# Patient Record
Sex: Male | Born: 1985
Health system: Southern US, Community
[De-identification: ages and names within clinical notes are randomized; demographics above are authoritative.]

## PROBLEM LIST (undated history)

## (undated) ENCOUNTER — Emergency Department (HOSPITAL_BASED_OUTPATIENT_CLINIC_OR_DEPARTMENT_OTHER): Admission: EM | Discharge status: Absent without leave | Payer: Self-pay | Source: Home / Self Care

## (undated) DIAGNOSIS — R079 Chest pain, unspecified: Secondary | ICD-10-CM

## (undated) DIAGNOSIS — R0602 Shortness of breath: Secondary | ICD-10-CM

## (undated) DIAGNOSIS — E119 Type 2 diabetes mellitus without complications: Secondary | ICD-10-CM

## (undated) DIAGNOSIS — F419 Anxiety disorder, unspecified: Secondary | ICD-10-CM

## (undated) DIAGNOSIS — G4700 Insomnia, unspecified: Secondary | ICD-10-CM

## (undated) DIAGNOSIS — E7800 Pure hypercholesterolemia, unspecified: Secondary | ICD-10-CM

## (undated) HISTORY — DX: Insomnia, unspecified: G47.00

## (undated) HISTORY — DX: Shortness of breath: R06.02

## (undated) HISTORY — PX: NO PAST SURGERIES: SHX2092

## (undated) HISTORY — DX: Chest pain, unspecified: R07.9

---

## 2012-11-10 ENCOUNTER — Encounter (HOSPITAL_BASED_OUTPATIENT_CLINIC_OR_DEPARTMENT_OTHER): Payer: Self-pay | Admitting: Family Medicine

## 2012-11-10 ENCOUNTER — Emergency Department (HOSPITAL_BASED_OUTPATIENT_CLINIC_OR_DEPARTMENT_OTHER)
Admission: EM | Admit: 2012-11-10 | Discharge: 2012-11-10 | Disposition: A | Discharge status: Home / Self Care | Payer: BC Managed Care – PPO | Attending: Emergency Medicine | Admitting: Emergency Medicine

## 2012-11-10 DIAGNOSIS — R0789 Other chest pain: Secondary | ICD-10-CM | POA: Insufficient documentation

## 2012-11-10 DIAGNOSIS — F17200 Nicotine dependence, unspecified, uncomplicated: Secondary | ICD-10-CM | POA: Insufficient documentation

## 2012-11-10 DIAGNOSIS — F419 Anxiety disorder, unspecified: Secondary | ICD-10-CM

## 2012-11-10 DIAGNOSIS — F411 Generalized anxiety disorder: Secondary | ICD-10-CM | POA: Insufficient documentation

## 2012-11-10 DIAGNOSIS — Z8639 Personal history of other endocrine, nutritional and metabolic disease: Secondary | ICD-10-CM | POA: Insufficient documentation

## 2012-11-10 DIAGNOSIS — Z862 Personal history of diseases of the blood and blood-forming organs and certain disorders involving the immune mechanism: Secondary | ICD-10-CM | POA: Insufficient documentation

## 2012-11-10 HISTORY — DX: Pure hypercholesterolemia, unspecified: E78.00

## 2012-11-10 HISTORY — DX: Anxiety disorder, unspecified: F41.9

## 2012-11-10 MED ORDER — LORAZEPAM 1 MG PO TABS: 1.0000 mg | ORAL_TABLET | Freq: Three times a day (TID) | ORAL | Status: DC | PRN

## 2012-11-10 MED ORDER — LORAZEPAM 1 MG PO TABS
1.0000 mg | ORAL_TABLET | Freq: Once | ORAL | Status: AC
Start: 2012-11-10 — End: 2012-11-10
  Administered 2012-11-10: 1 mg via ORAL
  Filled 2012-11-10: qty 1

## 2012-11-10 NOTE — ED Notes (Signed)
Pt sts "I felt like I was having a panic attack at work" then c/o chest tightness and shortness of breath. Pt sts he is out of anxiety med. Pt talking in complete sentences, nad noted.

## 2012-11-10 NOTE — ED Provider Notes (Signed)
History    CSN: [id number] Arrival date & time 11/10/12  1144  First MD Initiated Contact with Patient 11/10/12 1216     Chief Complaint  Patient presents with  . Shortness of Breath   (Consider location/radiation/quality/duration/timing/severity/associated sxs/prior Treatment) HPI Comments: Patient is a 27 year old male with history of high cholesterol and anxiety who presents today with one month of intermittent shortness of breath and chest tightness. He cannot identify any triggers for his shortness of breath and chest tightness. When it comes it usually lasts for approximately 30 minutes and resolve spontaneously. Day he was at work and his boss made him sit down. This improved his symptoms. He still complains of the sensation of chest heaviness. He has tried a course of antidepressants for 4 months and states he was feeling better, but never picked up his new prescription. No recent surgeries, long trips, history of cancer, he is not on any hormonal supplements, no prior history of DVT or PE.  The history is provided by the patient. No language interpreter was used.   Past Medical History  Diagnosis Date  . High cholesterol   . Anxiety    History reviewed. No pertinent past surgical history. No family history on file. History  Substance Use Topics  . Smoking status: Current Some Day Smoker  . Smokeless tobacco: Not on file  . Alcohol Use: Yes    Review of Systems  Constitutional: Negative for fever, chills and diaphoresis.  Respiratory: Positive for chest tightness and shortness of breath.   Cardiovascular: Negative for chest pain and leg swelling.  Gastrointestinal: Negative for nausea, vomiting and abdominal pain.  Psychiatric/Behavioral: The patient is nervous/anxious.   All other systems reviewed and are negative.    Allergies  Review of patient's allergies indicates no known allergies.  Home Medications   Current Outpatient Rx  Name  Route  Sig  Dispense   Refill  . ALPRAZOLAM PO   Oral   Take by mouth.          BP 138/82  Pulse 68  Temp(Src) 98.1 F (36.7 C) (Oral)  Resp 18  Ht 6' (1.829 m)  Wt 250 lb (113.399 kg)  BMI 33.9 kg/m2  SpO2 100% Physical Exam  Nursing note and vitals reviewed. Constitutional: He is oriented to person, place, and time. He appears well-developed and well-nourished. No distress.  HENT:  Head: Normocephalic and atraumatic.  Right Ear: External ear normal.  Left Ear: External ear normal.  Nose: Nose normal.  Eyes: Conjunctivae are normal.  Neck: Normal range of motion. No tracheal deviation present.  Cardiovascular: Normal rate, regular rhythm and normal heart sounds.   Pulmonary/Chest: Effort normal and breath sounds normal. No stridor.  Abdominal: Soft. He exhibits no distension. There is no tenderness.  Musculoskeletal: Normal range of motion.  Neurological: He is alert and oriented to person, place, and time.  Skin: Skin is warm and dry. He is not diaphoretic.  Psychiatric: He has a normal mood and affect. His behavior is normal.    ED Course  Procedures (including critical care time) Labs Reviewed - No data to display No results found.  Symptoms have improved, but not resolved after ativan.   1. Anxiety     MDM  Patient presents to the emergency department complaining of symptoms consistent with anxiety.  Patient has a history of same with similar episodes.  The patient is resting comfortably, in no apparent distress and symptoms have improved.  Labs, ECG and vital signs reviewed.  Stress reducing mechanisms discussed including caffeine intake.  Patient has been referred to psychiatric services for follow-up.  Discharged with a 3 day prescription for Ativan 1mg .  PERC negative. Discussed case with Dr. Bernette Mayers who agrees with plan. Patient / Family / Caregiver informed of clinical course, understand medical decision-making process, and agree with plan.      Mora Bellman,  PA-C 11/10/12 1330

## 2012-11-12 NOTE — ED Provider Notes (Signed)
Medical screening examination/treatment/procedure(s) were performed by non-physician practitioner and as supervising physician I was immediately available for consultation/collaboration.   Perry Ivanov B. Bernette Mayers, MD 11/12/12 1945

## 2012-11-16 ENCOUNTER — Encounter (HOSPITAL_BASED_OUTPATIENT_CLINIC_OR_DEPARTMENT_OTHER): Payer: Self-pay | Admitting: *Deleted

## 2012-11-16 ENCOUNTER — Emergency Department (HOSPITAL_BASED_OUTPATIENT_CLINIC_OR_DEPARTMENT_OTHER)
Admission: EM | Admit: 2012-11-16 | Discharge: 2012-11-16 | Disposition: A | Discharge status: Home / Self Care | Payer: BC Managed Care – PPO | Attending: Emergency Medicine | Admitting: Emergency Medicine

## 2012-11-16 DIAGNOSIS — R0602 Shortness of breath: Secondary | ICD-10-CM | POA: Insufficient documentation

## 2012-11-16 DIAGNOSIS — Z862 Personal history of diseases of the blood and blood-forming organs and certain disorders involving the immune mechanism: Secondary | ICD-10-CM | POA: Insufficient documentation

## 2012-11-16 DIAGNOSIS — F17200 Nicotine dependence, unspecified, uncomplicated: Secondary | ICD-10-CM | POA: Insufficient documentation

## 2012-11-16 DIAGNOSIS — Z8639 Personal history of other endocrine, nutritional and metabolic disease: Secondary | ICD-10-CM | POA: Insufficient documentation

## 2012-11-16 DIAGNOSIS — Z79899 Other long term (current) drug therapy: Secondary | ICD-10-CM | POA: Insufficient documentation

## 2012-11-16 DIAGNOSIS — R079 Chest pain, unspecified: Secondary | ICD-10-CM | POA: Insufficient documentation

## 2012-11-16 DIAGNOSIS — F419 Anxiety disorder, unspecified: Secondary | ICD-10-CM

## 2012-11-16 DIAGNOSIS — F411 Generalized anxiety disorder: Secondary | ICD-10-CM | POA: Insufficient documentation

## 2012-11-16 LAB — CBC WITH DIFFERENTIAL/PLATELET
Basophils Absolute: 0 10*3/uL (ref 0.0–0.1)
Basophils Relative: 0 % (ref 0–1)
Eosinophils Absolute: 0.5 10*3/uL (ref 0.0–0.7)
MCH: 30.1 pg (ref 26.0–34.0)
MCHC: 34.7 g/dL (ref 30.0–36.0)
Neutro Abs: 4.6 10*3/uL (ref 1.7–7.7)
Neutrophils Relative %: 52 % (ref 43–77)
Platelets: 253 10*3/uL (ref 150–400)
RDW: 11.6 % (ref 11.5–15.5)

## 2012-11-16 LAB — COMPREHENSIVE METABOLIC PANEL
AST: 20 U/L (ref 0–37)
Albumin: 4.2 g/dL (ref 3.5–5.2)
Alkaline Phosphatase: 84 U/L (ref 39–117)
BUN: 17 mg/dL (ref 6–23)
Chloride: 103 mEq/L (ref 96–112)
Creatinine, Ser: 1 mg/dL (ref 0.50–1.35)
Potassium: 4.2 mEq/L (ref 3.5–5.1)
Total Bilirubin: 0.4 mg/dL (ref 0.3–1.2)
Total Protein: 7.3 g/dL (ref 6.0–8.3)

## 2012-11-16 LAB — TROPONIN I: Troponin I: <0.3 ng/mL (ref <0.30)

## 2012-11-16 LAB — CK: Total CK: 211 U/L (ref 7–232)

## 2012-11-16 MED ORDER — ALPRAZOLAM 1 MG PO TABS: 1.0000 mg | ORAL_TABLET | Freq: Three times a day (TID) | ORAL | Status: DC | PRN

## 2012-11-16 NOTE — ED Provider Notes (Signed)
History    CSN: [id number] Arrival date & time 11/16/12  0002  First MD Initiated Contact with Patient 11/16/12 0124     Chief Complaint  Patient presents with  . Anxiety   (Consider location/radiation/quality/duration/timing/severity/associated sxs/prior Treatment) HPI This is a 27 year old male with a history of anxiety. He had previously been on an antidepressant for several months but has not taken this since March. He was seen here 6 days ago with complaints of anxiety, insomnia and intermittent chest pain and shortness of breath. He was diagnosed with anxiety and was discharged on Ativan. He states the Ativan has not improved his symptoms.   He is here now because he states that whenever he lies down to sleep he feels like his entire body is crumpling up. He states the symptoms are moderate to severe. This crumpling sensation also occurs when he yawns primarily located in the chest which she states feels like it is collapsing. He continues to have intermittent chest pain and shortness of breath. He states he is having some left-sided chest pain at this time, worse with palpation. He denies feeling depressed. He was previously on a cholesterol medicine but stopped taking it. He started taking it again 2 days ago.  He denies paresthesias, nausea, vomiting or diarrhea.  Past Medical History  Diagnosis Date  . High cholesterol   . Anxiety    History reviewed. No pertinent past surgical history. History reviewed. No pertinent family history. History  Substance Use Topics  . Smoking status: Current Some Day Smoker  . Smokeless tobacco: Not on file  . Alcohol Use: Yes    Review of Systems  All other systems reviewed and are negative.    Allergies  Review of patient's allergies indicates no known allergies.  Home Medications   Current Outpatient Rx  Name  Route  Sig  Dispense  Refill  . ALPRAZOLAM PO   Oral   Take by mouth.         Marland Kitchen LORazepam (ATIVAN) 1 MG tablet    Oral   Take 1 tablet (1 mg total) by mouth 3 (three) times daily as needed for anxiety.   15 tablet   0    BP 136/82  Pulse 75  Temp(Src) 98.1 F (36.7 C)  Resp 16  Ht 6' (1.829 m)  Wt 250 lb (113.399 kg)  BMI 33.9 kg/m2  SpO2 99%  Physical Exam General: Well-developed, well-nourished male in no acute distress; appearance consistent with age of record HENT: normocephalic, atraumatic Eyes: pupils equal round and reactive to light; extraocular muscles intact Neck: supple Heart: regular rate and rhythm Lungs: clear to auscultation bilaterally Chest: Left upper chest wall tenderness Abdomen: soft; nondistended; nontender Extremities: No deformity; full range of motion; pulses normal Neurologic: Awake, alert and oriented; motor function intact in all extremities and symmetric; no facial droop Skin: Warm and dry Psychiatric: Flat affect    ED Course  Procedures (including critical care time)   MDM   Nursing notes and vitals signs, including pulse oximetry, reviewed.  Summary of this visit's results, reviewed by myself:  Labs:  Results for orders placed during the hospital encounter of 11/16/12 (from the past 24 hour(s))  CBC WITH DIFFERENTIAL     Status: Abnormal   Collection Time    11/16/12  1:51 AM      Result Value Range   WBC 8.8  4.0 - 10.5 K/uL   RBC 5.15  4.22 - 5.81 MIL/uL   Hemoglobin 15.5  13.0 - 17.0 g/dL   HCT 16.1  09.6 - 04.5 %   MCV 86.8  78.0 - 100.0 fL   MCH 30.1  26.0 - 34.0 pg   MCHC 34.7  30.0 - 36.0 g/dL   RDW 40.9  81.1 - 91.4 %   Platelets 253  150 - 400 K/uL   Neutrophils Relative % 52  43 - 77 %   Neutro Abs 4.6  1.7 - 7.7 K/uL   Lymphocytes Relative 34  12 - 46 %   Lymphs Abs 3.0  0.7 - 4.0 K/uL   Monocytes Relative 8  3 - 12 %   Monocytes Absolute 0.7  0.1 - 1.0 K/uL   Eosinophils Relative 6 (*) 0 - 5 %   Eosinophils Absolute 0.5  0.0 - 0.7 K/uL   Basophils Relative 0  0 - 1 %   Basophils Absolute 0.0  0.0 - 0.1 K/uL   COMPREHENSIVE METABOLIC PANEL     Status: None   Collection Time    11/16/12  1:51 AM      Result Value Range   Sodium 139  135 - 145 mEq/L   Potassium 4.2  3.5 - 5.1 mEq/L   Chloride 103  96 - 112 mEq/L   CO2 26  19 - 32 mEq/L   Glucose, Bld 95  70 - 99 mg/dL   BUN 17  6 - 23 mg/dL   Creatinine, Ser 7.82  0.50 - 1.35 mg/dL   Calcium 9.9  8.4 - 95.6 mg/dL   Total Protein 7.3  6.0 - 8.3 g/dL   Albumin 4.2  3.5 - 5.2 g/dL   AST 20  0 - 37 U/L   ALT 30  0 - 53 U/L   Alkaline Phosphatase 84  39 - 117 U/L   Total Bilirubin 0.4  0.3 - 1.2 mg/dL   GFR calc non Af Amer >90  >90 mL/min   GFR calc Af Amer >90  >90 mL/min  CK     Status: None   Collection Time    11/16/12  1:51 AM      Result Value Range   Total CK 211  7 - 232 U/L  TROPONIN I     Status: None   Collection Time    11/16/12  1:51 AM      Result Value Range   Troponin I <0.30  <0.30 ng/mL    Date: 11/16/2012 2:00 AM  Rate: 61  Rhythm: normal sinus rhythm  QRS Axis: normal  Intervals: normal  ST/T Wave abnormalities: normal  Conduction Disutrbances: none  Narrative Interpretation: unremarkable  Comparison with previous EKG: rate is faster      There is no evidence of rhabdomyolysis or hepatic insult due to his anticholesterol medication. He was seen by his primary care physician the day after he was seen here and started on Zoloft. He was advised that Zoloft, like other antidepressants, can take several weeks to start working. He was advised that he needs to follow up with his primary care physician as the ED has only limited ability to treat anxiety.   Hanley Seamen, MD 11/16/12 [phone]

## 2012-11-16 NOTE — ED Notes (Signed)
MD at bedside. 

## 2012-11-16 NOTE — ED Notes (Signed)
Pt seen here last week for same DX anxiety , return today for same, nervous , insomnia and chest tightness with SOB

## 2012-11-16 NOTE — ED Notes (Signed)
Upon entering room to DC pt. Pt was sleeping soundly but awakened to voice.

## 2012-12-14 ENCOUNTER — Encounter (HOSPITAL_BASED_OUTPATIENT_CLINIC_OR_DEPARTMENT_OTHER): Payer: Self-pay | Admitting: Student

## 2012-12-14 ENCOUNTER — Emergency Department (HOSPITAL_BASED_OUTPATIENT_CLINIC_OR_DEPARTMENT_OTHER)
Admission: EM | Admit: 2012-12-14 | Discharge: 2012-12-14 | Disposition: A | Discharge status: Home / Self Care | Payer: BC Managed Care – PPO | Attending: Emergency Medicine | Admitting: Emergency Medicine

## 2012-12-14 DIAGNOSIS — R0602 Shortness of breath: Secondary | ICD-10-CM | POA: Insufficient documentation

## 2012-12-14 DIAGNOSIS — R110 Nausea: Secondary | ICD-10-CM | POA: Insufficient documentation

## 2012-12-14 DIAGNOSIS — Z79899 Other long term (current) drug therapy: Secondary | ICD-10-CM | POA: Insufficient documentation

## 2012-12-14 DIAGNOSIS — Z862 Personal history of diseases of the blood and blood-forming organs and certain disorders involving the immune mechanism: Secondary | ICD-10-CM | POA: Insufficient documentation

## 2012-12-14 DIAGNOSIS — R079 Chest pain, unspecified: Secondary | ICD-10-CM | POA: Insufficient documentation

## 2012-12-14 DIAGNOSIS — F411 Generalized anxiety disorder: Secondary | ICD-10-CM | POA: Insufficient documentation

## 2012-12-14 DIAGNOSIS — F17200 Nicotine dependence, unspecified, uncomplicated: Secondary | ICD-10-CM | POA: Insufficient documentation

## 2012-12-14 DIAGNOSIS — R42 Dizziness and giddiness: Secondary | ICD-10-CM | POA: Insufficient documentation

## 2012-12-14 DIAGNOSIS — R109 Unspecified abdominal pain: Secondary | ICD-10-CM | POA: Insufficient documentation

## 2012-12-14 DIAGNOSIS — Z8639 Personal history of other endocrine, nutritional and metabolic disease: Secondary | ICD-10-CM | POA: Insufficient documentation

## 2012-12-14 MED ORDER — GI COCKTAIL ~~LOC~~
30.0000 mL | Freq: Once | ORAL | Status: AC
  Administered 2012-12-14: 30 mL via ORAL
  Filled 2012-12-14: qty 30

## 2012-12-14 MED ORDER — OMEPRAZOLE 20 MG PO CPDR: 20.0000 mg | DELAYED_RELEASE_CAPSULE | Freq: Every day | ORAL | Status: DC

## 2012-12-14 NOTE — ED Notes (Signed)
MD at bedside. 

## 2012-12-14 NOTE — ED Provider Notes (Signed)
CSN: [id number]     Arrival date & time 12/14/12  2031 History     First MD Initiated Contact with Patient 12/14/12 2101     Chief Complaint  Patient presents with  . Chest Pain   (Consider location/radiation/quality/duration/timing/severity/associated sxs/prior Treatment) Patient is a 27 y.o. male presenting with chest pain.  Chest Pain Pain location:  L chest Pain quality: burning and pressure   Pain radiates to:  Does not radiate Pain severity:  Moderate Onset quality:  Sudden Duration:  1 hour Timing:  Constant Progression:  Unchanged Chronicity:  Recurrent Context: eating   Relieved by:  Nothing Worsened by:  Nothing tried Associated symptoms: abdominal pain, anxiety, dizziness, nausea and shortness of breath     Past Medical History  Diagnosis Date  . High cholesterol   . Anxiety    History reviewed. No pertinent past surgical history. History reviewed. No pertinent family history. History  Substance Use Topics  . Smoking status: Current Some Day Smoker  . Smokeless tobacco: Not on file  . Alcohol Use: Yes    Review of Systems  Respiratory: Positive for shortness of breath.   Cardiovascular: Positive for chest pain.  Gastrointestinal: Positive for nausea and abdominal pain.  Neurological: Positive for dizziness.  All other systems reviewed and are negative.    Allergies  Review of patient's allergies indicates no known allergies.  Home Medications   Current Outpatient Rx  Name  Route  Sig  Dispense  Refill  . busPIRone (BUSPAR) 10 MG tablet   Oral   Take 10 mg by mouth 2 (two) times daily.         Marland Kitchen ALPRAZolam (XANAX) 1 MG tablet   Oral   Take 1 tablet (1 mg total) by mouth 3 (three) times daily as needed for sleep or anxiety.   30 tablet   0   . ALPRAZOLAM PO   Oral   Take by mouth.         Marland Kitchen LORazepam (ATIVAN) 1 MG tablet   Oral   Take 1 tablet (1 mg total) by mouth 3 (three) times daily as needed for anxiety.   15 tablet   0    . sertraline (ZOLOFT) 50 MG tablet   Oral   Take 50 mg by mouth daily.          There were no vitals taken for this visit. Physical Exam  Nursing note and vitals reviewed. Constitutional: He is oriented to person, place, and time. He appears well-developed and well-nourished. No distress.  HENT:  Head: Normocephalic and atraumatic.  Mouth/Throat: Oropharynx is clear and moist.  Eyes: Conjunctivae are normal. Pupils are equal, round, and reactive to light. No scleral icterus.  Neck: Neck supple.  Cardiovascular: Normal rate, regular rhythm, normal heart sounds and intact distal pulses.   No murmur heard. Pulmonary/Chest: Effort normal and breath sounds normal. No stridor. No respiratory distress. He has no wheezes. He has no rales.  Abdominal: Soft. He exhibits no distension. There is no tenderness.  Musculoskeletal: Normal range of motion. He exhibits no edema.  Neurological: He is alert and oriented to person, place, and time.  Skin: Skin is warm and dry. No rash noted.  Psychiatric: His behavior is normal. His mood appears anxious.    ED Course   Procedures (including critical care time)  Labs Reviewed - No data to display No results found.  EKG - NSR, rate 63, normal axis, normal intervals, no ST/T changes, similar to prior.  1. Chest pain     MDM  27 yo male with hx of anxiety presenting with chest pain which started a few minutes after eating pizza.  Same thing has happened for past two days and each time after a meal.  He appeared anxious.  He has been seen a few times recently for anxiety, has been prescribed xanax from the ED and his PCP.  He thinks they help some, but he doesn't take them all the time.  Well appearing, no distress, but anxious.  Lungs clear.  His chest pain is consistent with GI source.  PERC negative.  EKG normal.  GI cocktail helped some.  DC'd with prescription for omeprazole and PCP follow up.    Candyce Churn, MD 12/15/12 [phone]

## 2012-12-14 NOTE — ED Notes (Signed)
Teaching done with Pt. On working out and taking meds he has been given.  Pt. Also educated on following up with his PMD

## 2012-12-14 NOTE — ED Notes (Signed)
Pt. Able to speak clear full sentences with no distress noted.  Pt. Seems anxious and will con't. To repeat about the Chest pain after eating.  Pt. In no resp. Distress.

## 2012-12-14 NOTE — ED Notes (Signed)
Pt in with c/o central chest pain after eating

## 2012-12-14 NOTE — ED Notes (Signed)
Pt. Reports to RN he is still having "explosive pain in my chest"

## 2012-12-14 NOTE — ED Notes (Signed)
Pt. Lying on stretcher watching TV and still reports he has discomfort.  Pt. In no resp. Distress and able to move the L arm and shoulder WNL.

## 2013-01-16 ENCOUNTER — Encounter: Payer: Self-pay | Admitting: Cardiology

## 2013-01-16 ENCOUNTER — Encounter: Payer: Self-pay | Admitting: *Deleted

## 2013-01-16 DIAGNOSIS — R0602 Shortness of breath: Secondary | ICD-10-CM | POA: Insufficient documentation

## 2013-01-16 DIAGNOSIS — G4700 Insomnia, unspecified: Secondary | ICD-10-CM | POA: Insufficient documentation

## 2013-01-16 DIAGNOSIS — R079 Chest pain, unspecified: Secondary | ICD-10-CM | POA: Insufficient documentation

## 2013-01-16 DIAGNOSIS — E7800 Pure hypercholesterolemia, unspecified: Secondary | ICD-10-CM | POA: Insufficient documentation

## 2013-01-16 DIAGNOSIS — F419 Anxiety disorder, unspecified: Secondary | ICD-10-CM | POA: Insufficient documentation

## 2013-01-17 ENCOUNTER — Ambulatory Visit: Payer: BC Managed Care – PPO | Admitting: Cardiology

## 2014-07-10 ENCOUNTER — Ambulatory Visit (INDEPENDENT_AMBULATORY_CARE_PROVIDER_SITE_OTHER): Payer: BLUE CROSS/BLUE SHIELD

## 2014-07-10 ENCOUNTER — Ambulatory Visit (INDEPENDENT_AMBULATORY_CARE_PROVIDER_SITE_OTHER): Payer: BLUE CROSS/BLUE SHIELD | Admitting: Emergency Medicine

## 2014-07-10 VITALS — BP 130/80 | HR 82 | Temp 98.0°F | Resp 18 | Ht 71.5 in | Wt 252.4 lb

## 2014-07-10 DIAGNOSIS — M79671 Pain in right foot: Secondary | ICD-10-CM

## 2014-07-10 MED ORDER — CLONAZEPAM 1 MG PO TABS: 1.0000 mg | ORAL_TABLET | Freq: Two times a day (BID) | ORAL | Status: DC

## 2014-07-10 MED ORDER — TRAZODONE HCL 150 MG PO TABS: 150.0000 mg | ORAL_TABLET | Freq: Every day | ORAL | Status: DC

## 2014-07-10 NOTE — Patient Instructions (Addendum)
Please call Dr. Bicknell SinkBraden at [phone] for an appointment.  [facility] Policy for Prescribing Controlled Substances (Revised 03/2012) 1. Prescriptions for controlled substances will be filled by ONE provider at [facility] with whom you have established and developed a plan for your care, including follow-up. 2. You are encouraged to schedule an appointment with your prescriber at our appointment center for follow-up visits whenever possible. If you request a prescription for the controlled substance while at [facility] for an acute problem (with someone other than your regular prescriber), you MAY be given a ONE-TIME prescription for a 30-day supply of the controlled substance, to allow time for you to return to see your regular prescriber for additional prescriptions.Achilles Tendinitis Achilles tendinitis is inflammation of the tough, cord-like band that attaches the lower muscles of your leg to your heel (Achilles tendon). It is usually caused by overusing the tendon and joint involved.  CAUSES Achilles tendinitis can happen because of:  A sudden increase in exercise or activity (such as running).  Doing the same exercises or activities (such as jumping) over and over.  Not warming up calf muscles before exercising.  Exercising in shoes that are worn out or not made for exercise.  Having arthritis or a bone growth on the back of the heel bone. This can rub against the tendon and hurt the tendon. SIGNS AND SYMPTOMS The most common symptoms are:  Pain in the back of the leg, just above the heel. The pain usually gets worse with exercise and better with rest.  Stiffness or soreness in the back of the leg, especially in the morning.  Swelling of the skin over the Achilles tendon.  Trouble standing on tiptoe. Sometimes, an Achilles tendon tears (ruptures). Symptoms of an Achilles tendon rupture can include:  Sudden, severe pain in the back of the leg.  Trouble putting weight on the foot or walking  normally. DIAGNOSIS Achilles tendinitis will be diagnosed based on symptoms and a physical examination. An X-ray may be done to check if another condition is causing your symptoms. An MRI may be ordered if your health care provider suspects you may have completely torn your tendon, which is called an Achilles tendon rupture.  TREATMENT  Achilles tendinitis usually gets better over time. It can take weeks to months to heal completely. Treatment focuses on treating the symptoms and helping the injury heal. HOME CARE INSTRUCTIONS   Rest your Achilles tendon and avoid activities that cause pain.  Apply ice to the injured area:  Put ice in a plastic bag.  Place a towel between your skin and the bag.  Leave the ice on for 20 minutes, 2-3 times a day  Try to avoid using the tendon (other than gentle range of motion) while the tendon is painful. Do not resume use until instructed by your health care provider. Then begin use gradually. Do not increase use to the point of pain. If pain does develop, decrease use and continue the above measures. Gradually increase activities that do not cause discomfort until you achieve normal use.  Do exercises to make your calf muscles stronger and more flexible. Your health care provider or physical therapist can recommend exercises for you to do.  Wrap your ankle with an elastic bandage or other wrap. This can help keep your tendon from moving too much. Your health care provider will show you how to wrap your ankle correctly.  Only take over-the-counter or prescription medicines for pain, discomfort, or fever as directed by your health care  provider. SEEK MEDICAL CARE IF:   Your pain and swelling increase or pain is uncontrolled with medicines.  You develop new, unexplained symptoms or your symptoms get worse.  You are unable to move your toes or foot.  You develop warmth and swelling in your foot.  You have an unexplained temperature. MAKE SURE YOU:    Understand these instructions.  Will watch your condition.  Will get help right away if you are not doing well or get worse. Document Released: 01/27/2005 Document Revised: 02/07/2013 Document Reviewed: 11/29/2012 [facility] Patient Information 2015 [place], [place]. This information is not intended to replace advice given to you by your health care provider. Make sure you discuss any questions you have with your health care provider.

## 2014-07-10 NOTE — Progress Notes (Signed)
   This chart was scribed for Collene GobbleSteven A Daub, MD by Tonye RoyaltyJoshua Chen, ED Scribe. This patient was seen in room 14 and the patient's care was started at 12:33 PM.   Subjective:    Patient ID: Perry Li, male    DOB: 03/30/1986, 29 y.o.   MRN: [id number]  HPI  HPI Comments: Perry Li is a 29 y.o. male who presents to the [facility] complaining of ongoing anxiety that began 2 years ago. He states he has been going to General Medicine clinic near here and has also been [place]. He currently only uses Trazodone 150mg  for sleeping, Clonopin 1mg  BID, and prescription medication for nasal congestion. He states he did use Zoloft for a few months but felt like it was not helping. He was also previously on Buspar, Ativan, and Baclofen for headache. He states he had MRI for headache without significant findings. He states he has never seen a therapist of psychiatrist. He states anxiety is triggered at times by certain situations. He states he went to ED twice this year for chest pressure with negative workup and diagnosis of anxiety. He states he has panic attacks. He denies any major event triggering the beginning of his anxiety. He states he often worries about little things. 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 He lives at home; he denies any problems with that but states he would like to move out. He lives with 2 sisters, a nephew, and his parents at home. He states he goes to the gym 4-5 times a week, which improves his anxiety. [place].   He also complains of pain to right heel with onset 1.5 months. He states he has been evaluated for it but did not have x-ray.   Review of Systems  Musculoskeletal:       Right heel pain  Psychiatric/Behavioral: The patient is nervous/anxious.        Objective:   Physical Exam   CONSTITUTIONAL: Well developed/well nourished HEAD: Normocephalic/atraumatic EYES:  EOMI/PERRL ENMT: Mucous membranes moist NECK: supple no meningeal signs, thyroid gland felt normal SPINE/BACK:entire spine nontender CV: S1/S2 noted, no murmurs/rubs/gallops noted LUNGS: Lungs are clear to auscultation bilaterally, no apparent distress ABDOMEN: soft, nontender, no rebound or guarding, bowel sounds noted throughout abdomen GU:no cva tenderness NEURO: Pt is awake/alert/appropriate, moves all extremitiesx4.  No facial droop.   EXTREMITIES: pulses normal/equal, full ROM, right heel tenderness over the achilles attachment, no calf tenderness, no defect in the muscle SKIN: warm, color normal PSYCH:  alert and oriented to situation, somewhat depressed but alert and cooperative Zung anxiety index was 55 which is minimal to moderate anxiety. Beck depression was an 8 [facility] reading (PRIMARY) by  Dr.Daub heel films are normal.     Assessment & Plan:   Patient advised to make an appointment to see Dr. [place] SinkBraden psychiatrist for evaluation. I refilled his trazodone to take 150 at night and he will be on Klonopin 1 mg twice a day. I will see the patient back in one month if he has not had his appointment scheduled already.I personally performed the services described in this documentation, which was scribed in my presence. The recorded information has been reviewed and is accurate.

## 2014-08-09 ENCOUNTER — Ambulatory Visit (INDEPENDENT_AMBULATORY_CARE_PROVIDER_SITE_OTHER): Payer: BLUE CROSS/BLUE SHIELD | Admitting: Family Medicine

## 2014-08-09 VITALS — BP 120/80 | HR 59 | Temp 97.6°F | Resp 16 | Ht 71.5 in | Wt 246.0 lb

## 2014-08-09 DIAGNOSIS — F411 Generalized anxiety disorder: Secondary | ICD-10-CM

## 2014-08-09 DIAGNOSIS — M7661 Achilles tendinitis, right leg: Secondary | ICD-10-CM | POA: Diagnosis not present

## 2014-08-09 MED ORDER — CLONAZEPAM 1 MG PO TABS
1.0000 mg | ORAL_TABLET | Freq: Two times a day (BID) | ORAL | Status: DC
Start: 2014-08-09 — End: 2014-10-07

## 2014-08-09 NOTE — Progress Notes (Signed)
[facility] [address], [place] [address] [phone]  Date:  08/09/2014   Name:  Perry Li   DOB:  08-30-1985   MRN:  [id number]  PCP:  Quentin Mulling, MD    Chief Complaint: Medication Refill and Strained Achilles Tendon   History of Present Illness:  Perry Li is a 29 y.o. very pleasant male patient who presents with the following:  He needs to get a refill of his medication -klonopin that he takes BID for anxiety He has noted trouble with his right achilles for a couple of months. Insidious onset- he was doing some calf raises with weights and thinks he hurt his tendon. He had a negative x-ray last month.  The heel is not getting worse but also does not seem to be getting better He would like to lift weights more aggressively but is afraid he will hurt the tendon He has a long history of anxiety and is taking zoloft, buspar and klonopin, as well as trazodone for sleep.  He feels that he is doing pretty well with this regimen but would like to follow through with seeing a therpist Patient Active Problem List   Diagnosis Date Noted  . High cholesterol   . Anxiety   . Chest pain   . SOB (shortness of breath)   . Insomnia     Past Medical History  Diagnosis Date  . High cholesterol   . Anxiety   . Chest pain   . SOB (shortness of breath)   . Insomnia     No past surgical history on file.  History  Substance Use Topics  . Smoking status: Never Smoker   . Smokeless tobacco: Not on file  . Alcohol Use: 0.0 oz/week    0 Standard drinks or equivalent per week    No family history on file.  No Known Allergies  Medication list has been reviewed and updated.  Current Outpatient Prescriptions on File Prior to Visit  Medication Sig Dispense Refill  . clonazePAM (KLONOPIN) 1 MG tablet Take 1 tablet (1 mg total) by mouth 2 (two) times daily. 30 tablet 1  . traZODone (DESYREL) 150 MG tablet Take 1 tablet (150 mg total) by mouth  at bedtime. 15 tablet 1  . ALPRAZolam (XANAX) 1 MG tablet Take 1 tablet (1 mg total) by mouth 3 (three) times daily as needed for sleep or anxiety. (Patient not taking: Reported on 07/10/2014) 30 tablet 0  . ALPRAZOLAM PO Take by mouth.    . busPIRone (BUSPAR) 10 MG tablet Take 10 mg by mouth 2 (two) times daily.    Marland Kitchen LORazepam (ATIVAN) 1 MG tablet Take 1 tablet (1 mg total) by mouth 3 (three) times daily as needed for anxiety. (Patient not taking: Reported on 07/10/2014) 15 tablet 0  . sertraline (ZOLOFT) 50 MG tablet Take 50 mg by mouth daily.     No current facility-administered medications on file prior to visit.    Review of Systems:  As per HPI- otherwise negative.   Physical Examination: Filed Vitals:   08/09/14 1344  BP: 120/80  Pulse: 59  Temp: 97.6 F (36.4 C)  Resp: 16   Filed Vitals:   08/09/14 1344  Height: 5' 11.5" (1.816 m)  Weight: 246 lb (111.585 kg)   Body mass index is 33.84 kg/(m^2). Ideal Body Weight: Weight in (lb) to have BMI = 25: 181.4  GEN: WDWN, NAD, Non-toxic, A & O x 3, looks well,  Overweight/  muscular build HEENT: Atraumatic, Normocephalic. Neck supple. No masses, No LAD. Ears and Nose: No external deformity. CV: RRR, No M/G/R. No JVD. No thrill. No extra heart sounds. PULM: CTA B, no wheezes, crackles, rhonchi. No retractions. No resp. distress. No accessory muscle use. EXTR: No c/c/e NEURO Normal gait.  PSYCH: Normally interactive. Conversant. Not depressed or anxious appearing.  Calm demeanor.  Right heel: achilles is normal to exam. No hypertrophy.  He notes mild tenderness with stretch of achilles.  No evidence of rupture.  Right achilles is tighter compared with left.  Foot and ankle otherwise normal    Assessment and Plan: Achilles tendinitis of right lower extremity - Plan: Ambulatory referral to Sports Medicine  Anxiety state - Plan: clonazePAM (KLONOPIN) 1 MG tablet  Refilled his klonopin. He plans to see me in 2 months for  follow-up. Reminded of controlled substance policy Will refer to sports med for his tendon  Signed Abbe AmsterdamJessica Mickenzie Stolar, MD

## 2014-08-09 NOTE — Patient Instructions (Signed)
We will refer you to sports medicine to look at your achilles tendon.   Continue to use the klonopin as needed but remember this can be habit forming so use it only when needed.    Please call Dr. Aiea SinkBraden at [phone] for an appointment  Remember that we do ask you to stick with one provider for your controlled substances- this can be the provider of your choice.

## 2014-08-15 ENCOUNTER — Ambulatory Visit (INDEPENDENT_AMBULATORY_CARE_PROVIDER_SITE_OTHER): Payer: BLUE CROSS/BLUE SHIELD | Admitting: Family Medicine

## 2014-08-15 ENCOUNTER — Encounter: Payer: Self-pay | Admitting: Family Medicine

## 2014-08-15 VITALS — BP 138/82 | HR 60 | Ht 72.0 in | Wt 246.0 lb

## 2014-08-15 DIAGNOSIS — M7661 Achilles tendinitis, right leg: Secondary | ICD-10-CM

## 2014-08-15 MED ORDER — NITROGLYCERIN 0.2 MG/HR TD PT24: MEDICATED_PATCH | TRANSDERMAL | Status: DC

## 2014-08-15 NOTE — Patient Instructions (Signed)
You have strained your achilles. Tylenol or aleve as needed for pain Start with theraband strengthening exercises 3 sets of 10 once a day. When your pain is 1-2 out of 10 you can start doing 2 legged calf raises and hopefully eventually single leg calf raises. Ice bucket 10-15 minutes at end of day - can ice 3-4 times a day. Avoid uneven ground, hills as much as possible. Heel lifts in shoes for next 6 weeks. Orthotics (like dr. Jari Sportsmanscholls active series) may be helpful. Physical therapy is an option but people typically do well with the home exercises. Nitro patches 1/4 of 0.2mg /hr patch and change daily Follow up with me in 6 weeks for reevaluation.

## 2014-08-15 NOTE — Progress Notes (Signed)
PCP: HOOPER,JEFFREY C, MD  Subjective:   HPI: Patient is a 29 y.o. male here for right achilles pain.  Patient reports about 2 months ago he recalls doing calf push-downs of about 240 pounds - did a couple sets and developed pain posterior heel into calf the next day. No acute injury. Some initial swelling. No bruising. No prior issues. Tried wrapping only.  Past Medical History  Diagnosis Date  . High cholesterol   . Anxiety   . Chest pain   . SOB (shortness of breath)   . Insomnia     Current Outpatient Prescriptions on File Prior to Visit  Medication Sig Dispense Refill  . clonazePAM (KLONOPIN) 1 MG tablet Take 1 tablet (1 mg total) by mouth 2 (two) times daily. 60 tablet 1  . traZODone (DESYREL) 150 MG tablet Take 1 tablet (150 mg total) by mouth at bedtime. 15 tablet 1  . busPIRone (BUSPAR) 10 MG tablet Take 10 mg by mouth 2 (two) times daily.    . sertraline (ZOLOFT) 50 MG tablet Take 50 mg by mouth daily.     No current facility-administered medications on file prior to visit.    No past surgical history on file.  No Known Allergies  History   Social History  . Marital Status: Single    Spouse Name: N/A  . Number of Children: N/A  . Years of Education: N/A   Occupational History  . Not on file.   Social History Main Topics  . Smoking status: Never Smoker   . Smokeless tobacco: Not on file  . Alcohol Use: 0.0 oz/week    0 Standard drinks or equivalent per week  . Drug Use: No  . Sexual Activity: Not on file   Other Topics Concern  . Not on file   Social History Narrative    No family history on file.  BP 138/82 mmHg  Pulse 60  Ht 6' (1.829 m)  Wt 246 lb (111.585 kg)  BMI 33.36 kg/m2  Review of Systems: See HPI above.    Objective:  Physical Exam:  Gen: NAD  Right foot/ankle: No gross deformity, swelling, ecchymoses FROM with pain on calf raise, plantar flexion. TTP achilles about 2 cm proximal to insertion on calcaneus. Negative  ant drawer and talar tilt.   Negative syndesmotic compression. Thompsons test negative. NV intact distally.    Assessment & Plan:  1. Right achilles strain, tendinopathy - bedside ultrasound without evidence of tear.  Heel lifts, shown home exercise program and how to progress this.  Icing, avoid uneven surfaces, hills.  Consider physical therapy, orthotics.  He would like to try nitro patches - discussed risks of headaches, skin irritation.  F/u in 6 weeks.

## 2014-08-15 NOTE — Assessment & Plan Note (Signed)
And strain - bedside ultrasound without evidence of tear.  Heel lifts, shown home exercise program and how to progress this.  Icing, avoid uneven surfaces, hills.  Consider physical therapy, orthotics.  He would like to try nitro patches - discussed risks of headaches, skin irritation.  F/u in 6 weeks.

## 2014-09-26 ENCOUNTER — Ambulatory Visit: Payer: BLUE CROSS/BLUE SHIELD | Admitting: Family Medicine

## 2014-10-03 ENCOUNTER — Ambulatory Visit (INDEPENDENT_AMBULATORY_CARE_PROVIDER_SITE_OTHER): Payer: BLUE CROSS/BLUE SHIELD | Admitting: Family Medicine

## 2014-10-03 ENCOUNTER — Encounter: Payer: Self-pay | Admitting: Family Medicine

## 2014-10-03 VITALS — BP 116/69 | HR 66 | Ht 72.0 in | Wt 245.0 lb

## 2014-10-03 DIAGNOSIS — M7661 Achilles tendinitis, right leg: Secondary | ICD-10-CM | POA: Diagnosis not present

## 2014-10-07 ENCOUNTER — Ambulatory Visit: Payer: BLUE CROSS/BLUE SHIELD | Admitting: Family Medicine

## 2014-10-07 ENCOUNTER — Ambulatory Visit (INDEPENDENT_AMBULATORY_CARE_PROVIDER_SITE_OTHER): Payer: BLUE CROSS/BLUE SHIELD | Admitting: Family Medicine

## 2014-10-07 VITALS — BP 112/70 | HR 86 | Temp 97.5°F | Resp 17 | Ht 71.5 in | Wt 264.0 lb

## 2014-10-07 DIAGNOSIS — G4700 Insomnia, unspecified: Secondary | ICD-10-CM

## 2014-10-07 DIAGNOSIS — R0981 Nasal congestion: Secondary | ICD-10-CM

## 2014-10-07 DIAGNOSIS — F411 Generalized anxiety disorder: Secondary | ICD-10-CM

## 2014-10-07 MED ORDER — TRAZODONE HCL 150 MG PO TABS: 150.0000 mg | ORAL_TABLET | Freq: Every day | ORAL | Status: DC

## 2014-10-07 MED ORDER — CLONAZEPAM 1 MG PO TABS
1.0000 mg | ORAL_TABLET | Freq: Two times a day (BID) | ORAL | Status: DC
Start: 2014-10-07 — End: 2015-01-15

## 2014-10-07 NOTE — Progress Notes (Signed)
[facility] [address] [place] [address] [phone]  Date:  10/07/2014   Name:  Perry Li   DOB:  08/18/1985   MRN:  [id number]  PCP:  Quentin Mulling, MD    Chief Complaint: Medication Refill   History of Present Illness:  Perry Li is a 29 y.o. very pleasant male patient who presents with the following:  Here today to discuss his medications.  He has been a pt of Dr. Verl Dicker Part of my HPI from our last visit in April:  He has a long history of anxiety and is taking zoloft, buspar and klonopin, as well as trazodone for sleep. He feels that he is doing pretty well with this regimen but would like to follow through with seeing a therapist  His achilles is much better with the nitroglycerin patches. He also reports that he is not taking buspar and has not taken it in "a while."  He is not taking zoloft any longer.   Right now he is only taking klonopin and trazadone. He has been off buspar and zoloft for about 4 months. He has not noted much of a difference since he stopped these medications.   He will notice that on certain days he feels heaviness in his chest, has a hard time breathing.  When he has his sx taking the klonpin will help. He notes that sometimes his anxiety seems to heighten, although he does not really have panic attacks.   He has not noted any situations that make him worse, except for being in a crowd of people will make him feel nervous.    He does sleep well with trazodone.  He does not feel at all that he is depressed.  He tried prozac but it did not seem to help.  "I'm pretty much happy with my life."  He does feel like he has some anxiety most days, but he tries to distract himself with activities.  He has not yet seen a therapist.    He has also used some sort of mediation for persistent nasal congestion in the past- nasal sprays did not seem to help but some sort of daily tablet did.  Was not singulari  Patient  Active Problem List   Diagnosis Date Noted  . Right Achilles tendinitis 08/15/2014  . High cholesterol   . Anxiety   . Chest pain   . SOB (shortness of breath)   . Insomnia     Past Medical History  Diagnosis Date  . High cholesterol   . Anxiety   . Chest pain   . SOB (shortness of breath)   . Insomnia     No past surgical history on file.  History  Substance Use Topics  . Smoking status: Never Smoker   . Smokeless tobacco: Not on file  . Alcohol Use: 0.0 oz/week    0 Standard drinks or equivalent per week    No family history on file.  No Known Allergies  Medication list has been reviewed and updated.  Current Outpatient Prescriptions on File Prior to Visit  Medication Sig Dispense Refill  . clonazePAM (KLONOPIN) 1 MG tablet Take 1 tablet (1 mg total) by mouth 2 (two) times daily. 60 tablet 1  . traZODone (DESYREL) 150 MG tablet Take 1 tablet (150 mg total) by mouth at bedtime. 15 tablet 1  . busPIRone (BUSPAR) 10 MG tablet Take 10 mg by mouth 2 (two) times daily.    Marland Kitchen  nitroGLYCERIN (NITRO-DUR) 0.2 mg/hr patch Apply 1/4th patch to affected tendon, change daily (Patient not taking: Reported on 10/07/2014) 30 patch 1  . sertraline (ZOLOFT) 50 MG tablet Take 50 mg by mouth daily.     No current facility-administered medications on file prior to visit.    Review of Systems:  As per HPI- otherwise negative.   Physical Examination: Filed Vitals:   10/07/14 1501  BP: 112/70  Pulse: 86  Temp: 97.5 F (36.4 C)  Resp: 17   Filed Vitals:   10/07/14 1501  Height: 5' 11.5" (1.816 m)  Weight: 264 lb (119.75 kg)   Body mass index is 36.31 kg/(m^2). Ideal Body Weight: Weight in (lb) to have BMI = 25: 181.4  GEN: WDWN, NAD, Non-toxic, A & O x 3, nasal congestion HEENT: Atraumatic, Normocephalic. Neck supple. No masses, No LAD. Ears and Nose: No external deformity. CV: RRR, No M/G/R. No JVD. No thrill. No extra heart sounds. PULM: CTA B, no wheezes, crackles,  rhonchi. No retractions. No resp. distress. No accessory muscle use. ABD: S, NT, ND, +BS. No rebound. No HSM. EXTR: No c/c/e NEURO Normal gait.  PSYCH: Normally interactive. Conversant. Not depressed or anxious appearing.  Calm demeanor.    Assessment and Plan: Anxiety state - Plan: clonazePAM (KLONOPIN) 1 MG tablet  Insomnia - Plan: traZODone (DESYREL) 150 MG tablet  Nasal congestion  Discussed his anxiety in detail.  Suggested that we start paxil or another SSRI. However he feels that he has used a lot of these in the past and they have not helped.  Discussed my concern that he may develop tolerance over time to his benzos and may require higher doses/ be unable to stop.  He will reconsider seeing a therapist, and will try and use his klonopin as little as possible  He will see me in 6 months, sooner if needed He has used what sounds like OTC antihistamine in the past with success- suggested that he try this for his nasal congestion  Signed Abbe AmsterdamJessica Copland, MD

## 2014-10-07 NOTE — Progress Notes (Signed)
PCP: HOOPER,JEFFREY C, MD  Subjective:   HPI: Patient is a 29 y.o. male here for right achilles pain.  4/14: Patient reports about 2 months ago he recalls doing calf push-downs of about 240 pounds - did a couple sets and developed pain posterior heel into calf the next day. No acute injury. Some initial swelling. No bruising. No prior issues. Tried wrapping only.  6/2: Patient reports he's about 80% improved. Has been doing home exercises, using heel lifts, icing. Tolerating nitro patches. Able to do squats without a problem. No other complaints.  Past Medical History  Diagnosis Date  . High cholesterol   . Anxiety   . Chest pain   . SOB (shortness of breath)   . Insomnia     Current Outpatient Prescriptions on File Prior to Visit  Medication Sig Dispense Refill  . busPIRone (BUSPAR) 10 MG tablet Take 10 mg by mouth 2 (two) times daily.    . clonazePAM (KLONOPIN) 1 MG tablet Take 1 tablet (1 mg total) by mouth 2 (two) times daily. 60 tablet 1  . nitroGLYCERIN (NITRO-DUR) 0.2 mg/hr patch Apply 1/4th patch to affected tendon, change daily 30 patch 1  . sertraline (ZOLOFT) 50 MG tablet Take 50 mg by mouth daily.    . traZODone (DESYREL) 150 MG tablet Take 1 tablet (150 mg total) by mouth at bedtime. 15 tablet 1   No current facility-administered medications on file prior to visit.    No past surgical history on file.  No Known Allergies  History   Social History  . Marital Status: Single    Spouse Name: N/A  . Number of Children: N/A  . Years of Education: N/A   Occupational History  . Not on file.   Social History Main Topics  . Smoking status: Never Smoker   . Smokeless tobacco: Not on file  . Alcohol Use: 0.0 oz/week    0 Standard drinks or equivalent per week  . Drug Use: No  . Sexual Activity: Not on file   Other Topics Concern  . Not on file   Social History Narrative    No family history on file.  BP 116/69 mmHg  Pulse 66  Ht 6' (1.829 m)   Wt 245 lb (111.131 kg)  BMI 33.22 kg/m2  Review of Systems: See HPI above.    Objective:  Physical Exam:  Gen: NAD  Right foot/ankle: No gross deformity, swelling, ecchymoses FROM without pain on calf raise, plantar flexion. No longer with TTP achilles about 2 cm proximal to insertion on calcaneus. Negative ant drawer and talar tilt.   Negative syndesmotic compression. Thompsons test negative. NV intact distally.    Assessment & Plan:  1. Right achilles strain, tendinopathy - clinically improved and no tenderness now.  Continue home exercises, nitro patches, heel lifts for 6 more weeks.  F/u then or prn.

## 2014-10-07 NOTE — Assessment & Plan Note (Signed)
Right achilles strain, tendinopathy - clinically improved and no tenderness now.  Continue home exercises, nitro patches, heel lifts for 6 more weeks.  F/u then or prn.

## 2014-10-07 NOTE — Patient Instructions (Addendum)
Continue your trazodone at night for sleep Please think about seeing a therapist to work on other techniques to manage anxiety Continue to use the klonopin as needed, but remember it is habit forming so use as little as you can.  If you would like to set up your mychart account you can send me a message when you need more medication I do need to see you every 6 months or so to prescribe your controlled substance (klonpin)   Try an OTC antihistamine such as generic claritin, zyrtec or allegra for your nose.

## 2015-01-14 ENCOUNTER — Telehealth: Payer: Self-pay

## 2015-01-14 DIAGNOSIS — F411 Generalized anxiety disorder: Secondary | ICD-10-CM

## 2015-01-14 NOTE — Telephone Encounter (Signed)
Pt is needing a refill on his klonopin   Best number [phone]

## 2015-01-15 MED ORDER — CLONAZEPAM 1 MG PO TABS: 1.0000 mg | ORAL_TABLET | Freq: Two times a day (BID) | ORAL | Status: DC

## 2015-01-17 ENCOUNTER — Telehealth: Payer: Self-pay

## 2015-01-17 NOTE — Telephone Encounter (Signed)
Rx called to [facility].

## 2015-01-17 NOTE — Telephone Encounter (Signed)
Pt called and was informed Rx Refill has been approved.  [phone]

## 2015-04-11 ENCOUNTER — Ambulatory Visit (INDEPENDENT_AMBULATORY_CARE_PROVIDER_SITE_OTHER): Payer: BLUE CROSS/BLUE SHIELD | Admitting: Family Medicine

## 2015-04-11 VITALS — BP 126/72 | HR 88 | Temp 98.6°F | Resp 14 | Ht 71.5 in | Wt 267.0 lb

## 2015-04-11 DIAGNOSIS — F411 Generalized anxiety disorder: Secondary | ICD-10-CM | POA: Diagnosis not present

## 2015-04-11 DIAGNOSIS — F419 Anxiety disorder, unspecified: Secondary | ICD-10-CM | POA: Diagnosis not present

## 2015-04-11 DIAGNOSIS — K4090 Unilateral inguinal hernia, without obstruction or gangrene, not specified as recurrent: Secondary | ICD-10-CM | POA: Diagnosis not present

## 2015-04-11 DIAGNOSIS — N50819 Testicular pain, unspecified: Secondary | ICD-10-CM

## 2015-04-11 MED ORDER — CLONAZEPAM 1 MG PO TABS: 1.0000 mg | ORAL_TABLET | Freq: Two times a day (BID) | ORAL | Status: DC

## 2015-04-11 NOTE — Progress Notes (Signed)
Subjective:  This chart was scribed for Norberto Sorenson, MD by Stann Ore, Medical Scribe. This patient was seen in Room 3 and the patient's care was started 12:22 PM.   Patient ID: Perry Li, male    DOB: Dec 19, 1985, 29 y.o.   MRN: [id number] Chief Complaint  Patient presents with  . Groin Pain    Was lifting at the gym about 4 months ago and felt a pain in his groin. States he still has pain and pressure in the groin area  . Medication Refill    Klonopin    HPI Perry Li is a 29 y.o. male who presents to [facility] complaining of gradual onset groin pain.  Groin Pain Pt states that he was consistently going to the gym doing squats 4 months ago. He then took a break to go on vacation with his family. After he returned from the vacation, he resumed going to the gym and immediately did squats of 335 lbs, he felt pressure and pain in the center of his groin around the scrotum. He tried to lift again 1.5 week later, but the moment he applied pressure, the pain in the area returned. He mentions that going to the gym is his anxiety relief. He tried to come in earlier, but with work, he didn't have time.   He also notes when he tries to hold in his urine, he noticed pain around the same area. When he applies pressure with bowel movements, this also causes pain in the same area. He denies urinary symptoms. He denies feeling any hernia bulges.   Medications He's on klonopin for anxiety. He's been trying to cut back and down on the klonopin.  He takes trazodone for insomnia. He denies any complications with his medications.  He's never been to counseling for anxiety.   Past Medical History  Diagnosis Date  . High cholesterol   . Anxiety   . Chest pain   . SOB (shortness of breath)   . Insomnia    Prior to Admission medications   Medication Sig Start Date End Date Taking? Authorizing Provider  clonazePAM (KLONOPIN) 1 MG tablet Take 1 tablet (1 mg total) by mouth 2 (two) times daily.  01/15/15  Yes Gwenlyn Found Copland, MD  traZODone (DESYREL) 150 MG tablet Take 1 tablet (150 mg total) by mouth at bedtime. 10/07/14  Yes Pearline Cables, MD   Allergies  Allergen Reactions  . Eggs Or Egg-Derived Products Swelling    Lip swelling   Depression screen [facility] 2/9 04/11/2015 10/07/2014  Decreased Interest 0 0  Down, Depressed, Hopeless 0 0  PHQ - 2 Score 0 0      Review of Systems  Constitutional: Negative for fever, chills, activity change, appetite change and fatigue.  Gastrointestinal: Positive for rectal pain. Negative for nausea, vomiting, abdominal pain, diarrhea, constipation, blood in stool and anal bleeding.  Genitourinary: Negative for dysuria, urgency, frequency, hematuria, decreased urine volume, discharge, penile swelling, scrotal swelling, enuresis, difficulty urinating, penile pain and testicular pain.       Groin pain  Psychiatric/Behavioral: Positive for sleep disturbance. Negative for dysphoric mood. The patient is nervous/anxious.       Objective:   Physical Exam  Constitutional: He is oriented to person, place, and time. He appears well-developed and well-nourished. No distress.  HENT:  Head: Normocephalic and atraumatic.  Eyes: EOM are normal. Pupils are equal, round, and reactive to light.  Neck: Neck supple. No thyromegaly present.  Cardiovascular: Normal rate, regular rhythm,  S1 normal, S2 normal and normal heart sounds.   No murmur heard. Pulmonary/Chest: Effort normal and breath sounds normal. No respiratory distress. He has no wheezes.  Abdominal: Soft. Bowel sounds are normal. He exhibits no distension. There is no tenderness.  Musculoskeletal: Normal range of motion.  Neurological: He is alert and oriented to person, place, and time.  Skin: Skin is warm and dry.  Psychiatric: He has a normal mood and affect. His behavior is normal.  Nursing note and vitals reviewed.   BP 126/72 mmHg  Pulse 88  Temp(Src) 98.6 F (37 C) (Oral)  Resp 14  Ht  5' 11.5" (1.816 m)  Wt 267 lb (121.11 kg)  BMI 36.72 kg/m2  SpO2 98%    Assessment & Plan:   1. Testicular discomfort   2. Inguinal hernia without obstruction or gangrene, recurrence not specified, unspecified laterality   3. Anxiety   4. Anxiety state - refilled klonopin, reminded pt of [facility] controlled sub policy - needs to f/u w/ PCP for refills.  Pt trying to wean back on klonopin which I encouraged, rec therapy - contact info given.    Orders Placed This Encounter  Procedures  . CT Pelvis Wo Contrast    267LBS/NO NEEDS/INS/BCBS/HB/PT W/EPIC     Standing Status: Future     Number of Occurrences:      Standing Expiration Date: 07/09/2016    Scheduling Instructions:     Likely schedule on/after 05/04/2015    Order Specific Question:  Reason for Exam (SYMPTOM  OR DIAGNOSIS REQUIRED)    Answer:  suspect inguinal hernia    Order Specific Question:  Preferred imaging location?    Answer:  [id number]  . Ambulatory referral to General Surgery    Referral Priority:  Routine    Referral Type:  Surgical    Referral Reason:  Specialty Services Required    Requested Specialty:  General Surgery    Number of Visits Requested:  1    Meds ordered this encounter  Medications  . clonazePAM (KLONOPIN) 1 MG tablet    Sig: Take 1 tablet (1 mg total) by mouth 2 (two) times daily.    Dispense:  60 tablet    Refill:  3    I personally performed the services described in this documentation, which was scribed in my presence. The recorded information has been reviewed and considered, and addended by me as needed.  Norberto SorensonEva Shaw, MD MPH    By signing my name below, I, Stann Oresung-Kai Tsai, attest that this documentation has been prepared under the direction and in the presence of Norberto SorensonEva Shaw, MD. Electronically Signed: Stann Oresung-Kai Tsai, Scribe. 04/11/2015 , 12:32 PM .

## 2015-04-11 NOTE — Patient Instructions (Signed)
[facility] Policy for Prescribing Controlled Substances (Revised 03/2012) 1. Prescriptions for controlled substances will be filled by ONE provider at [facility] with whom you have established and developed a plan for your care, including follow-up. 2. You are encouraged to schedule an appointment with your prescriber at our appointment center for follow-up visits whenever possible. 3. If you request a prescription for the controlled substance while at [facility] for an acute problem (with someone other than your regular prescriber), you MAY be given a ONE-TIME prescription for a 30-day supply of the controlled substance, to allow time for you to return to see your regular prescriber for additional prescriptions.  Highly recommend checking out some of the below placed in the new year for therapy regarding anxiety and panic attacks - lots of behavioral techniques can help significantly decrease frequency and severity of symptoms without side effects.  [facility]  Address: [address], [place], [place] [address]  [phone]  [facility] Address: [address], [place], [place] [address]  [phone]  [facility] [address] [place], [place] Phone: [phone]   [facility] [address]  [place], [place] [address] Phone: [phone]  [facility]  [address], [place], [place] [address]  [phone]  [facility] [address], [place], [place] [address]  Phone: [phone]   Charlies SilversParish A. McKinney, MD, PA [address], [address] [place], [place] [address] Phone: [phone]

## 2015-05-07 ENCOUNTER — Other Ambulatory Visit: Payer: BLUE CROSS/BLUE SHIELD

## 2015-06-25 ENCOUNTER — Ambulatory Visit: Payer: Self-pay | Admitting: General Surgery

## 2015-06-26 ENCOUNTER — Telehealth: Payer: Self-pay

## 2015-09-09 ENCOUNTER — Other Ambulatory Visit: Payer: Self-pay | Admitting: Family Medicine

## 2015-09-13 NOTE — Telephone Encounter (Signed)
Ok to refill for 2 months (60 tabs with 1 refill) but no further refills w/o OV.  Tried to call it into his pharmacy but the automated system wouldn't let me leave a message when pharmacy was closed so please try again. Thanks.

## 2015-09-16 ENCOUNTER — Telehealth: Payer: Self-pay | Admitting: Emergency Medicine

## 2015-09-16 NOTE — Telephone Encounter (Signed)
Pt instructed medication Clonazepam refilled for #60, 1 refill  Informed he will need OV before next refill Medication [facility] per order

## 2015-09-21 ENCOUNTER — Other Ambulatory Visit: Payer: Self-pay | Admitting: Family Medicine

## 2015-09-23 NOTE — Telephone Encounter (Signed)
Called in RFs w/ need for f/up as part of sig. Also asked pharm to let pt know that he will need OV before these two RFs run out.

## 2015-09-24 NOTE — Telephone Encounter (Signed)
Received refill request for Trazodone 150 mg Take 1 Tablet by mouth once daily at bedtime.  Last ov: 10/07/14 Last filled: 10/07/14  Is it ok to refill? Please advise.

## 2015-09-25 ENCOUNTER — Telehealth: Payer: Self-pay | Admitting: Emergency Medicine

## 2015-10-01 NOTE — Telephone Encounter (Signed)
Pt due for ov. Tried to contact pt to schedule ov but no answer. Left message for pt to call back to schedule appt.

## 2015-10-09 ENCOUNTER — Ambulatory Visit (INDEPENDENT_AMBULATORY_CARE_PROVIDER_SITE_OTHER): Payer: BLUE CROSS/BLUE SHIELD | Admitting: Family Medicine

## 2015-10-09 ENCOUNTER — Encounter: Payer: Self-pay | Admitting: Emergency Medicine

## 2015-10-09 VITALS — BP 127/73 | HR 87 | Temp 97.9°F | Ht 72.0 in | Wt 272.6 lb

## 2015-10-09 DIAGNOSIS — R7401 Elevation of levels of liver transaminase levels: Secondary | ICD-10-CM

## 2015-10-09 DIAGNOSIS — Z13220 Encounter for screening for lipoid disorders: Secondary | ICD-10-CM | POA: Diagnosis not present

## 2015-10-09 DIAGNOSIS — F411 Generalized anxiety disorder: Secondary | ICD-10-CM

## 2015-10-09 DIAGNOSIS — Z131 Encounter for screening for diabetes mellitus: Secondary | ICD-10-CM

## 2015-10-09 DIAGNOSIS — R740 Nonspecific elevation of levels of transaminase and lactic acid dehydrogenase [LDH]: Secondary | ICD-10-CM

## 2015-10-09 DIAGNOSIS — R7989 Other specified abnormal findings of blood chemistry: Secondary | ICD-10-CM

## 2015-10-09 DIAGNOSIS — Z130 Encounter for screening for diseases of the blood and blood-forming organs and certain disorders involving the immune mechanism: Secondary | ICD-10-CM

## 2015-10-09 DIAGNOSIS — Z23 Encounter for immunization: Secondary | ICD-10-CM | POA: Diagnosis not present

## 2015-10-09 LAB — COMPREHENSIVE METABOLIC PANEL
ALK PHOS: 57 U/L (ref 39–117)
ALT: 174 U/L — AB (ref 0–53)
AST: 85 U/L — AB (ref 0–37)
Albumin: 4.7 g/dL (ref 3.5–5.2)
BILIRUBIN TOTAL: 0.6 mg/dL (ref 0.2–1.2)
BUN: 11 mg/dL (ref 6–23)
CALCIUM: 9.4 mg/dL (ref 8.4–10.5)
CO2: 26 meq/L (ref 19–32)
CREATININE: 1.06 mg/dL (ref 0.40–1.50)
Chloride: 103 mEq/L (ref 96–112)
GFR: 88.42 mL/min (ref >60.00)
Glucose, Bld: 104 mg/dL — ABNORMAL HIGH (ref 70–99)
Potassium: 3.6 mEq/L (ref 3.5–5.1)
Sodium: 139 mEq/L (ref 135–145)
TOTAL PROTEIN: 7.2 g/dL (ref 6.0–8.3)

## 2015-10-09 LAB — CBC
HCT: 45.3 % (ref 39.0–52.0)
HEMOGLOBIN: 15.3 g/dL (ref 13.0–17.0)
MCHC: 33.8 g/dL (ref 30.0–36.0)
MCV: 88.6 fl (ref 78.0–100.0)
PLATELETS: 262 10*3/uL (ref 150.0–400.0)
RBC: 5.11 Mil/uL (ref 4.22–5.81)
RDW: 12.6 % (ref 11.5–15.5)
WBC: 8.1 10*3/uL (ref 4.0–10.5)

## 2015-10-09 LAB — LIPID PANEL
CHOLESTEROL: 165 mg/dL (ref 0–200)
HDL: 38.3 mg/dL — AB (ref >39.00)
NONHDL: 126.84
TRIGLYCERIDES: 201 mg/dL — AB (ref 0.0–149.0)
Total CHOL/HDL Ratio: 4
VLDL: 40.2 mg/dL — ABNORMAL HIGH (ref 0.0–40.0)

## 2015-10-09 LAB — HEMOGLOBIN A1C: Hgb A1c MFr Bld: 5.7 % (ref 4.6–6.5)

## 2015-10-09 LAB — LDL CHOLESTEROL, DIRECT: LDL DIRECT: 110 mg/dL

## 2015-10-09 MED ORDER — CLONAZEPAM 1 MG PO TABS: 1.0000 mg | ORAL_TABLET | Freq: Two times a day (BID) | ORAL | Status: DC

## 2015-10-09 NOTE — Progress Notes (Signed)
Pre visit review using our clinic review tool, if applicable. No additional management support is needed unless otherwise documented below in the visit note. 

## 2015-10-09 NOTE — Patient Instructions (Signed)
It was good to see you today!  You got a tetanus booster today which is good for 10 years I refilled your klonopin medication I will be in touch with your labs asap  Please get in touch with me with an update in 4-6 months

## 2015-10-09 NOTE — Progress Notes (Addendum)
[facility] [place] [address], [address] [place], [place] [address] [phone]  Date:  10/09/2015   Name:  Perry Li   DOB:  04/12/1986   MRN:  [id number]  PCP:  Abbe AmsterdamOPLAND,Dhalia Zingaro, MD    Chief Complaint: Follow-up   History of Present Illness:  Perry Li is a 30 y.o. very pleasant male patient who presents with the following:  Last seen by myself about a year ago to discuss his anxiety- he continues to deny depression, he just has some anxiety. He is sleeping pretty well- the trazodone does help him. If he does not take it he does not sleep as well.   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  He is considering going back to school this fall and would like to get an associates degree in business. His right achilles tendon is pretty much better- it does bother him a little when he is running  He did get a left inguinal hernia last summer; he has seen general surgery. He would like to get his fixed but time and finances are an issue.   He just started working out again a couple of months ago and finds that if he tries to lift very heavy his hernia hurts. He has stopped lifting such heavy weights  No recent lab work - he last ate around 5 hours ago.  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 Right Achilles tendinitis 08/15/2014  . High cholesterol   . Anxiety   . Chest pain   . SOB (shortness of breath)   . Insomnia     Past Medical History  Diagnosis Date  . High cholesterol   . Anxiety   . Chest pain   . SOB (shortness of breath)   . Insomnia     No past surgical  history on file.  Social History  Substance Use Topics  . Smoking status: Never Smoker   . Smokeless tobacco: Not on file  . Alcohol Use: 0.0 oz/week    0 Standard drinks or equivalent per week    No family history on file.  Allergies  Allergen Reactions  . Eggs Or Egg-Derived Products Swelling    Lip swelling    Medication list has been reviewed and updated.  Current Outpatient Prescriptions on File Prior to Visit  Medication Sig Dispense Refill  . clonazePAM (KLONOPIN) 1 MG tablet Take 1 tablet (1 mg total) by mouth 2 (two) times daily. NEEDS F/U OV BEFORE ANY ADDITIONAL REFILLS 60 tablet 1  . traZODone (DESYREL) 150 MG tablet TAKE 1 TABLET BY MOUTH ONCE DAILY AT BEDTIME 30 tablet 3   No current facility-administered medications on file prior to visit.    Review of Systems:  As per HPI- otherwise negative.   Physical Examination: Filed Vitals:   10/09/15 1356  BP: 127/73  Pulse: 87  Temp: 97.9 F (36.6 C)   Filed Vitals:   10/09/15 1356  Height: 6' (1.829 m)  Weight: 272 lb 9.6 oz (123.651 kg)   Body mass  index is 36.96 kg/(m^2). Ideal Body Weight: Weight in (lb) to have BMI = 25: 183.9  GEN: WDWN, NAD, Non-toxic, A & O x 3, obese, looks well HEENT: Atraumatic, Normocephalic. Neck supple. No masses, No LAD. Ears and Nose: No external deformity. CV: RRR, No M/G/R. No JVD. No thrill. No extra heart sounds. PULM: CTA B, no wheezes, crackles, rhonchi. No retractions. No resp. distress. No accessory muscle use. EXTR: No c/c/e NEURO Normal gait.  PSYCH: Normally interactive. Conversant. Not depressed or anxious appearing.  Calm demeanor.    Assessment and Plan: GAD (generalized anxiety disorder) - Plan: clonazePAM (KLONOPIN) 1 MG tablet  Screening for hyperlipidemia - Plan: Lipid panel  Screening for diabetes mellitus - Plan: Comprehensive metabolic panel, Hemoglobin A1c  Screening for deficiency anemia - Plan: CBC  Immunization due - Plan: Tdap vaccine  greater than or equal to 7yo IM  tdap and labs today as above He has been stable on his current dose of klonopin for some time, gave refills today Also refilled his trazodone for sleep last month He does not suffer from depression and still feels that his mood is overall very good Will plan further follow- up pending labs.  Signed Abbe Amsterdam, MD  Received his labs and gave him a call 6/9.  LMOM. LFTs are high.  Is he taking tylenol?  If so please stop, and cut down on alcohol if is he drinking. Will add on an acute hep panel and follow-up with him Results for orders placed or performed in visit on 10/09/15  CBC  Result Value Ref Range   WBC 8.1 4.0 - 10.5 K/uL   RBC 5.11 4.22 - 5.81 Mil/uL   Platelets 262.0 150.0 - 400.0 K/uL   Hemoglobin 15.3 13.0 - 17.0 g/dL   HCT 95.6 21.3 - 08.6 %   MCV 88.6 78.0 - 100.0 fl   MCHC 33.8 30.0 - 36.0 g/dL   RDW 57.8 46.9 - 62.9 %  Comprehensive metabolic panel  Result Value Ref Range   Sodium 139 135 - 145 mEq/L   Potassium 3.6 3.5 - 5.1 mEq/L   Chloride 103 96 - 112 mEq/L   CO2 26 19 - 32 mEq/L   Glucose, Bld 104 (H) 70 - 99 mg/dL   BUN 11 6 - 23 mg/dL   Creatinine, Ser 5.28 0.40 - 1.50 mg/dL   Total Bilirubin 0.6 0.2 - 1.2 mg/dL   Alkaline Phosphatase 57 39 - 117 U/L   AST 85 (H) 0 - 37 U/L   ALT 174 (H) 0 - 53 U/L   Total Protein 7.2 6.0 - 8.3 g/dL   Albumin 4.7 3.5 - 5.2 g/dL   Calcium 9.4 8.4 - 41.3 mg/dL   GFR 24.40 >10.27 mL/min  Lipid panel  Result Value Ref Range   Cholesterol 165 0 - 200 mg/dL   Triglycerides 253.6 (H) 0.0 - 149.0 mg/dL   HDL 64.40 (L) >34.74 mg/dL   VLDL 25.9 (H) 0.0 - 56.3 mg/dL   Total CHOL/HDL Ratio 4    NonHDL 126.84   Hemoglobin A1c  Result Value Ref Range   Hgb A1c MFr Bld 5.7 4.6 - 6.5 %  LDL cholesterol, direct  Result Value Ref Range   Direct LDL 110.0 mg/dL   Called him on 8/75 and was able to reach.  Discussed his LFTs- he had indeed been taking a lot of tylenol due to pain from the  hernia.  He will stop using this and will avoid alcohol Due  to an error the acute hep panel was not added; however he will come in this week for a repeat blood draw and we can check it then along with repeat LFTs

## 2015-10-10 ENCOUNTER — Telehealth: Payer: Self-pay | Admitting: Emergency Medicine

## 2015-10-10 ENCOUNTER — Other Ambulatory Visit: Payer: Self-pay | Admitting: Emergency Medicine

## 2015-10-10 ENCOUNTER — Other Ambulatory Visit (INDEPENDENT_AMBULATORY_CARE_PROVIDER_SITE_OTHER): Payer: BLUE CROSS/BLUE SHIELD

## 2015-10-10 DIAGNOSIS — R7989 Other specified abnormal findings of blood chemistry: Secondary | ICD-10-CM | POA: Diagnosis not present

## 2015-10-10 DIAGNOSIS — R790 Abnormal level of blood mineral: Secondary | ICD-10-CM

## 2015-10-10 LAB — HEPATIC FUNCTION PANEL
ALBUMIN: 4.7 g/dL (ref 3.5–5.2)
ALK PHOS: 56 U/L (ref 39–117)
ALT: 173 U/L — AB (ref 0–53)
AST: 90 U/L — AB (ref 0–37)
Bilirubin, Direct: 0.1 mg/dL (ref 0.0–0.3)
TOTAL PROTEIN: 7.4 g/dL (ref 6.0–8.3)
Total Bilirubin: 0.5 mg/dL (ref 0.2–1.2)

## 2015-10-10 NOTE — Progress Notes (Signed)
Lab add on request sent for acute hepatitis panel with dx of elevated LFTs.

## 2015-10-13 ENCOUNTER — Telehealth: Payer: Self-pay | Admitting: Family Medicine

## 2015-10-13 NOTE — Addendum Note (Signed)
Addended by: Abbe AmsterdamOPLAND, JESSICA C on: 10/13/2015 09:43 PM   Modules accepted: Orders

## 2015-10-13 NOTE — Telephone Encounter (Signed)
°  Relationship to patient: Self Can be reached: [phone]   Reason for call: Patient states he received a VM on Friday but accidentally erassed it. Plse call with information that was left on VM

## 2015-10-17 ENCOUNTER — Other Ambulatory Visit (INDEPENDENT_AMBULATORY_CARE_PROVIDER_SITE_OTHER): Payer: BLUE CROSS/BLUE SHIELD

## 2015-10-17 DIAGNOSIS — R740 Nonspecific elevation of levels of transaminase and lactic acid dehydrogenase [LDH]: Secondary | ICD-10-CM | POA: Diagnosis not present

## 2015-10-17 DIAGNOSIS — R7401 Elevation of levels of liver transaminase levels: Secondary | ICD-10-CM

## 2015-10-17 LAB — HEPATIC FUNCTION PANEL
ALBUMIN: 4.7 g/dL (ref 3.6–5.1)
ALK PHOS: 67 U/L (ref 40–115)
ALT: 206 U/L — ABNORMAL HIGH (ref 9–46)
AST: 97 U/L — ABNORMAL HIGH (ref 10–40)
Bilirubin, Direct: 0.1 mg/dL (ref <=0.2)
Indirect Bilirubin: 0.3 mg/dL (ref 0.2–1.2)
TOTAL PROTEIN: 7.3 g/dL (ref 6.1–8.1)
Total Bilirubin: 0.4 mg/dL (ref 0.2–1.2)

## 2015-10-18 LAB — HEPATITIS PANEL, ACUTE
HCV AB: NEGATIVE
HEP A IGM: NONREACTIVE
HEP B S AG: NEGATIVE
Hep B C IgM: NONREACTIVE

## 2015-10-24 ENCOUNTER — Other Ambulatory Visit: Payer: Self-pay | Admitting: Family Medicine

## 2015-10-24 DIAGNOSIS — R740 Nonspecific elevation of levels of transaminase and lactic acid dehydrogenase [LDH]: Principal | ICD-10-CM

## 2015-10-24 DIAGNOSIS — R7401 Elevation of levels of liver transaminase levels: Secondary | ICD-10-CM

## 2015-10-24 NOTE — Progress Notes (Signed)
Called and LMOM- [facility]  I am going to go ahead and order RUQ US for him

## 2015-11-07 NOTE — Telephone Encounter (Signed)
error 

## 2015-12-11 ENCOUNTER — Ambulatory Visit (HOSPITAL_BASED_OUTPATIENT_CLINIC_OR_DEPARTMENT_OTHER): Payer: BLUE CROSS/BLUE SHIELD

## 2016-02-05 ENCOUNTER — Ambulatory Visit: Payer: Self-pay | Admitting: General Surgery

## 2016-03-01 ENCOUNTER — Encounter: Payer: Self-pay | Admitting: Family Medicine

## 2016-03-01 ENCOUNTER — Ambulatory Visit (INDEPENDENT_AMBULATORY_CARE_PROVIDER_SITE_OTHER): Payer: BLUE CROSS/BLUE SHIELD | Admitting: Family Medicine

## 2016-03-01 VITALS — BP 122/80 | HR 80 | Temp 97.6°F | Wt 277.0 lb

## 2016-03-01 DIAGNOSIS — F411 Generalized anxiety disorder: Secondary | ICD-10-CM | POA: Diagnosis not present

## 2016-03-01 DIAGNOSIS — R945 Abnormal results of liver function studies: Principal | ICD-10-CM

## 2016-03-01 DIAGNOSIS — R7989 Other specified abnormal findings of blood chemistry: Secondary | ICD-10-CM | POA: Diagnosis not present

## 2016-03-01 LAB — HEPATIC FUNCTION PANEL
ALK PHOS: 76 U/L (ref 39–117)
ALT: 132 U/L — ABNORMAL HIGH (ref 0–53)
AST: 39 U/L — ABNORMAL HIGH (ref 0–37)
Albumin: 4.5 g/dL (ref 3.5–5.2)
BILIRUBIN DIRECT: 0.1 mg/dL (ref 0.0–0.3)
Total Bilirubin: 0.4 mg/dL (ref 0.2–1.2)
Total Protein: 7.4 g/dL (ref 6.0–8.3)

## 2016-03-01 MED ORDER — CLONAZEPAM 1 MG PO TABS: 1.0000 mg | ORAL_TABLET | Freq: Two times a day (BID) | ORAL | 5 refills | Status: DC

## 2016-03-01 NOTE — Progress Notes (Signed)
[facility] [place] [address] [place], [place] [address] [phone] [phone]  Date:  03/01/2016   Name:  Perry Li   DOB:  11/18/1985   MRN:  [id number]  PCP:  Abbe AmsterdamOPLAND,JESSICA, MD    Chief Complaint: Medication Refill (Klonopin refill.Pt states he is ok with refills on the Trazadone)   History of Present Illness:  Perry Li is a 30 y.o. very pleasant male patient who presents with the following:  Here today for a follow-up visit.  Last seen by myself in June.  He is on trazodone as needed for sleep, and has been on Klonopin BID for some time  BP Readings from Last 3 Encounters:  03/01/16 (!) 142/85  10/09/15 127/73  04/11/15 126/72   BP a little higher than normal today NCCSR- no unexpected entries, getting klonpin 1 mg every 30 days   Pt needs a contract and UDS  He is seeing general surgery for his hernia- he plans to have this repaired   He is doing fine with the klonpin.  He is stable on his current dose.   His anxiety has been "pretty good, it will go up and down. It just happens randomly."  He does better as long as he is busy.   Sleeping pretty well.  He can sometimes sleep ok without the trazodone. However if he cannot sleep the trazodone will help He really does not feel depressed.   No SI.   He states that he is "pretty happy with life,": his work and family life are going ok.   He is single, no kids.  He enjoys exercise, spending time with family  He was noted to have elevated liver function tests over the summer. He had a negative acute hep panel.  Did not have his liver US done yet due to financial concerns; we will check this for him again today He does not smoke or use any drugs  Patient Active Problem List   Diagnosis Date Noted  . Right Achilles tendinitis 08/15/2014  . High cholesterol   . Anxiety   . Chest pain   . SOB (shortness of breath)   . Insomnia     Past Medical History:   Diagnosis Date  . Anxiety   . Chest pain   . High cholesterol   . Insomnia   . SOB (shortness of breath)     No past surgical history on file.  Social History  Substance Use Topics  . Smoking status: Never Smoker  . Smokeless tobacco: Not on file  . Alcohol use 0.0 oz/week    No family history on file.  Allergies  Allergen Reactions  . Eggs Or Egg-Derived Products Swelling    Lip swelling    Medication list has been reviewed and updated.  Current Outpatient Prescriptions on File Prior to Visit  Medication Sig Dispense Refill  . clonazePAM (KLONOPIN) 1 MG tablet Take 1 tablet (1 mg total) by mouth 2 (two) times daily. 60 tablet 4  . traZODone (DESYREL) 150 MG tablet TAKE 1 TABLET BY MOUTH ONCE DAILY AT BEDTIME 30 tablet 3   No current facility-administered medications on file prior to visit.     Review of Systems: As per HPI- otherwise negative.   Physical Examination: Vitals:   03/01/16 1309  BP: (!) 142/85  Pulse: 80  Temp: 97.6 F (36.4 C)   Vitals:   03/01/16 1309  Weight: 277 lb (125.6 kg)  Body mass index is 37.57 kg/m. Ideal Body Weight:    GEN: WDWN, NAD, Non-toxic, A & O x 3, large build, looks well HEENT: Atraumatic, Normocephalic. Neck supple. No masses, No LAD. Ears and Nose: No external deformity. CV: RRR, No M/G/R. No JVD. No thrill. No extra heart sounds. PULM: CTA B, no wheezes, crackles, rhonchi. No retractions. No resp. distress. No accessory muscle use. EXTR: No c/c/e NEURO Normal gait.  PSYCH: Normally interactive. Conversant. Not depressed or anxious appearing.  Calm demeanor.    Assessment and Plan: Elevated liver function tests - Plan: Hepatic Function Panel  GAD (generalized anxiety disorder) - Plan: clonazePAM (KLONOPIN) 1 MG tablet  Refilled his klonopin, UDS today, controlled substance contract today Recheck liver function- will do US if needed when he is able Declines a flu shot today   Signed Abbe AmsterdamJessica Copland,  MD

## 2016-03-01 NOTE — Progress Notes (Signed)
Pre visit review using our clinic review tool, if applicable. No additional management support is needed unless otherwise documented below in the visit note. 

## 2016-03-01 NOTE — Patient Instructions (Addendum)
It was good to see you today- I am glad that you are doing well.  Best of luck with your hernia surgery Please see me in about 6 months for a recheck Please stop at the lab and do a urine screen today We will also recheck your liver function today

## 2016-03-10 ENCOUNTER — Other Ambulatory Visit: Payer: Self-pay | Admitting: Emergency Medicine

## 2016-03-10 ENCOUNTER — Other Ambulatory Visit: Payer: Self-pay | Admitting: Family Medicine

## 2016-03-10 MED ORDER — TRAZODONE HCL 150 MG PO TABS: 150.0000 mg | ORAL_TABLET | Freq: Every day | ORAL | 1 refills | Status: DC

## 2016-03-21 ENCOUNTER — Other Ambulatory Visit: Payer: Self-pay | Admitting: Family Medicine

## 2016-08-16 ENCOUNTER — Other Ambulatory Visit: Payer: Self-pay | Admitting: Family Medicine

## 2016-08-16 ENCOUNTER — Encounter: Payer: Self-pay | Admitting: Family Medicine

## 2016-08-16 DIAGNOSIS — F411 Generalized anxiety disorder: Secondary | ICD-10-CM

## 2016-08-16 NOTE — Telephone Encounter (Signed)
Pt requesting refill on Trazodone  and Clonazepam . Please advise.

## 2016-08-30 ENCOUNTER — Other Ambulatory Visit: Payer: Self-pay | Admitting: Family Medicine

## 2016-10-30 NOTE — Progress Notes (Signed)
[facility] [place] [address] [place], [place] [address] [phone] [phone]  Date:  11/01/2016   Name:  Perry Li   DOB:  03/20/1986   MRN:  [id number]  PCP:  Pearline Cablesopland, Aleksander Edmiston C, MD    Chief Complaint: Medication Refill (Pt here for med refill on ClonazePAM. )   History of Present Illness:  Perry ChadChristian Faires is a 31 y.o. very pleasant male patient who presents with the following:  History of anxiety Here today seeking medication refills and follow-up Last seen by myself in October:  He is doing fine with the klonpin.  He is stable on his current dose.   His anxiety has been "pretty good, it will go up and down. It just happens randomly."  He does better as long as he is busy.   Sleeping pretty well.  He can sometimes sleep ok without the trazodone. However if he cannot sleep the trazodone will help He really does not feel depressed.   No SI.   He states that he is "pretty happy with life,": his work and family life are going ok.   He is single, no kids.  He enjoys exercise, spending time with family  He was noted to have elevated liver function tests over the summer. He had a negative acute hep panel.  Did not have his liver US done yet due to financial concerns; we will check this for him again today He does not smoke or use any ilicit drugs   NCCSR: getting 60 klonpoin 1mg  every month on a regular basis.  No other entries or concerning findings Indication for chronic benzo: anxiety Medication and dose: klonopin 1mg  # pills per month: 60 Last UDS date: 10/17 Pain contract signed (Y/N): 10/17  Date narcotic database last reviewed (include red flags): 6/30  He is working outdoors right now and it is very hot outside.   He feels like his klonopin is working pretty well for him His anxiety will wax and wane.   His sleep is better- he is using trazodone as needed.  He does not take it every night at this time.   Sometimes he will find that he is "thinking too much" and this is when he needs to use the trazodone He has not noted any depression  He is working out when he can- however he does have a hernia which limits him some  Offered to check his liver function for him today- howeve he does not have insurance currently and is worried about cost    Patient Active Problem List   Diagnosis Date Noted  . Right Achilles tendinitis 08/15/2014  . High cholesterol   . Anxiety   . Chest pain   . SOB (shortness of breath)   . Insomnia     Past Medical History:  Diagnosis Date  . Anxiety   . Chest pain   . High cholesterol   . Insomnia   . SOB (shortness of breath)     No past surgical history on file.  Social History  Substance Use Topics  . Smoking status: Never Smoker  . Smokeless tobacco: Not on file  . Alcohol use 0.0 oz/week    No family history on file.  Allergies  Allergen Reactions  . Eggs Or Egg-Derived Products Swelling    Lip swelling    Medication list has been reviewed and updated.  Current Outpatient Prescriptions on File Prior to Visit  Medication Sig  Dispense Refill  . clonazePAM (KLONOPIN) 1 MG tablet TAKE 1 TABLET BY MOUTH TWICE DAILY 60 tablet 0  . traZODone (DESYREL) 150 MG tablet Take 1 tablet (150 mg total) by mouth at bedtime. 30 tablet 1  . traZODone (DESYREL) 150 MG tablet TAKE 1 TABLET BY MOUTH ONCE DAILY AT BEDTIME 30 tablet 2   No current facility-administered medications on file prior to visit.     Review of Systems:  As per HPI- otherwise negative.   Physical Examination: Vitals:   11/01/16 1244  BP: 132/82  Pulse: 89  Temp: 98.3 F (36.8 C)   Vitals:   11/01/16 1244  Weight: 279 lb 3.2 oz (126.6 kg)  Height: 6' (1.829 m)   Body mass index is 37.87 kg/m. Ideal Body Weight: Weight in (lb) to have BMI = 25: 183.9  GEN: WDWN, NAD, Non-toxic, A & O x 3, obese, otherwise looks well HEENT: Atraumatic, Normocephalic. Neck supple. No  masses, No LAD. Ears and Nose: No external deformity. CV: RRR, No M/G/R. No JVD. No thrill. No extra heart sounds. PULM: CTA B, no wheezes, crackles, rhonchi. No retractions. No resp. distress. No accessory muscle use. ABD: S, NT, ND, +BS. No rebound. No HSM. EXTR: No c/c/e NEURO Normal gait.  PSYCH: Normally interactive. Conversant. Not depressed or anxious appearing.  Calm demeanor.    Assessment and Plan: GAD (generalized anxiety disorder) - Plan: clonazePAM (KLONOPIN) 1 MG tablet  Refilled klonopin today Offered to follow-up on his liver function today- he declines due to cost concerns   Signed Abbe Amsterdam, MD

## 2016-11-01 ENCOUNTER — Ambulatory Visit (INDEPENDENT_AMBULATORY_CARE_PROVIDER_SITE_OTHER): Payer: Self-pay | Admitting: Family Medicine

## 2016-11-01 DIAGNOSIS — F411 Generalized anxiety disorder: Secondary | ICD-10-CM

## 2016-11-01 MED ORDER — CLONAZEPAM 1 MG PO TABS: 1.0000 mg | ORAL_TABLET | Freq: Two times a day (BID) | ORAL | 3 refills | Status: DC

## 2016-11-01 NOTE — Patient Instructions (Signed)
It was nice to see you again today!  Take care and please let me know when you need more medication

## 2016-11-13 IMAGING — CR DG OS CALCIS 2+V*R*
2 series · 2 of 2 positions shown · non-contrast
Comparison: None.

CLINICAL DATA: Hip pain. No history of injury. Initial evaluation.

EXAM:
RIGHT OS CALCIS - 2+ VIEW

[axial]
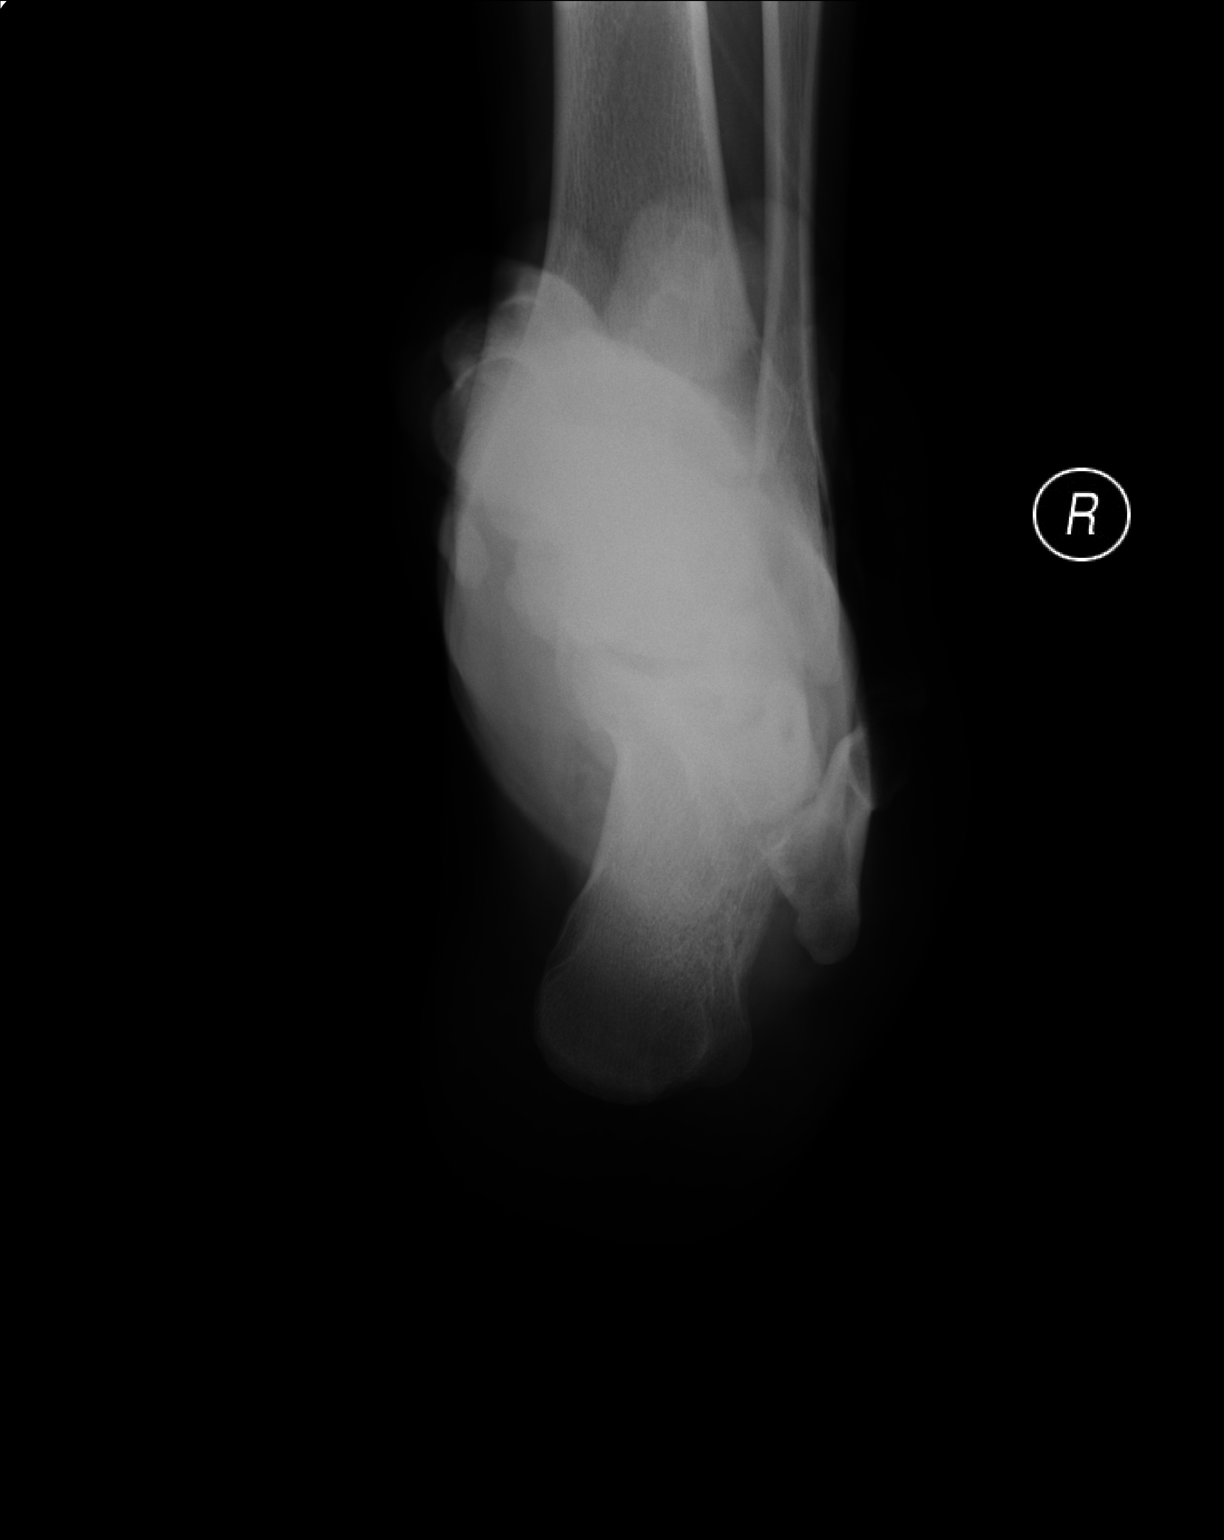

[lateral]
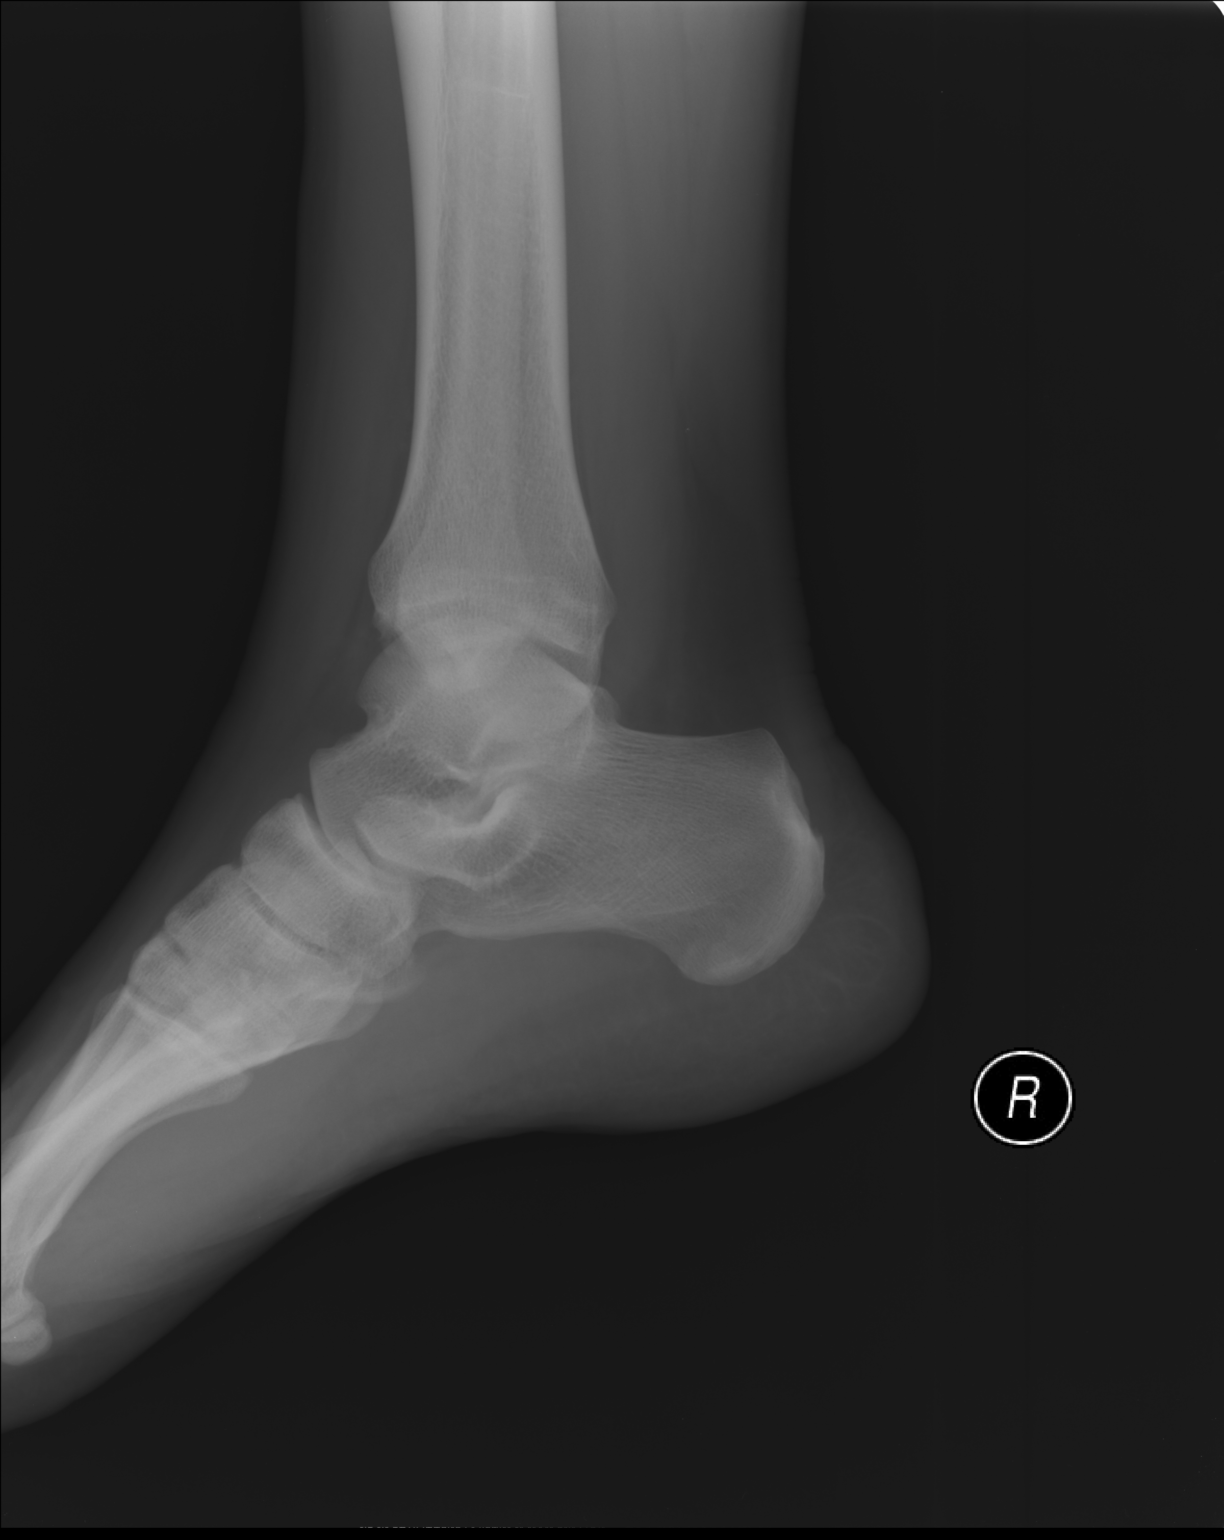

[2 of 2 positions shown; findings below may reference images not displayed]

FINDINGS: There is no evidence of fracture or other focal bone lesions. Soft
tissues are unremarkable.
IMPRESSION: Negative exam.

## 2017-02-16 ENCOUNTER — Other Ambulatory Visit: Payer: Self-pay | Admitting: Family Medicine

## 2017-02-16 DIAGNOSIS — F411 Generalized anxiety disorder: Secondary | ICD-10-CM

## 2017-02-17 ENCOUNTER — Other Ambulatory Visit: Payer: Self-pay | Admitting: Emergency Medicine

## 2017-02-17 DIAGNOSIS — F411 Generalized anxiety disorder: Secondary | ICD-10-CM

## 2017-02-17 MED ORDER — CLONAZEPAM 1 MG PO TABS: 1.0000 mg | ORAL_TABLET | Freq: Two times a day (BID) | ORAL | 3 refills | Status: DC

## 2017-02-17 NOTE — Telephone Encounter (Signed)
Requesting: clonazePAM (KLONOPIN) 1 MG tablet 03/01/16 controlled substance contract signed uds sample given, low risk next screen 08/30/16. Last OV: 11/01/16 Last Refill: 11/01/16  Please Advise

## 2017-03-18 NOTE — Telephone Encounter (Signed)
Done

## 2017-06-01 ENCOUNTER — Telehealth: Payer: Self-pay | Admitting: Family Medicine

## 2017-06-01 DIAGNOSIS — F411 Generalized anxiety disorder: Secondary | ICD-10-CM

## 2017-06-01 NOTE — Telephone Encounter (Signed)
Klonopin 1 mg  LOV: 11/01/16  Pharmacy: [facility] [address]

## 2017-06-01 NOTE — Telephone Encounter (Signed)
Copied from CRM [id number]. Topic: Quick Communication - Rx Refill/Question >> Jun 01, 2017  2:29 PM Jolayne Hainesaylor, Brittany L wrote: Medication: clonazePAM (KLONOPIN) 1 MG tablet   Has the patient contacted their pharmacy? Yes   (Agent: If no, request that the patient contact the pharmacy for the refill.)   Preferred Pharmacy (with phone number or street name): [phone] - [place], [place] - [address]   Agent: Please be advised that RX refills may take up to 3 business days. We ask that you follow-up with your pharmacy.   Patient wants to know if he can come for OV next refill bc he does not have insurance right now

## 2017-06-02 ENCOUNTER — Encounter: Payer: Self-pay | Admitting: Family Medicine

## 2017-06-02 MED ORDER — CLONAZEPAM 1 MG PO TABS: 1.0000 mg | ORAL_TABLET | Freq: Two times a day (BID) | ORAL | 3 refills | Status: DC

## 2017-06-02 NOTE — Telephone Encounter (Signed)
Pt is due for visit but does not have insurance right now per notes.  Will send him a mychart message to check in Refilled his klonopin Cannot get into NCCSR currently due to technical problems

## 2017-06-07 ENCOUNTER — Other Ambulatory Visit: Payer: Self-pay | Admitting: Family Medicine

## 2017-06-07 DIAGNOSIS — F411 Generalized anxiety disorder: Secondary | ICD-10-CM

## 2017-06-08 NOTE — Telephone Encounter (Deleted)
Received refill request on clonazePAM (KLONOPIN) 1 MG tablet. Last office visit 11/01/2016 and last refill

## 2017-06-20 ENCOUNTER — Other Ambulatory Visit: Payer: Self-pay | Admitting: Family Medicine

## 2017-06-22 NOTE — Telephone Encounter (Signed)
Refill request for traZODone (DESYREL) 150 MG tablet. Last office visit: 11/01/16 and Last refill 08/16/16.

## 2017-08-26 ENCOUNTER — Other Ambulatory Visit: Payer: Self-pay | Admitting: Family Medicine

## 2017-08-30 ENCOUNTER — Other Ambulatory Visit: Payer: Self-pay | Admitting: Family Medicine

## 2017-09-01 ENCOUNTER — Encounter: Payer: Self-pay | Admitting: Family Medicine

## 2017-09-01 NOTE — Telephone Encounter (Signed)
Received refill request for traZODone (DESYREL) 150 MG tablet . Last office visit 11/01/16 and last refill 09/01/17.

## 2017-09-01 NOTE — Telephone Encounter (Signed)
Received medication refill for traZODone (DESYREL) 150 MG tablet. Last office visit 11/01/16 and last refill 06/22/17.

## 2017-09-19 NOTE — Progress Notes (Signed)
[facility] [address] [place], [place] [address] [phone]  Date:  09/21/2017   Name:  Perry Li   DOB:  [birth year]   MRN:  [id number]  PCP:  Pearline Cables, MD    Chief Complaint: Anxiety (GAD follow up, medication refill) and Insomnia   History of Present Illness:  Perry Li is a 32 y.o. very pleasant male patient who presents with the following:  Following up today History of anxiety and insomnia I last saw him 10 months ago: He is doing fine with the klonpin. He is stable on his current dose.  His anxiety has been "pretty good, it will go up and down. It just happens randomly." He does better as long as he is busy.  Sleeping pretty well. He can sometimes sleep ok without the trazodone. However if he cannot sleep the trazodone will help He really does not feel depressed.  No SI.  He states that he is "pretty happy with life,": his work and family life are going ok.  He is single, no kids. He enjoys exercise, spending time with family  He was noted to have elevated liver function tests over the summer. He had a negative acute hep panel. Did not have his liver US done yet due to financial concerns; we will check this for him again today He does not smoke or use any ilicit drugs   NCCSR: getting 60 klonpoin  every month on a regular basis.  No other entries or concerning findings Indication for chronic benzo: anxiety Medication and dose: klonopin  # pills per month: 60 Last UDS date: 10/17 Pain contract signed (Y/N): 10/17       Date narcotic database last reviewed (include red flags): 6/30  He is working outdoors right now and it is very hot outside.   He feels like his klonopin is working pretty well for him His anxiety will wax and wane.   His sleep is better- he is using trazodone as needed.  He does not take it every night at this time.  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  He is on 150 mg He feels like his mood is overall good He does have some anxiety and does use his klonopin - generally uses BID   He is still self pay and does not want to do routine labs today- he understands that he is due for labs and to have his liver checked  NCCSR reviewed- all ok   Wt Readings from Last 3 Encounters:  09/21/17 283 lb (128.4 kg)  11/01/16 279 lb 3.2 oz (126.6 kg)  03/01/16 277 lb (125.6 kg)   2016- 252 lbs He is trying to exercise and would like to lose weight We discussed this together and went over some strategies   Patient Active Problem List   Diagnosis Date Noted  . Right Achilles tendinitis 08/15/2014  . High cholesterol   . Anxiety   . Chest pain   . SOB (shortness of breath)   . Insomnia     Past Medical History:  Diagnosis Date  . Anxiety   .  Chest pain   . High cholesterol   . Insomnia   . SOB (shortness of breath)     No past surgical history on file.  Social History   Tobacco Use  . Smoking status: Never Smoker  . Smokeless tobacco: Never Used  Substance Use Topics  . Alcohol use: Yes    Alcohol/week: 0.0 oz  . Drug use: No    No family history on file.  Allergies  Allergen Reactions  . Eggs Or Egg-Derived Products Swelling    Lip swelling    Medication list has been reviewed and updated.  No current outpatient medications on file prior to visit.   No current facility-administered medications on file prior to visit.     Review of Systems:  As per HPI- otherwise negative.   Physical Examination: Vitals:   09/21/17 1429  BP: (!) 150/88  Pulse: 79  Resp: 16  Temp: 98.2 F (36.8 C)  SpO2: 99%   Vitals:   09/21/17 1429   Weight: 283 lb (128.4 kg)  Height: 6' (1.829 m)   Body mass index is 38.38 kg/m. Ideal Body Weight: Weight in (lb) to have BMI = 25: 183.9  GEN: WDWN, NAD, Non-toxic, A & O x 3 HEENT: Atraumatic, Normocephalic. Neck supple. No masses, No LAD. Ears and Nose: No external deformity. CV: RRR, No M/G/R. No JVD. No thrill. No extra heart sounds. PULM: CTA B, no wheezes, crackles, rhonchi. No retractions. No resp. distress. No accessory muscle use. ABD: S, NT, ND, +BS. No rebound. No HSM. EXTR: No c/c/e NEURO Normal gait.  PSYCH: Normally interactive. Conversant. Not depressed or anxious appearing.  Calm demeanor.    Assessment and Plan: Primary insomnia - Plan: traZODone (DESYREL) 150 MG tablet  GAD (generalized anxiety disorder) - Plan: clonazePAM (KLONOPIN) 1 MG tablet  Refilled medications today Overweight/ borderline BP Encouraged him to work on diet and weight loss Asked him to see me as soon as he can for labs, etc  BP Readings from Last 3 Encounters:  09/21/17 (!) 150/88  11/01/16 132/82  03/01/16 122/80     Signed Abbe Amsterdam, MD

## 2017-09-21 ENCOUNTER — Ambulatory Visit (INDEPENDENT_AMBULATORY_CARE_PROVIDER_SITE_OTHER): Payer: Self-pay | Admitting: Family Medicine

## 2017-09-21 ENCOUNTER — Encounter: Payer: Self-pay | Admitting: Family Medicine

## 2017-09-21 VITALS — BP 142/80 | HR 79 | Temp 98.2°F | Resp 16 | Ht 72.0 in | Wt 283.0 lb

## 2017-09-21 DIAGNOSIS — F411 Generalized anxiety disorder: Secondary | ICD-10-CM

## 2017-09-21 DIAGNOSIS — F5101 Primary insomnia: Secondary | ICD-10-CM

## 2017-09-21 MED ORDER — TRAZODONE HCL 150 MG PO TABS: 150.0000 mg | ORAL_TABLET | Freq: Every day | ORAL | 9 refills | Status: DC

## 2017-09-21 MED ORDER — CLONAZEPAM 1 MG PO TABS: 1.0000 mg | ORAL_TABLET | Freq: Two times a day (BID) | ORAL | 3 refills | Status: DC

## 2017-09-21 NOTE — Patient Instructions (Signed)
Great to see you today!  Have a wonderful summer and I hope that you [place] it [place]  Come and see me when you can to recheck your labs  You might try something like the free "myfitnesspal" app on your phone for weight loss

## 2017-09-26 ENCOUNTER — Encounter: Payer: Self-pay | Admitting: Family Medicine

## 2017-09-27 ENCOUNTER — Telehealth: Payer: Self-pay | Admitting: Family Medicine

## 2017-09-27 ENCOUNTER — Encounter: Payer: Self-pay | Admitting: Family Medicine

## 2017-09-27 NOTE — Telephone Encounter (Signed)
Copied from CRM [id number]. Topic: Quick Communication - See Telephone Encounter >> Sep 27, 2017 11:35 AM Arlyss Gandy, NT wrote: CRM for notification. See Telephone encounter for: 09/27/17. Pt states that the pharmacy will not allow him to pick up his clonazePAM (KLONOPIN) 1 MG tablet until Thursday but that he ran out last night and wants to see if the dr can approve him to pick the medication up today? Please advice.

## 2018-01-05 ENCOUNTER — Telehealth: Payer: Self-pay | Admitting: Family Medicine

## 2018-01-05 DIAGNOSIS — F411 Generalized anxiety disorder: Secondary | ICD-10-CM

## 2018-01-05 MED ORDER — CLONAZEPAM 1 MG PO TABS: 1.0000 mg | ORAL_TABLET | Freq: Two times a day (BID) | ORAL | 3 refills | Status: DC

## 2018-01-05 NOTE — Telephone Encounter (Signed)
Copied from CRM [id number]. Topic: Quick Communication - Rx Refill/Question >> Jan 05, 2018  3:17 PM Mickel Baas B, [place] wrote: **Patient going out of town for a family emergency on Saturday 01/07/18 and will not return until a week or so later.**  Medication: clonazePAM (KLONOPIN) 1 MG tablet   Has the patient contacted their pharmacy? Yes.   (Agent: If no, request that the patient contact the pharmacy for the refill.) (Agent: If yes, when and what did the pharmacy advise?)  Preferred Pharmacy (with phone number or street name): [id number] - [place], [place] - [address]  Agent: Please be advised that RX refills may take up to 3 business days. We ask that you follow-up with your pharmacy.

## 2018-01-05 NOTE — Telephone Encounter (Signed)
Clonazepam refill Last Refill:09/21/17 # 60 Last OV: 09/21/17 PCP: Dr Shanda Bumps Copland Pharmacy: [facility] [address] [place], [place]

## 2018-01-07 ENCOUNTER — Encounter: Payer: Self-pay | Admitting: Family Medicine

## 2018-01-07 DIAGNOSIS — F411 Generalized anxiety disorder: Secondary | ICD-10-CM

## 2018-01-09 NOTE — Telephone Encounter (Signed)
Requesting:Klonopin Contract:03/01/2016 needs updated csc UDS:03/01/16 Last Visit:09/21/17 Next Visit:none Last Refill:01/05/18  Please Advise

## 2018-02-19 ENCOUNTER — Other Ambulatory Visit: Payer: Self-pay | Admitting: Family Medicine

## 2018-04-06 ENCOUNTER — Other Ambulatory Visit: Payer: Self-pay | Admitting: Family Medicine

## 2018-04-06 DIAGNOSIS — F411 Generalized anxiety disorder: Secondary | ICD-10-CM

## 2018-04-07 ENCOUNTER — Encounter: Payer: Self-pay | Admitting: Family Medicine

## 2018-04-07 ENCOUNTER — Telehealth: Payer: Self-pay

## 2018-04-07 NOTE — Telephone Encounter (Signed)
Copied from CRM [id number]. Topic: General - Inquiry >> Apr 07, 2018  2:18 PM Windy KalataMichael, Taylor L, NT wrote: Reason for CRM: patient is calling and is wanting Dr. Patsy Lageropland to give him a call in regards to which insurance plans Dr. Patsy Lageropland will be under this year with each insurance. I informed the patient he would need to contact the insurance companies that he is interested in and they would inform him of which plan Dr. Patsy Lageropland falls under. Patient states he wants to just discuss this with Dr. Patsy Lageropland. Please contact.

## 2018-04-07 NOTE — Telephone Encounter (Signed)
Requesting:klonopin Contract:no UDS:no Last OV:09/21/17 Next OV:not scheduled  Last Refill:01/05/18 #60-3rf Database:    Please advise

## 2018-04-07 NOTE — Telephone Encounter (Signed)
NCCSR:  02/10/2018  1   01/05/2018  Clonazepam 1 Mg Tablet  60.00 30 Je Cop  [id number]  [facility] ([id number])  1/3 4.00 LME Comm Ins  [place]  01/13/2018  1   01/05/2018  Clonazepam 1 Mg Tablet  60.00 30 Je Cop  [id number]  [facility] ([id number])  0/3 4.00 LME Comm Ins  [place]  12/19/2017  1   09/21/2017  Clonazepam 1 Mg Tablet  60.00 30 Je Cop  [id number]  [facility] ([id number])  3/3 4.00 LME Comm Ins  [place]  11/18/2017  1   09/21/2017  Clonazepam 1 Mg Tablet  60.00 30 Je Cop  [id number]  [facility] ([id number])  2/3 4.00 LME Comm Ins  [place]  10/24/2017  1   09/21/2017  Clonazepam 1 Mg Tablet  60.00 30 Je Cop  [id number]  [facility] ([id number])  1/3 4.00 LME Comm Ins  [place]  09/27/2017  1   09/21/2017  Clonazepam 1 Mg Tablet  60.00 30 Je Cop  [id number]  [facility] ([id number])  0/3 4.00 LME Comm Ins  [place]  08/30/2017  1   06/02/2017  Clonazepam 1 Mg Tablet  60.00 30 Je Cop  [id number]  [facility] ([id number])  3/3 4.00 LME Comm Ins  [place]  08/04/2017  1   06/02/2017  Clonazepam 1 Mg Tablet  60.00 30 Je Cop  [id number]  [facility] ([id number])  2/3 4.00 LME Comm Ins  [place]  07/04/2017  1   06/02/2017  Clonazepam 1 Mg Tablet  60.00 30 Je Cop  [id number]  [facility] ([id number])  1/3 4.00 LME

## 2018-04-27 ENCOUNTER — Other Ambulatory Visit: Payer: Self-pay | Admitting: Family Medicine

## 2018-04-27 DIAGNOSIS — F411 Generalized anxiety disorder: Secondary | ICD-10-CM

## 2018-04-27 NOTE — Telephone Encounter (Signed)
Copied from CRM [id number]. Topic: Quick Communication - Rx Refill/Question >> Apr 27, 2018  4:11 PM [name], Perry E wrote: Medication: clonazePAM (KLONOPIN) 1 MG tablet - Patient has enough of this medication to last him until 01.04.2020.  He wanted to request enough to get him to his scheduled 6 month follow up appt with Dr. Patsy Lageropland that is on 01.13.2020  Has the patient contacted their pharmacy? No.  Preferred Pharmacy (with phone number or street name): [id number] - [place], [place] - [address] [phone] (Phone) [phone] (Fax)    Agent: Please be advised that RX refills may take up to 3 business days. We ask that you follow-up with your pharmacy.

## 2018-04-27 NOTE — Telephone Encounter (Signed)
Requested medication (s) are due for refill today: yes  Requested medication (s) are on the active medication list: yes    Last refill: 04/07/18  #60 0 refills  Future visit scheduled yes  05/15/2018  Dr. Patsy Lageropland  Notes to clinic: not delegated       Patient has enough of this medication to last him until 01.04.2020.  Requesting enough to get him to his scheduled  follow up appt with Dr. Patsy Lageropland that is on 01.13.2020  Requested Prescriptions  Pending Prescriptions Disp Refills   clonazePAM (KLONOPIN) 1 MG tablet 60 tablet 0     Not Delegated - Psychiatry:  Anxiolytics/Hypnotics Failed - 04/27/2018  4:26 PM      Failed - This refill cannot be delegated      Failed - Urine Drug Screen completed in last 360 days.      Failed - Valid encounter within last 6 months    Recent Outpatient Visits          7 months ago Primary insomnia   [facility] [place] [facility], Gwenlyn FoundJessica C, MD   1 year ago GAD (generalized anxiety disorder)   [facility] [place] [facility], Gwenlyn FoundJessica C, MD   2 years ago Elevated liver function tests   [facility] [place] Copland, Gwenlyn FoundJessica C, MD   2 years ago GAD (generalized anxiety disorder)   [facility] [place] Copland, Gwenlyn FoundJessica C, MD   3 years ago Testicular discomfort   [facility] Levell JulyEva N, MD      Future Appointments            In 2 weeks Copland, Gwenlyn FoundJessica C, MD [facility] [place] [place]

## 2018-04-28 NOTE — Telephone Encounter (Signed)
Pt is due for follow up please call and schedule pt's appointment.

## 2018-05-01 NOTE — Telephone Encounter (Signed)
Pt already schedule with Dr Patsy Lageropland on 05-15-2018. Done.

## 2018-05-11 ENCOUNTER — Encounter: Payer: Self-pay | Admitting: Family Medicine

## 2018-05-11 ENCOUNTER — Inpatient Hospital Stay (HOSPITAL_BASED_OUTPATIENT_CLINIC_OR_DEPARTMENT_OTHER)
Admission: EM | Admit: 2018-05-11 | Discharge: 2018-05-14 | DRG: 639 | Disposition: A | Discharge status: Home / Self Care | Payer: BLUE CROSS/BLUE SHIELD | Attending: Internal Medicine | Admitting: Internal Medicine

## 2018-05-11 ENCOUNTER — Other Ambulatory Visit: Payer: Self-pay

## 2018-05-11 ENCOUNTER — Ambulatory Visit (INDEPENDENT_AMBULATORY_CARE_PROVIDER_SITE_OTHER): Payer: BLUE CROSS/BLUE SHIELD | Admitting: Family Medicine

## 2018-05-11 ENCOUNTER — Encounter (HOSPITAL_BASED_OUTPATIENT_CLINIC_OR_DEPARTMENT_OTHER): Payer: Self-pay

## 2018-05-11 ENCOUNTER — Encounter (HOSPITAL_COMMUNITY): Payer: Self-pay | Admitting: *Deleted

## 2018-05-11 VITALS — BP 140/98 | HR 120 | Temp 98.3°F | Resp 16 | Ht 72.0 in | Wt 254.0 lb

## 2018-05-11 DIAGNOSIS — R079 Chest pain, unspecified: Secondary | ICD-10-CM | POA: Diagnosis not present

## 2018-05-11 DIAGNOSIS — E7800 Pure hypercholesterolemia, unspecified: Secondary | ICD-10-CM | POA: Diagnosis not present

## 2018-05-11 DIAGNOSIS — Z6834 Body mass index (BMI) 34.0-34.9, adult: Secondary | ICD-10-CM

## 2018-05-11 DIAGNOSIS — E1310 Other specified diabetes mellitus with ketoacidosis without coma: Secondary | ICD-10-CM | POA: Diagnosis not present

## 2018-05-11 DIAGNOSIS — R631 Polydipsia: Secondary | ICD-10-CM

## 2018-05-11 DIAGNOSIS — E876 Hypokalemia: Secondary | ICD-10-CM | POA: Diagnosis not present

## 2018-05-11 DIAGNOSIS — E669 Obesity, unspecified: Secondary | ICD-10-CM | POA: Diagnosis present

## 2018-05-11 DIAGNOSIS — E1165 Type 2 diabetes mellitus with hyperglycemia: Secondary | ICD-10-CM | POA: Diagnosis not present

## 2018-05-11 DIAGNOSIS — Z79899 Other long term (current) drug therapy: Secondary | ICD-10-CM | POA: Diagnosis not present

## 2018-05-11 DIAGNOSIS — F419 Anxiety disorder, unspecified: Secondary | ICD-10-CM | POA: Diagnosis present

## 2018-05-11 DIAGNOSIS — H1089 Other conjunctivitis: Secondary | ICD-10-CM | POA: Diagnosis not present

## 2018-05-11 DIAGNOSIS — IMO0001 Reserved for inherently not codable concepts without codable children: Secondary | ICD-10-CM

## 2018-05-11 DIAGNOSIS — H109 Unspecified conjunctivitis: Secondary | ICD-10-CM

## 2018-05-11 DIAGNOSIS — H1031 Unspecified acute conjunctivitis, right eye: Secondary | ICD-10-CM

## 2018-05-11 DIAGNOSIS — E860 Dehydration: Secondary | ICD-10-CM | POA: Diagnosis not present

## 2018-05-11 DIAGNOSIS — R739 Hyperglycemia, unspecified: Secondary | ICD-10-CM | POA: Diagnosis present

## 2018-05-11 DIAGNOSIS — F411 Generalized anxiety disorder: Secondary | ICD-10-CM | POA: Diagnosis present

## 2018-05-11 DIAGNOSIS — E1110 Type 2 diabetes mellitus with ketoacidosis without coma: Principal | ICD-10-CM

## 2018-05-11 DIAGNOSIS — Z7984 Long term (current) use of oral hypoglycemic drugs: Secondary | ICD-10-CM

## 2018-05-11 LAB — CBC WITH DIFFERENTIAL/PLATELET
Abs Immature Granulocytes: 0.04 10*3/uL (ref 0.00–0.07)
Basophils Absolute: 0 10*3/uL (ref 0.0–0.1)
Basophils Relative: 0 %
Eosinophils Absolute: 0.1 10*3/uL (ref 0.0–0.5)
Eosinophils Relative: 1 %
HCT: 51.3 % (ref 39.0–52.0)
Hemoglobin: 17.3 g/dL — ABNORMAL HIGH (ref 13.0–17.0)
Immature Granulocytes: 0 %
LYMPHS ABS: 1.5 10*3/uL (ref 0.7–4.0)
Lymphocytes Relative: 14 %
MCH: 30.1 pg (ref 26.0–34.0)
MCHC: 33.7 g/dL (ref 30.0–36.0)
MCV: 89.4 fL (ref 80.0–100.0)
Monocytes Absolute: 0.5 10*3/uL (ref 0.1–1.0)
Monocytes Relative: 5 %
NRBC: 0 % (ref 0.0–0.2)
Neutro Abs: 8.1 10*3/uL — ABNORMAL HIGH (ref 1.7–7.7)
Neutrophils Relative %: 80 %
Platelets: 293 10*3/uL (ref 150–400)
RBC: 5.74 MIL/uL (ref 4.22–5.81)
RDW: 12.3 % (ref 11.5–15.5)
WBC: 10.2 10*3/uL (ref 4.0–10.5)

## 2018-05-11 LAB — BASIC METABOLIC PANEL
Anion gap: 16 — ABNORMAL HIGH (ref 5–15)
BUN: 10 mg/dL (ref 6–20)
CO2: 9 mmol/L — ABNORMAL LOW (ref 22–32)
Calcium: 7.9 mg/dL — ABNORMAL LOW (ref 8.9–10.3)
Chloride: 108 mmol/L (ref 98–111)
Creatinine, Ser: 0.87 mg/dL (ref 0.61–1.24)
GFR calc Af Amer: >60 mL/min (ref >60)
GFR calc non Af Amer: >60 mL/min (ref >60)
Glucose, Bld: 244 mg/dL — ABNORMAL HIGH (ref 70–99)
Potassium: 3.5 mmol/L (ref 3.5–5.1)
Sodium: 133 mmol/L — ABNORMAL LOW (ref 135–145)

## 2018-05-11 LAB — COMPREHENSIVE METABOLIC PANEL
ALT: 129 U/L — ABNORMAL HIGH (ref 0–44)
AST: 39 U/L (ref 15–41)
Albumin: 4.7 g/dL (ref 3.5–5.0)
Alkaline Phosphatase: 118 U/L (ref 38–126)
Anion gap: 21 — ABNORMAL HIGH (ref 5–15)
BUN: 14 mg/dL (ref 6–20)
CHLORIDE: 97 mmol/L — AB (ref 98–111)
CO2: 10 mmol/L — AB (ref 22–32)
Calcium: 9 mg/dL (ref 8.9–10.3)
Creatinine, Ser: 1.08 mg/dL (ref 0.61–1.24)
GFR calc Af Amer: >60 mL/min (ref >60)
GFR calc non Af Amer: >60 mL/min (ref >60)
Glucose, Bld: 422 mg/dL — ABNORMAL HIGH (ref 70–99)
Potassium: 4.3 mmol/L (ref 3.5–5.1)
SODIUM: 128 mmol/L — AB (ref 135–145)
Total Bilirubin: 1.4 mg/dL — ABNORMAL HIGH (ref 0.3–1.2)
Total Protein: 8.2 g/dL — ABNORMAL HIGH (ref 6.5–8.1)

## 2018-05-11 LAB — GLUCOSE, CAPILLARY
Glucose-Capillary: 260 mg/dL — ABNORMAL HIGH (ref 70–99)
Glucose-Capillary: 230 mg/dL — ABNORMAL HIGH (ref 70–99)
Glucose-Capillary: 227 mg/dL — ABNORMAL HIGH (ref 70–99)
Glucose-Capillary: 223 mg/dL — ABNORMAL HIGH (ref 70–99)

## 2018-05-11 LAB — POCT URINALYSIS DIP (MANUAL ENTRY)
Bilirubin, UA: NEGATIVE
Leukocytes, UA: NEGATIVE
Nitrite, UA: NEGATIVE
Protein Ur, POC: 30 mg/dL — AB
SPEC GRAV UA: 1.025 (ref 1.010–1.025)
UROBILINOGEN UA: 0.2 U/dL
pH, UA: 5 (ref 5.0–8.0)

## 2018-05-11 LAB — I-STAT VENOUS BLOOD GAS, ED
ACID-BASE DEFICIT: 15 mmol/L — AB (ref 0.0–2.0)
Bicarbonate: 11 mmol/L — ABNORMAL LOW (ref 20.0–28.0)
O2 SAT: 57 %
Patient temperature: 98.3
TCO2: 12 mmol/L — ABNORMAL LOW (ref 22–32)
pCO2, Ven: 27 mmHg — ABNORMAL LOW (ref 44.0–60.0)
pH, Ven: 7.216 — ABNORMAL LOW (ref 7.250–7.430)
pO2, Ven: 35 mmHg (ref 32.0–45.0)

## 2018-05-11 LAB — CBG MONITORING, ED
Glucose-Capillary: 450 mg/dL — ABNORMAL HIGH (ref 70–99)
Glucose-Capillary: 334 mg/dL — ABNORMAL HIGH (ref 70–99)
Glucose-Capillary: 316 mg/dL — ABNORMAL HIGH (ref 70–99)
Glucose-Capillary: 306 mg/dL — ABNORMAL HIGH (ref 70–99)

## 2018-05-11 LAB — MRSA PCR SCREENING: MRSA by PCR: NEGATIVE

## 2018-05-11 LAB — GLUCOSE, POCT (MANUAL RESULT ENTRY): POC Glucose: 429 mg/dl — AB (ref 70–99)

## 2018-05-11 MED ORDER — INSULIN REGULAR(HUMAN) IN NACL 100-0.9 UT/100ML-% IV SOLN
INTRAVENOUS | Status: DC
  Administered 2018-05-11: 2.7 [IU]/h via INTRAVENOUS

## 2018-05-11 MED ORDER — INSULIN REGULAR(HUMAN) IN NACL 100-0.9 UT/100ML-% IV SOLN
INTRAVENOUS | Status: AC
  Administered 2018-05-11: 2.7 [IU]/h via INTRAVENOUS
  Filled 2018-05-11: qty 100

## 2018-05-11 MED ORDER — CLONAZEPAM 0.5 MG PO TBDP
1.0000 mg | ORAL_TABLET | Freq: Once | ORAL | Status: AC
  Administered 2018-05-11: 1 mg via ORAL
  Filled 2018-05-11: qty 2

## 2018-05-11 MED ORDER — TRAZODONE HCL 50 MG PO TABS
150.0000 mg | ORAL_TABLET | Freq: Once | ORAL | Status: AC
  Administered 2018-05-12: 150 mg via ORAL
  Filled 2018-05-11: qty 3

## 2018-05-11 MED ORDER — SODIUM CHLORIDE 0.9 % IV SOLN: INTRAVENOUS | Status: DC

## 2018-05-11 MED ORDER — POLYMYXIN B-TRIMETHOPRIM 10000-0.1 UNIT/ML-% OP SOLN: 1.0000 [drp] | OPHTHALMIC | 0 refills | Status: DC

## 2018-05-11 MED ORDER — LORAZEPAM 2 MG/ML IJ SOLN: 0.5000 mg | INTRAMUSCULAR | Status: DC | PRN

## 2018-05-11 MED ORDER — DEXTROSE-NACL 5-0.45 % IV SOLN: INTRAVENOUS | Status: DC

## 2018-05-11 MED ORDER — POTASSIUM CHLORIDE 10 MEQ/100ML IV SOLN
10.0000 meq | INTRAVENOUS | Status: AC
  Administered 2018-05-11 (×2): 10 meq via INTRAVENOUS
  Filled 2018-05-11 (×2): qty 100

## 2018-05-11 MED ORDER — METFORMIN HCL 500 MG PO TABS: 500.0000 mg | ORAL_TABLET | Freq: Two times a day (BID) | ORAL | 3 refills | Status: DC

## 2018-05-11 MED ORDER — ENOXAPARIN SODIUM 40 MG/0.4ML ~~LOC~~ SOLN: 40.0000 mg | SUBCUTANEOUS | Status: DC

## 2018-05-11 MED ORDER — POLYMYXIN B-TRIMETHOPRIM 10000-0.1 UNIT/ML-% OP SOLN
1.0000 [drp] | OPHTHALMIC | Status: DC
  Administered 2018-05-11 – 2018-05-12 (×3): 1 [drp] via OPHTHALMIC
  Filled 2018-05-11: qty 10

## 2018-05-11 MED ORDER — DEXTROSE-NACL 5-0.45 % IV SOLN
INTRAVENOUS | Status: DC
  Administered 2018-05-11: 21:00:00 via INTRAVENOUS

## 2018-05-11 MED ORDER — LIP MEDEX EX OINT
TOPICAL_OINTMENT | CUTANEOUS | Status: AC
  Administered 2018-05-11: 21:00:00
  Filled 2018-05-11: qty 7

## 2018-05-11 MED ORDER — SODIUM CHLORIDE 0.9 % IV BOLUS
1000.0000 mL | Freq: Once | INTRAVENOUS | Status: AC
  Administered 2018-05-11: 1000 mL via INTRAVENOUS

## 2018-05-11 MED ORDER — SODIUM CHLORIDE 0.9 % IV SOLN
INTRAVENOUS | Status: DC
  Administered 2018-05-11: 17:00:00 via INTRAVENOUS

## 2018-05-11 MED ORDER — BLOOD GLUCOSE METER KIT: PACK | 0 refills | Status: AC

## 2018-05-11 MED ORDER — ENOXAPARIN SODIUM 40 MG/0.4ML ~~LOC~~ SOLN
40.0000 mg | SUBCUTANEOUS | Status: DC
  Administered 2018-05-12 – 2018-05-13 (×2): 40 mg via SUBCUTANEOUS
  Filled 2018-05-11 (×3): qty 0.4

## 2018-05-11 NOTE — ED Notes (Signed)
[facility] departed with pt for transfer 

## 2018-05-11 NOTE — ED Triage Notes (Signed)
Pt sent from upstairs due to new onset of diabetes. Pt reports dry mouth, increased thirst and urination. CBG- 450 during assessment.

## 2018-05-11 NOTE — Progress Notes (Signed)
Perry Li is sent to [facility] emergency room by primary care doctor due to concern of new sudden onset of diabetes. ED course: He has sinus tachycardia heart rate in the 120s, blood pressure stable.  Basic lab work showed blood glucose of 400 anion gap 28 bicarb 10, elevated ALT and total bilirubin.  He is given 2 L of IV fluids bolus started on insulin drip.  EDP request admit for DKA and new set of new onset of diabetes.  Except to stepdown observation status at [facility] long Please call tried hospitalist admission upon patient's arrival.

## 2018-05-11 NOTE — ED Provider Notes (Signed)
[facility] EMERGENCY DEPARTMENT Provider Note   CSN: [id number] Arrival date & time: 05/11/18  1450     History   Chief Complaint Chief Complaint  Patient presents with  . Hyperglycemia    HPI Perry Li is a 33 y.o. male.  HPI Patient sent from PCP for hyperglycemia.  Over the last couple months he is felt bad.  States he has been more thirsty.  Has not been sleeping well because had to urinate a lot at night.  Has had weight loss of around 30 pounds.  Has had decreased appetite over last few days.  Seen by PCP and had sugar 450.  Not a known diabetic. Past Medical History:  Diagnosis Date  . Anxiety   . Chest pain   . High cholesterol   . Insomnia   . SOB (shortness of breath)     Patient Active Problem List   Diagnosis Date Noted  . Right Achilles tendinitis 08/15/2014  . High cholesterol   . Anxiety   . Chest pain   . SOB (shortness of breath)   . Insomnia     History reviewed. No pertinent surgical history.      Home Medications    Prior to Admission medications   Medication Sig Start Date End Date Taking? Authorizing Provider  blood glucose meter kit and supplies Dispense based on patient and insurance preference. Use up to twice daily as directed. (FOR ICD-10 E10.9, E11.9). 05/11/18   Copland, Gay Filler, MD  clonazePAM (KLONOPIN) 1 MG tablet Take 1 tablet (1 mg total) by mouth 2 (two) times daily. 01/05/18   Copland, Gay Filler, MD  metFORMIN (GLUCOPHAGE) 500 MG tablet Take 1 tablet (500 mg total) by mouth 2 (two) times daily with a meal. 05/11/18   Copland, Gay Filler, MD  traZODone (DESYREL) 150 MG tablet Take 1 tablet (150 mg total) by mouth at bedtime. 09/21/17   Copland, Gay Filler, MD  trimethoprim-polymyxin b (POLYTRIM) ophthalmic solution Place 1 drop into the right eye every 4 (four) hours. Use for 7 days for pinkeye 05/11/18   Copland, Gay Filler, MD    Family History No family history on file.  Social History Social History    Tobacco Use  . Smoking status: Never Smoker  . Smokeless tobacco: Never Used  Substance Use Topics  . Alcohol use: Yes    Alcohol/week: 0.0 standard drinks  . Drug use: No     Allergies   Eggs or egg-derived products   Review of Systems Review of Systems  Constitutional: Positive for appetite change and unexpected weight change.  HENT: Negative for congestion and trouble swallowing.   Eyes: Positive for discharge.  Respiratory: Positive for shortness of breath.   Gastrointestinal: Negative for abdominal pain.  Endocrine: Positive for polyuria. Negative for polyphagia.  Musculoskeletal: Negative for arthralgias.  Skin: Negative for rash.  Neurological: Negative for speech difficulty and numbness.  Psychiatric/Behavioral: Negative for confusion.     Physical Exam Updated Vital Signs BP (!) 145/93 (BP Location: Left Arm)   Pulse (!) 122   Temp 98.3 F (36.8 C) (Oral)   Resp 18   Ht 6' (1.829 m)   Wt 115.7 kg Comment: weighted today at doctors office  SpO2 99%   BMI 34.58 kg/m   Physical Exam HENT:     Head: Normocephalic.     Mouth/Throat:     Mouth: Mucous membranes are dry.  Eyes:     Comments: Conjunctival injection on right.  Cardiovascular:  Comments: Tachycardia Pulmonary:     Effort: Pulmonary effort is normal.  Abdominal:     Tenderness: There is no abdominal tenderness.  Musculoskeletal:     Right lower leg: No edema.     Left lower leg: No edema.  Skin:    General: Skin is warm.     Capillary Refill: Capillary refill takes less than 2 seconds.     Coloration: Skin is not jaundiced.  Neurological:     General: No focal deficit present.     Mental Status: He is alert.      ED Treatments / Results  Labs (all labs ordered are listed, but only abnormal results are displayed) Labs Reviewed  COMPREHENSIVE METABOLIC PANEL - Abnormal; Notable for the following components:      Result Value   Sodium 128 (*)    Chloride 97 (*)    CO2 10  (*)    Glucose, Bld 422 (*)    Total Protein 8.2 (*)    ALT 129 (*)    Total Bilirubin 1.4 (*)    Anion gap 21 (*)    All other components within normal limits  CBC WITH DIFFERENTIAL/PLATELET - Abnormal; Notable for the following components:   Hemoglobin 17.3 (*)    Neutro Abs 8.1 (*)    All other components within normal limits  CBG MONITORING, ED - Abnormal; Notable for the following components:   Glucose-Capillary 450 (*)    All other components within normal limits  I-STAT VENOUS BLOOD GAS, ED - Abnormal; Notable for the following components:   pH, Ven 7.216 (*)    pCO2, Ven 27.0 (*)    Bicarbonate 11.0 (*)    TCO2 12 (*)    Acid-base deficit 15.0 (*)    All other components within normal limits  BLOOD GAS, VENOUS    EKG EKG Interpretation  Date/Time:  Thursday May 11 2018 15:57:34 EST Ventricular Rate:  110 PR Interval:    QRS Duration: 86 QT Interval:  349 QTC Calculation: 473 R Axis:   72 Text Interpretation:  Sinus tachycardia Borderline prolonged QT interval Confirmed by Davonna Belling [phone]) on 05/11/2018 4:12:47 PM   Radiology No results found.  Procedures Procedures (including critical care time)  Medications Ordered in ED Medications  dextrose 5 %-0.45 % sodium chloride infusion (has no administration in time range)  insulin regular, human (MYXREDLIN) 100 units/ 100 mL infusion (has no administration in time range)  0.9 %  sodium chloride infusion (has no administration in time range)  sodium chloride 0.9 % bolus 1,000 mL (1,000 mLs Intravenous New Bag/Given 05/11/18 1547)  sodium chloride 0.9 % bolus 1,000 mL (1,000 mLs Intravenous New Bag/Given 05/11/18 1545)     Initial Impression / Assessment and Plan / ED Course  I have reviewed the triage vital signs and the nursing notes.  Pertinent labs & imaging results that were available during my care of the patient were reviewed by me and considered in my medical decision making (see chart for  details).     Patient has new onset diabetes with sugar of around 450.  However bicarb is 10 or 11 pH 7.2.  Gap 23.  With this DKA I feel patient benefit of admission to hospitalist.  Started on insulin drip.  [facility].  Patient also has pinkeye on the right side and was going to treat as an outpatient.  CRITICAL CARE Performed by: Davonna Belling Total critical care time: 30 minutes Critical care  time was exclusive of separately billable procedures and treating other patients. Critical care was necessary to treat or prevent imminent or life-threatening deterioration. Critical care was time spent personally by me on the following activities: development of treatment plan with patient and/or surrogate as well as nursing, discussions with consultants, evaluation of patient's response to treatment, examination of patient, obtaining history from patient or surrogate, ordering and performing treatments and interventions, ordering and review of laboratory studies, ordering and review of radiographic studies, pulse oximetry and re-evaluation of patient's condition.   Final Clinical Impressions(s) / ED Diagnoses   Final diagnoses:  Diabetic ketoacidosis without coma associated with type 2 diabetes mellitus ([place])  Conjunctivitis of right eye, unspecified conjunctivitis type    ED Discharge Orders    None       Davonna Belling, MD 05/11/18 1651

## 2018-05-11 NOTE — H&P (Signed)
History and Physical    PLEASE NOTE THAT DRAGON DICTATION SOFTWARE WAS USED IN THE CONSTRUCTION OF THIS NOTE.   Perry Li OEV:[id number] DOB: 24-Oct-1985 DOA: 05/11/2018  PCP: Darreld Mclean, MD Patient coming from: home  I have personally briefly reviewed patient's old medical records in [place]  Chief Complaint: polydipsia  HPI: Perry Li is a 33 y.o. male with medical history significant for anxiety, who is admitted to [facility] long [facility] on 05/11/2018 by way of transfer from [place] emergency department for further evaluation and management of DKA in the setting of new diagnosis of diabetes after presenting to the latter facility for further evaluation of hyperglycemia.   In the absence of any prior known history of diabetes, the patient presented to his PCP earlier today for evaluation 1 to 2 months of polydipsia, polyuria, and unintentional weight loss of approximately 30 pounds over that time.  He also notes generalized fatigue over that same timeframe.  PCP performed point-of-care glucose, which was noted to be greater than 400, prompting PCP to instruct the patient to present to the emergency department for further evaluation and management.   The patient reports mild intermittent nausea over the last 2 to 3 days in the absence of any associated vomiting.  Denies any abdominal discomfort or change in bowel habits.  Specifically the patient denies any associated loose stool, melena, or hematochezia.  Denies subjective fever, chills, rigors, or rash.  Denies any chest pain, palpitations, diaphoresis, dizziness, presyncope, or syncope.  The patient confirms that he has never previously been on insulin.  He also denies any personal history of hypothyroidism, vitiligo, or pernicious anemia.  He reports multiple maternal relatives with diabetes, although he is unsure if this is of the type I or type II nature.   [facility] ED Course: Vital  signs in the ED were notable for the following: Temperature 98.3; initial heart rate 122, which improved to 97 following interval IV fluid administration as prescribed below; blood pressure 145/93, respiratory rate 18-19, and oxygen saturation 99 to 100% on room air.  Labs performed in the ED were notable for the following: CMP notable for corrected sodium of 133, potassium 4.3, bicarbonate 10, anion gap 21, and glucose 422.  CBC notable for white blood cell count of 10,200 with 80% neutrophils.  VBG showed 7.216/27.   While still in the ED, the patient received a 2 L IV normal saline bolus followed by IV normal saline running at 120s.  Additionally, insulin drip was initiated.  Subsequently, the patient was transferred to [facility] for further evaluation and management of presenting DKA in the setting of new diagnosis of diabetes.   Review of Systems: As per HPI otherwise 10 point review of systems negative.   Past Medical History:  Diagnosis Date  . Anxiety   . Chest pain   . High cholesterol   . Insomnia   . SOB (shortness of breath)     Past Surgical History:  Procedure Laterality Date  . NO PAST SURGERIES      Social History:  reports that he has never smoked. He has never used smokeless tobacco. He reports current alcohol use. He reports that he does not use drugs.   Allergies  Allergen Reactions  . Eggs Or Egg-Derived Products Swelling    Lip swelling    Family History:  Family history reviewed and not pertinent.    Prior to Admission medications   Medication Sig Start  Date End Date Taking? Authorizing Provider  blood glucose meter kit and supplies Dispense based on patient and insurance preference. Use up to twice daily as directed. (FOR ICD-10 E10.9, E11.9). 05/11/18   Copland, Gay Filler, MD  clonazePAM (KLONOPIN) 1 MG tablet Take 1 tablet (1 mg total) by mouth 2 (two) times daily. 01/05/18   Copland, Gay Filler, MD  metFORMIN (GLUCOPHAGE) 500 MG tablet Take 1  tablet (500 mg total) by mouth 2 (two) times daily with a meal. 05/11/18   Copland, Gay Filler, MD  traZODone (DESYREL) 150 MG tablet Take 1 tablet (150 mg total) by mouth at bedtime. 09/21/17   Copland, Gay Filler, MD  trimethoprim-polymyxin b (POLYTRIM) ophthalmic solution Place 1 drop into the right eye every 4 (four) hours. Use for 7 days for pinkeye 05/11/18   Copland, Gay Filler, MD     Objective     Physical Exam: Vitals:   05/11/18 1458 05/11/18 1759  BP: (!) 145/93 139/85  Pulse: (!) 122 (!) 101  Resp: 18 19  Temp: 98.3 F (36.8 C) 98 F (36.7 C)  TempSrc: Oral Oral  SpO2: 99% 100%  Weight: 115.7 kg   Height: 6' (1.829 m)     General: appears to be stated age; alert, oriented Skin: warm, dry, no rash Head:  AT/[place] Mouth:  Oral mucosa membranes appear dry, normal dentition Neck: supple; trachea midline Heart:  RRR; did not appreciate any M/R/G Lungs: CTAB, did not appreciate any wheezes, rales, or rhonchi Abdomen: + BS; soft, ND, NT Extremities: no peripheral edema, no muscle wasting   Labs on Admission: I have personally reviewed following labs and imaging studies  CBC: Recent Labs  Lab 05/11/18 1541  WBC 10.2  NEUTROABS 8.1*  HGB 17.3*  HCT 51.3  MCV 89.4  PLT 458   Basic Metabolic Panel: Recent Labs  Lab 05/11/18 1541  NA 128*  K 4.3  CL 97*  CO2 10*  GLUCOSE 422*  BUN 14  CREATININE 1.08  CALCIUM 9.0   GFR: Estimated Creatinine Clearance: 131.3 mL/min (by C-G formula based on SCr of 1.08 mg/dL). Liver Function Tests: Recent Labs  Lab 05/11/18 1541  AST 39  ALT 129*  ALKPHOS 118  BILITOT 1.4*  PROT 8.2*  ALBUMIN 4.7   No results for input(s): LIPASE, AMYLASE in the last 168 hours. No results for input(s): AMMONIA in the last 168 hours. Coagulation Profile: No results for input(s): INR, PROTIME in the last 168 hours. Cardiac Enzymes: No results for input(s): CKTOTAL, CKMB, CKMBINDEX, TROPONINI in the last 168 hours. BNP (last 3  results) No results for input(s): PROBNP in the last 8760 hours. HbA1C: No results for input(s): HGBA1C in the last 72 hours. CBG: Recent Labs  Lab 05/11/18 1455 05/11/18 1655 05/11/18 1747 05/11/18 1850  GLUCAP 450* 334* 316* 306*   Lipid Profile: No results for input(s): CHOL, HDL, LDLCALC, TRIG, CHOLHDL, LDLDIRECT in the last 72 hours. Thyroid Function Tests: No results for input(s): TSH, T4TOTAL, FREET4, T3FREE, THYROIDAB in the last 72 hours. Anemia Panel: No results for input(s): VITAMINB12, FOLATE, FERRITIN, TIBC, IRON, RETICCTPCT in the last 72 hours. Urine analysis:    Component Value Date/Time   BILIRUBINUR negative 05/11/2018 1418   KETONESUR large (80) (A) 05/11/2018 1418   PROTEINUR =30 (A) 05/11/2018 1418   UROBILINOGEN 0.2 05/11/2018 1418   NITRITE Negative 05/11/2018 1418   LEUKOCYTESUR Negative 05/11/2018 1418    Radiological Exams on Admission: No results found.    Assessment/Plan  Deren Finkler is a 33 y.o. male with medical history significant for anxiety, who is admitted to [facility] long hospital on 05/11/2018 by way of transfer from [place] emergency department for further evaluation and management of DKA in the setting of new diagnosis of diabetes after presenting to the latter facility for further evaluation of hyperglycemia.     Principal Problem:   DKA (diabetic ketoacidoses) (HCC) Active Problems:   Anxiety   Dehydration   Hyperglycemia    #) Diabetic ketoacidosis: In the setting of newly diagnosed diabetes for which the patient was sent by his PCP to the emergency department for further evaluation of hyperglycemia, presenting labs notable for presenting blood sugar 422, VBG reflecting metabolic acidosis, anion gap 21, corrected sodium 133, potassium 4.3.  Of note, neither serum nor urine ketones were checked in the ED. while still at [facility] emergency department, the patient received a 2 L bolus of IV normal  saline and was started on insulin drip.  Per repeat BMP performed upon arrival at [facility], anion gap improving in the interval, but has not yet closed.  As the patient has no prior diagnosis of diabetes leading up today, he will require assistance from the diabetic educator, and I have placed a inpatient consult for such.  No other obvious inciting factors leading to DKA presentation other than the presence of undiagnosed diabetes, with presence of corresponding symptoms over the last 1 to 2 months.   Plan: As updated blood sugars found to be less than 250, will transition IV fluids to D5 half-normal saline, and also initiate IV potassium supplementation given finding of serum potassium level less than 5.  Every 4 hours BMPs, with attention to subsequent anion gap as well as ensuing potassium level.  Continue insulin drip per protocol, with every hour Accu-Cheks.  Will keep n.p.o. except for ice chips for now.  Once anion gap closes and patient tolerating diet, will start subcutaneous insulin.  Anticipate starting the patient on Levemir 10 units subcu twice daily.  We will continue insulin drip for 2 hours beyond the initiation of subcutaneous insulin.  I have placed a consult for the diabetic educator to see the patient in the morning.  Check hemoglobin A1c.  Check anti-beta islet cell antibodies.    #) Generalized anxiety disorder: On scheduled clonazepam as an outpatient, in the absence of any scheduled SSRI or SNRI.  Patient may ultimately benefit from initiation of a scheduled SSRI, SNRI, or BuSpar.  Plan: Given current n.p.o. status, will hold home clonazepam for now.  I have ordered PRN IV Ativan for anxiety.     DVT prophylaxis: Lovenox 40 mg [place] Qday Code Status: full  Family Communication: (none) Disposition Plan:  Per Rounding Team Consults called: (none)  Admission status: observation; stepdown unit.    PLEASE NOTE THAT DRAGON DICTATION SOFTWARE WAS USED IN THE  CONSTRUCTION OF THIS NOTE.   [place] [facility] Pager [phone] From 6 PM - 2 AM.  Otherwise, please contact night-coverage  [email or web address] Password [facility]  05/11/2018, 8:31 PM

## 2018-05-11 NOTE — ED Notes (Signed)
ED Provider at bedside. 

## 2018-05-11 NOTE — Progress Notes (Signed)
Pt admitted to 1238.  Admissions paged at 1950.  Pt cbg. 270.  Alert, oriented.  Coat, phone wallet with patient at bedside.  Pt given ice chips.  Awaiting orders.

## 2018-05-11 NOTE — Progress Notes (Addendum)
[place] at [facility] [address], [place], [place] [address] [phone]  Date:  05/11/2018   Name:  Perry Li   DOB:  07/18/1985   MRN:  [id number]  PCP:  Darreld Mclean, MD    Chief Complaint: Insomnia (6 month follow up); Increase Thirst (drinking more fluids, weight loss, family hx diabetes); and Conjunctivitis (redness, swelling, started yesterday)   History of Present Illness:  Perry Li is a 33 y.o. very pleasant male patient who presents with the following:  Here today with concern that her might have DM He noted increased hunger and thirst over the last couple of months.  Increase thirst esp the last month or so He notes that he has been really hungry about 2 months ago and was craving junk food. However, over the last month or so he has noticed significant weight loss. Wt Readings from Last 3 Encounters:  05/11/18 254 lb (115.2 kg)  09/21/17 283 lb (128.4 kg)  11/01/16 279 lb 3.2 oz (126.6 kg)   He has noted polyuria There is a family history of diabetes, but he has never been diagnosed with high blood sugar Today Fabian feels pretty bad, he rates his illness as "8 out of 10" On discussion he would like to visit the emergency department for hydration  Also, he started to notice chest pains about 2.5 months ago- like a sharp pain.  Might occur when he ate 'junk or sugar, or greasy foods" No pain with exercise  He has noted some SOB as well- "it may be a tad bit harder to breathe after a meal" He notes that he has been seen with chest pain in the past, was found to have musculoskeletal pain. He has no history of heart disease  He also has right sided conjunctivitis, and the eye was crusted this am He did note some blurry vision 6 weeks ago, now ok.  His vision is currently at baseline He does not wear contacts but does wear glasses  Patient Active Problem List   Diagnosis Date Noted  . Right  Achilles tendinitis 08/15/2014  . High cholesterol   . Anxiety   . Chest pain   . SOB (shortness of breath)   . Insomnia     Past Medical History:  Diagnosis Date  . Anxiety   . Chest pain   . High cholesterol   . Insomnia   . SOB (shortness of breath)     No past surgical history on file.  Social History   Tobacco Use  . Smoking status: Never Smoker  . Smokeless tobacco: Never Used  Substance Use Topics  . Alcohol use: Yes    Alcohol/week: 0.0 standard drinks  . Drug use: No    No family history on file.  Allergies  Allergen Reactions  . Eggs Or Egg-Derived Products Swelling    Lip swelling    Medication list has been reviewed and updated.  Current Outpatient Medications on File Prior to Visit  Medication Sig Dispense Refill  . clonazePAM (KLONOPIN) 1 MG tablet Take 1 tablet (1 mg total) by mouth 2 (two) times daily. 60 tablet 3  . traZODone (DESYREL) 150 MG tablet Take 1 tablet (150 mg total) by mouth at bedtime. 30 tablet 9   No current facility-administered medications on file prior to visit.     Review of Systems:  As per HPI- otherwise negative. No fever or chills He has felt fatigued  and out of sorts  Physical Examination: Vitals:   05/11/18 1344  BP: (!) 140/98  Pulse: (!) 120  Resp: 16  Temp: 98.3 F (36.8 C)  SpO2: 98%   Vitals:   05/11/18 1344  Weight: 254 lb (115.2 kg)  Height: 6' (1.829 m)   Body mass index is 34.45 kg/m. Ideal Body Weight: Weight in (lb) to have BMI = 25: 183.9  GEN: WDWN, NAD, Non-toxic, A & O x 3, obese HEENT: Atraumatic, Normocephalic. Neck supple. No masses, No LAD.  Bilateral TM wnl, oropharynx normal.  PEERL,EOMI. right-sided conjunctivitis is present.  No pain with ocular motionh Ears and Nose: No external deformity. CV: RRR, No M/G/R. No JVD. No thrill. No extra heart sounds. PULM: CTA B, no wheezes, crackles, rhonchi. No retractions. No resp. distress. No accessory muscle use. ABD: S, NT, ND, +BS. No  rebound. No HSM. EXTR: No c/c/e NEURO Normal gait.  PSYCH: Normally interactive. Conversant. Not depressed or anxious appearing.  Calm demeanor.  Pt clearly has ketones on his breath  Results for orders placed or performed in visit on 05/11/18  POCT Glucose (CBG)  Result Value Ref Range   POC Glucose 429 (A) 70 - 99 mg/dl  POCT urinalysis dipstick  Result Value Ref Range   Color, UA yellow yellow   Clarity, UA clear clear   Glucose, UA >=1,000 (A) negative mg/dL   Bilirubin, UA negative negative   Ketones, POC UA large (80) (A) negative mg/dL   Spec Grav, UA 1.025 1.010 - 1.025   Blood, UA trace-intact (A) negative   pH, UA 5.0 5.0 - 8.0   Protein Ur, POC =30 (A) negative mg/dL   Urobilinogen, UA 0.2 0.2 or 1.0 E.U./dL   Nitrite, UA Negative Negative   Leukocytes, UA Negative Negative    EKG: sinus tach, otherwise ok    Assessment and Plan: Uncontrolled diabetes mellitus type 2 without complications (HCC) - Plan: metFORMIN (GLUCOPHAGE) 500 MG tablet, blood glucose meter kit and supplies  Increased thirst - Plan: POCT Glucose (CBG), POCT urinalysis dipstick  Chest pain, unspecified type - Plan: EKG 12-Lead  Acute bacterial conjunctivitis of right eye - Plan: trimethoprim-polymyxin b (POLYTRIM) ophthalmic solution  Patient is here today with symptoms consistent with diabetes.  His glucose is over 400, and he has large ketones in his urine.  He is feeling fairly bad, and would appreciate getting hydration.  He will proceed the emergency department, for further evaluation and treatment.  Anticipate that he will be able to be discharged home, and I have given him a prescription for metformin.  He is asked to take this twice a day, but if he has excessive side effects he may decrease to once a day. I have also given him prescription for a glucose meter and testing supplies.  He also has conjunctivitis, I provided drops of this today. Chest pain: Unlikely to be cardiac.  EKG  today is normal except for tachycardia.  Suspect emergency department we will also do a troponin  I have called the EDP and given a brief report about this patient, appreciate their further care He will see me next week to continue his diabetes evaluation.  I have advised him to drink plenty of fluids, but avoid sugars and sweets, and limit carbs    Signed Lamar Blinks, MD

## 2018-05-11 NOTE — Patient Instructions (Addendum)
You appear to have newly diagnosed diabetes.  This is why you felt so thirsty, and have been losing weight. I sent in a prescription for metformin for you, this is an oral medication help control your blood sugar.  You can start taking 1 pill twice a day.  Drink plenty of fluids, and limit your intake of carbs and sweets.  I have also prescribed a blood sugar meter for you, please pick 1 up at your convenience.  For your pinkeye, I prescribed a an eyedrop for you to use.  Please let us know if your eye is not doing better within the next few days, sooner if worse  Please proceed to the emergency department on the ground floor, they will give you IV hydration.  This should help you feel much better!  They will also do some further lab work for you.  Please schedule a visit to see me next week, so we can continue our discussion about your diabetes, and make a plan for you

## 2018-05-12 DIAGNOSIS — F419 Anxiety disorder, unspecified: Secondary | ICD-10-CM

## 2018-05-12 DIAGNOSIS — Z6834 Body mass index (BMI) 34.0-34.9, adult: Secondary | ICD-10-CM | POA: Diagnosis not present

## 2018-05-12 DIAGNOSIS — E1310 Other specified diabetes mellitus with ketoacidosis without coma: Secondary | ICD-10-CM

## 2018-05-12 DIAGNOSIS — F411 Generalized anxiety disorder: Secondary | ICD-10-CM | POA: Diagnosis present

## 2018-05-12 DIAGNOSIS — H1089 Other conjunctivitis: Secondary | ICD-10-CM | POA: Diagnosis present

## 2018-05-12 DIAGNOSIS — E860 Dehydration: Secondary | ICD-10-CM | POA: Diagnosis present

## 2018-05-12 DIAGNOSIS — R739 Hyperglycemia, unspecified: Secondary | ICD-10-CM | POA: Diagnosis present

## 2018-05-12 DIAGNOSIS — E1110 Type 2 diabetes mellitus with ketoacidosis without coma: Secondary | ICD-10-CM | POA: Diagnosis present

## 2018-05-12 DIAGNOSIS — Z7984 Long term (current) use of oral hypoglycemic drugs: Secondary | ICD-10-CM | POA: Diagnosis not present

## 2018-05-12 DIAGNOSIS — Z79899 Other long term (current) drug therapy: Secondary | ICD-10-CM | POA: Diagnosis not present

## 2018-05-12 DIAGNOSIS — E876 Hypokalemia: Secondary | ICD-10-CM | POA: Diagnosis present

## 2018-05-12 DIAGNOSIS — E669 Obesity, unspecified: Secondary | ICD-10-CM | POA: Diagnosis present

## 2018-05-12 LAB — BASIC METABOLIC PANEL
ANION GAP: 16 — AB (ref 5–15)
Anion gap: 13 (ref 5–15)
Anion gap: 11 (ref 5–15)
Anion gap: 14 (ref 5–15)
BUN: 9 mg/dL (ref 6–20)
BUN: 8 mg/dL (ref 6–20)
BUN: 10 mg/dL (ref 6–20)
BUN: 10 mg/dL (ref 6–20)
CHLORIDE: 109 mmol/L (ref 98–111)
CO2: 13 mmol/L — ABNORMAL LOW (ref 22–32)
CO2: 13 mmol/L — ABNORMAL LOW (ref 22–32)
CO2: 14 mmol/L — ABNORMAL LOW (ref 22–32)
CO2: 17 mmol/L — ABNORMAL LOW (ref 22–32)
Calcium: 8 mg/dL — ABNORMAL LOW (ref 8.9–10.3)
Calcium: 8.1 mg/dL — ABNORMAL LOW (ref 8.9–10.3)
Calcium: 8.5 mg/dL — ABNORMAL LOW (ref 8.9–10.3)
Calcium: 8.2 mg/dL — ABNORMAL LOW (ref 8.9–10.3)
Chloride: 109 mmol/L (ref 98–111)
Chloride: 102 mmol/L (ref 98–111)
Chloride: 101 mmol/L (ref 98–111)
Creatinine, Ser: 0.81 mg/dL (ref 0.61–1.24)
Creatinine, Ser: 0.73 mg/dL (ref 0.61–1.24)
Creatinine, Ser: 0.76 mg/dL (ref 0.61–1.24)
Creatinine, Ser: 0.72 mg/dL (ref 0.61–1.24)
GFR calc Af Amer: >60 mL/min (ref >60)
GFR calc Af Amer: >60 mL/min (ref >60)
GFR calc Af Amer: >60 mL/min (ref >60)
GFR calc Af Amer: >60 mL/min (ref >60)
GFR calc non Af Amer: >60 mL/min (ref >60)
GFR calc non Af Amer: >60 mL/min (ref >60)
GFR calc non Af Amer: >60 mL/min (ref >60)
GFR calc non Af Amer: >60 mL/min (ref >60)
GLUCOSE: 208 mg/dL — AB (ref 70–99)
Glucose, Bld: 177 mg/dL — ABNORMAL HIGH (ref 70–99)
Glucose, Bld: 289 mg/dL — ABNORMAL HIGH (ref 70–99)
Glucose, Bld: 313 mg/dL — ABNORMAL HIGH (ref 70–99)
POTASSIUM: 3.3 mmol/L — AB (ref 3.5–5.1)
Potassium: 3.4 mmol/L — ABNORMAL LOW (ref 3.5–5.1)
Potassium: 3.5 mmol/L (ref 3.5–5.1)
Potassium: 3.5 mmol/L (ref 3.5–5.1)
Sodium: 135 mmol/L (ref 135–145)
Sodium: 133 mmol/L — ABNORMAL LOW (ref 135–145)
Sodium: 132 mmol/L — ABNORMAL LOW (ref 135–145)
Sodium: 132 mmol/L — ABNORMAL LOW (ref 135–145)

## 2018-05-12 LAB — CBC
HCT: 42.6 % (ref 39.0–52.0)
Hemoglobin: 14.6 g/dL (ref 13.0–17.0)
MCH: 30.2 pg (ref 26.0–34.0)
MCHC: 34.3 g/dL (ref 30.0–36.0)
MCV: 88 fL (ref 80.0–100.0)
Platelets: 215 10*3/uL (ref 150–400)
RBC: 4.84 MIL/uL (ref 4.22–5.81)
RDW: 12.5 % (ref 11.5–15.5)
WBC: 7.4 10*3/uL (ref 4.0–10.5)
nRBC: 0 % (ref 0.0–0.2)

## 2018-05-12 LAB — MAGNESIUM
Magnesium: 1.9 mg/dL (ref 1.7–2.4)
Magnesium: 2 mg/dL (ref 1.7–2.4)
Magnesium: 2.1 mg/dL (ref 1.7–2.4)

## 2018-05-12 LAB — HEMOGLOBIN A1C
Hgb A1c MFr Bld: 12.1 % — ABNORMAL HIGH (ref 4.8–5.6)
Mean Plasma Glucose: 300.57 mg/dL

## 2018-05-12 LAB — GLUCOSE, CAPILLARY
Glucose-Capillary: 162 mg/dL — ABNORMAL HIGH (ref 70–99)
Glucose-Capillary: 165 mg/dL — ABNORMAL HIGH (ref 70–99)
Glucose-Capillary: 174 mg/dL — ABNORMAL HIGH (ref 70–99)
Glucose-Capillary: 184 mg/dL — ABNORMAL HIGH (ref 70–99)
Glucose-Capillary: 191 mg/dL — ABNORMAL HIGH (ref 70–99)
Glucose-Capillary: 198 mg/dL — ABNORMAL HIGH (ref 70–99)
Glucose-Capillary: 266 mg/dL — ABNORMAL HIGH (ref 70–99)
Glucose-Capillary: 288 mg/dL — ABNORMAL HIGH (ref 70–99)
Glucose-Capillary: 310 mg/dL — ABNORMAL HIGH (ref 70–99)
Glucose-Capillary: 338 mg/dL — ABNORMAL HIGH (ref 70–99)

## 2018-05-12 LAB — HIV ANTIBODY (ROUTINE TESTING W REFLEX): HIV Screen 4th Generation wRfx: NONREACTIVE

## 2018-05-12 LAB — TSH: TSH: 4.744 u[IU]/mL — AB (ref 0.350–4.500)

## 2018-05-12 MED ORDER — POLYMYXIN B-TRIMETHOPRIM 10000-0.1 UNIT/ML-% OP SOLN
1.0000 [drp] | OPHTHALMIC | Status: DC
  Administered 2018-05-12 – 2018-05-14 (×14): 1 [drp] via OPHTHALMIC
  Filled 2018-05-12: qty 10

## 2018-05-12 MED ORDER — INSULIN DETEMIR 100 UNIT/ML ~~LOC~~ SOLN
12.0000 [IU] | Freq: Two times a day (BID) | SUBCUTANEOUS | Status: DC
  Administered 2018-05-12 – 2018-05-13 (×2): 12 [IU] via SUBCUTANEOUS
  Filled 2018-05-12 (×2): qty 0.12

## 2018-05-12 MED ORDER — INSULIN DETEMIR 100 UNIT/ML ~~LOC~~ SOLN
8.0000 [IU] | Freq: Two times a day (BID) | SUBCUTANEOUS | Status: DC
  Administered 2018-05-12 (×2): 8 [IU] via SUBCUTANEOUS
  Filled 2018-05-12 (×3): qty 0.08

## 2018-05-12 MED ORDER — TRAZODONE HCL 50 MG PO TABS
150.0000 mg | ORAL_TABLET | Freq: Every day | ORAL | Status: DC
  Administered 2018-05-12 – 2018-05-13 (×2): 150 mg via ORAL
  Filled 2018-05-12: qty 1
  Filled 2018-05-12: qty 3

## 2018-05-12 MED ORDER — ACETAMINOPHEN 325 MG PO TABS
650.0000 mg | ORAL_TABLET | Freq: Four times a day (QID) | ORAL | Status: DC | PRN
  Administered 2018-05-12: 650 mg via ORAL
  Filled 2018-05-12: qty 2

## 2018-05-12 MED ORDER — STERILE WATER FOR INJECTION IV SOLN
INTRAVENOUS | Status: AC
  Administered 2018-05-12 (×2): via INTRAVENOUS
  Filled 2018-05-12 (×3): qty 850

## 2018-05-12 MED ORDER — INSULIN ASPART 100 UNIT/ML ~~LOC~~ SOLN
0.0000 [IU] | Freq: Three times a day (TID) | SUBCUTANEOUS | Status: DC
  Administered 2018-05-12: 8 [IU] via SUBCUTANEOUS
  Administered 2018-05-12: 11 [IU] via SUBCUTANEOUS
  Administered 2018-05-12: 8 [IU] via SUBCUTANEOUS
  Administered 2018-05-13: 5 [IU] via SUBCUTANEOUS
  Administered 2018-05-13: 8 [IU] via SUBCUTANEOUS
  Administered 2018-05-13 – 2018-05-14 (×3): 5 [IU] via SUBCUTANEOUS

## 2018-05-12 MED ORDER — CLONAZEPAM 1 MG PO TABS
1.0000 mg | ORAL_TABLET | Freq: Two times a day (BID) | ORAL | Status: DC
  Administered 2018-05-12 – 2018-05-14 (×5): 1 mg via ORAL
  Filled 2018-05-12 (×5): qty 1

## 2018-05-12 MED ORDER — INSULIN ASPART 100 UNIT/ML ~~LOC~~ SOLN
4.0000 [IU] | Freq: Three times a day (TID) | SUBCUTANEOUS | Status: DC
  Administered 2018-05-12: 4 [IU] via SUBCUTANEOUS

## 2018-05-12 MED ORDER — LIVING WELL WITH DIABETES BOOK
Freq: Once | Status: AC
  Administered 2018-05-12: 11:00:00
  Filled 2018-05-12: qty 1

## 2018-05-12 MED ORDER — POTASSIUM CHLORIDE 10 MEQ/100ML IV SOLN
10.0000 meq | INTRAVENOUS | Status: AC
  Administered 2018-05-12 (×2): 10 meq via INTRAVENOUS
  Filled 2018-05-12 (×2): qty 100

## 2018-05-12 MED ORDER — INSULIN ASPART 100 UNIT/ML ~~LOC~~ SOLN
10.0000 [IU] | Freq: Once | SUBCUTANEOUS | Status: AC
  Administered 2018-05-12: 10 [IU] via SUBCUTANEOUS

## 2018-05-12 NOTE — Progress Notes (Signed)
Initial Nutrition Assessment  DOCUMENTATION CODES:   Obesity unspecified  INTERVENTION:  - Provided handouts following education. - Continue to encourage PO intakes. - Recommend outpatient appointment for ongoing DM diet education.    NUTRITION DIAGNOSIS:   Inadequate oral intake related to acute illness(newly dx DM) as evidenced by per patient/family report.  GOAL:   Patient will meet greater than or equal to 90% of their needs  MONITOR:   PO intake, Weight trends, Labs  REASON FOR ASSESSMENT:   Malnutrition Screening Tool, Consult Diet education  ASSESSMENT:   33 y.o. male with medical history significant for anxiety. He was admitted to [facility] on 1/9 by way of transfer from [facility] ED for further evaluation and management of DKA in the setting of new diagnosis of DM.  No intakes documented since advancement of diet overnight from NPO to Carb Modified. Patient was able to consume 100% of lunch without issue. He reports that he began "feeling bad" ~3 months ago and that he progressively felt more lethargic, excessive thirst, and difficulty tolerating solid foods over the past 1 month. He was mainly consuming water, smoothies, and items that required minimal to no chewing. He denies abdominal pain at any point but does report nausea over the past month; denies nausea today.   Patient reports that he was officially dx with DM yesterday and has not had any nutrition-related information provided to him prior to RD visit.  Patient reports losing ~30 lb in the past 3 months. Per chart review, current weight is 255 lb which would indicate 10% body weight loss in the past 3 months. This is significant for time frame.     Lab Results  Component Value Date   HGBA1C 12.1 (H) 05/12/2018    RD provided "Carbohydrate Counting for People with Diabetes" and "Label Reading Tips" handouts from the Academy of Nutrition and Dietetics. Discussed different food groups and their  effects on blood sugar, emphasizing carbohydrate-containing foods. Provided list of carbohydrates and recommended serving sizes of common foods.  Discussed importance of controlled and consistent carbohydrate intake throughout the day. Provided examples of ways to balance meals/snacks and encouraged intake of high-fiber, whole grain complex carbohydrates. Also discussed the importance of exercise. Patient currently goes to the gym but would like to increase his exercise regimen.  Teach back method used. Expect good compliance.   Medications reviewed; sliding scale novolog, 8 units levemir BID, 10 mEq IV KCl x2 runs 1/9 and x2 runs 1/10 Labs reviewed; CBGs: 162-310 mg/dL since midnight, Na: 616 mmol/L, Ca: 8.1 mg/dL. IVF; 150 mEq sodium bicarb in sterile water @ 75 mL/hr.     NUTRITION - FOCUSED PHYSICAL EXAM:    Most Recent Value  Orbital Region  No depletion  Upper Arm Region  No depletion  Thoracic and Lumbar Region  Unable to assess  Buccal Region  No depletion  Temple Region  No depletion  Clavicle Bone Region  No depletion  Clavicle and Acromion Bone Region  No depletion  Scapular Bone Region  No depletion  Dorsal Hand  No depletion  Patellar Region  Unable to assess  Anterior Thigh Region  Unable to assess  Posterior Calf Region  Unable to assess  Edema (RD Assessment)  Unable to assess  Hair  Reviewed  Eyes  Reviewed  Mouth  Reviewed  Skin  Reviewed  Nails  Reviewed       Diet Order:   Diet Order  Diet Carb Modified Fluid consistency: Thin; Room service appropriate? Yes  Diet effective now              EDUCATION NEEDS:   Education needs have been addressed  Skin:  Skin Assessment: Reviewed RN Assessment  Last BM:  1/9  Height:   Ht Readings from Last 1 Encounters:  05/11/18 6' (1.829 m)    Weight:   Wt Readings from Last 1 Encounters:  05/11/18 115.7 kg    Ideal Body Weight:  80.91 kg  BMI:  Body mass index is 34.58  kg/m.  Estimated Nutritional Needs:   Kcal:  2080-2250 kcal  Protein:  90-100 grams  Fluid:  >/= 2 L/day     Trenton Gammon, MS, RD, LDN, [facility] Inpatient Clinical Dietitian Pager # [phone] After hours/weekend pager # [phone]

## 2018-05-12 NOTE — Progress Notes (Signed)
Pt's mother took his wallet home with her at patients request.

## 2018-05-12 NOTE — Progress Notes (Signed)
PROGRESS NOTE  Perry Li TGY:[id number] DOB: April 13, 1986 DOA: 05/11/2018 PCP: Pearline Cables, MD  HPI/Recap of past 24 hours: Perry Li is a 33 y.o. male with medical history significant for anxiety, who is admitted to [facility] long hospital on 05/11/2018 by way of transfer from med [facility] emergency department for further evaluation and management of DKA in the setting of new diagnosis of diabetes after presenting to the latter facility for further evaluation of hyperglycemia.   05/12/18: Patient seen and examined at his bedside.  Purulent drainage noted in his eyes bilaterally.  Treated with ophthalmic drops.  Anion gap has closed and has been transitioned to long-acting insulin.  Diabetes coordinator has been consulted for insulin education.  Awaiting A1c and results of anti-islet cell antibodies.  Reports mild nausea with no vomiting.  Assessment/Plan: Principal Problem:   DKA (diabetic ketoacidoses) (HCC) Active Problems:   Anxiety   Dehydration   Hyperglycemia  DKA, unspecified type Anti- islet cell antibodies pending Presumed type 2 diabetes with obesity and elevated A1C 12.1 Increase Levemir to 12 units twice daily Start NovoLog 4 units before meals Continue insulin sliding scale moderate Diabetes coordinator consulted for insulin education and management Low-carb diabetes diet  Non-anion gap metabolic acidosis Chemistry bicarb 13 Anion gap is closing at 11 Start isotonic bicarb Repeat BMP in the morning  Suspected bilateral bacterial conjunctivitis Continue Polytrim ophthalmic solution drops x7 days Monitor for clearance of purulent drainage Monitor for loss of vision  Recent balanitis Self-reported Was treated outpatient approximately 6 weeks ago  Generalized anxiety Resume home medications trazodone and Klonopin  Obesity BMI 34 Recommend weight loss outpatient   Code Status: Full code  Family Communication: None at  bedside  Disposition Plan: Home possibly tomorrow 05/13/2018   Consultants:  Diabetes coordinator  Procedures: None  Antimicrobials:  Ophthalmic drops for suspected acute bilateral conjunctivitis  DVT prophylaxis: Subcu Lovenox daily   Objective: Vitals:   05/12/18 0900 05/12/18 1000 05/12/18 1100 05/12/18 1200  BP: (!) 119/49 130/79 137/76 129/63  Pulse: 94 98 99 93  Resp: 20 16 13 18   Temp:    97.9 F (36.6 C)  TempSrc:    Oral  SpO2: 97% 98% 98% 99%  Weight:      Height:        Intake/Output Summary (Last 24 hours) at 05/12/2018 1629 Last data filed at 05/12/2018 1200 Gross per 24 hour  Intake 4417.03 ml  Output 800 ml  Net 3617.03 ml   Filed Weights   05/11/18 1458  Weight: 115.7 kg    Exam:  . General: 33 y.o. year-old male well developed well nourished in no acute distress.  Alert and oriented x3.  Purulent drainage noted from eyes bilaterally. . Cardiovascular: Regular rate and rhythm with no rubs or gallops.  No thyromegaly or JVD noted.   Marland Kitchen Respiratory: Clear to auscultation with no wheezes or rales. Good inspiratory effort. . Abdomen: Soft nontender nondistended with normal bowel sounds x4 quadrants. . Musculoskeletal: No lower extremity edema. 2/4 pulses in all 4 extremities. . Skin: No ulcerative lesions noted or rashes, . Psychiatry: Mood is appropriate for condition and setting   Data Reviewed: CBC: Recent Labs  Lab 05/11/18 1541 05/12/18 0512  WBC 10.2 7.4  NEUTROABS 8.1*  --   HGB 17.3* 14.6  HCT 51.3 42.6  MCV 89.4 88.0  PLT 293 215   Basic Metabolic Panel: Recent Labs  Lab 05/11/18 1541 05/11/18 2047 05/12/18 0034 05/12/18 0512  NA  128* 133* 135 133*  K 4.3 3.5 3.4* 3.5  CL 97* 108 109 109  CO2 10* 9* 13* 13*  GLUCOSE 422* 244* 208* 177*  BUN 14 10 9 8   CREATININE 1.08 0.87 0.81 0.73  CALCIUM 9.0 7.9* 8.0* 8.1*  MG  --   --  1.9 2.0   GFR: Estimated Creatinine Clearance: 177.2 mL/min (by C-G formula based on SCr of  0.73 mg/dL). Liver Function Tests: Recent Labs  Lab 05/11/18 1541  AST 39  ALT 129*  ALKPHOS 118  BILITOT 1.4*  PROT 8.2*  ALBUMIN 4.7   No results for input(s): LIPASE, AMYLASE in the last 168 hours. No results for input(s): AMMONIA in the last 168 hours. Coagulation Profile: No results for input(s): INR, PROTIME in the last 168 hours. Cardiac Enzymes: No results for input(s): CKTOTAL, CKMB, CKMBINDEX, TROPONINI in the last 168 hours. BNP (last 3 results) No results for input(s): PROBNP in the last 8760 hours. HbA1C: Recent Labs    05/12/18 0512  HGBA1C 12.1*   CBG: Recent Labs  Lab 05/12/18 0314 05/12/18 0430 05/12/18 0518 05/12/18 0812 05/12/18 1257  GLUCAP 165* 198* 174* 310* 266*   Lipid Profile: No results for input(s): CHOL, HDL, LDLCALC, TRIG, CHOLHDL, LDLDIRECT in the last 72 hours. Thyroid Function Tests: Recent Labs    05/12/18 0512  TSH 4.744*   Anemia Panel: No results for input(s): VITAMINB12, FOLATE, FERRITIN, TIBC, IRON, RETICCTPCT in the last 72 hours. Urine analysis:    Component Value Date/Time   BILIRUBINUR negative 05/11/2018 1418   KETONESUR large (80) (A) 05/11/2018 1418   PROTEINUR =30 (A) 05/11/2018 1418   UROBILINOGEN 0.2 05/11/2018 1418   NITRITE Negative 05/11/2018 1418   LEUKOCYTESUR Negative 05/11/2018 1418   Sepsis Labs: @LABRCNTIP (procalcitonin:4,lacticidven:4)  ) Recent Results (from the past 240 hour(s))  MRSA PCR Screening     Status: None   Collection Time: 05/11/18  8:42 PM  Result Value Ref Range Status   MRSA by PCR NEGATIVE NEGATIVE Final    Comment:        The GeneXpert MRSA Assay (FDA approved for NASAL specimens only), is one component of a comprehensive MRSA colonization surveillance program. It is not intended to diagnose MRSA infection nor to guide or monitor treatment for MRSA infections. Performed at [facility], [address], [place], [place] [address]        Studies: No results found.  Scheduled Meds: . clonazePAM  1 mg Oral BID  . enoxaparin (LOVENOX) injection  40 mg Subcutaneous Q24H  . insulin aspart  0-15 Units Subcutaneous TID WC  . insulin detemir  8 Units Subcutaneous BID  . traZODone  150 mg Oral QHS  . trimethoprim-polymyxin b  1 drop Both Eyes Q4H    Continuous Infusions: .  sodium bicarbonate (isotonic) infusion in sterile water 75 mL/hr at 05/12/18 1200     LOS: 0 days     Darlin Drop, MD [facility] Pager [phone]  If 7PM-7AM, please contact night-coverage [email or web address] Password [facility] 05/12/2018, 4:29 PM

## 2018-05-12 NOTE — Plan of Care (Signed)
Spent time with patient educating on the types of insuling, timing, blood sugar checks, and insulin administration. Pt watched nurse and then pricked his own finger to check CBG. PT self educating on diet and exercise needs also. Will continue to educate.

## 2018-05-12 NOTE — Progress Notes (Signed)
Brief note regarding plan, with full H&P to follow:   Perry Li is a 34 y.o. male with medical history significant for anxiety, who is admitted to [facility] long hospital on 05/11/2018 by way of transfer from med [facility] emergency department for further evaluation and management of DKA in the setting of new diagnosis of diabetes after presenting to the latter facility for further evaluation of hyperglycemia.    #) Diabetic ketoacidosis: In the setting of newly diagnosed diabetes for which the patient was sent by his PCP to the emergency department for further evaluation of hyperglycemia, presenting labs notable for presenting blood sugar 422, VBG reflecting metabolic acidosis, anion gap 21, corrected sodium 133, potassium 4.3.  Of note, neither serum nor urine ketones were checked in the ED. while still at [facility] emergency department, the patient received a 2 L bolus of IV normal saline and was started on insulin drip.  Per repeat BMP performed upon arrival at [facility], anion gap improving in the interval, but has not yet closed.  As the patient has no prior diagnosis of diabetes leading up today, he will require assistance from the diabetic educator, and I have placed a inpatient consult for such.  No other obvious inciting factors leading to DKA presentation other than the presence of undiagnosed diabetes, with presence of corresponding symptoms over the last 1 to 2 months.   Plan: As updated blood sugars found to be less than 250, will transition IV fluids to D5 half-normal saline, and also initiate IV potassium supplementation given finding of serum potassium level less than 5.  Every 4 hours BMPs, with attention to subsequent anion gap as well as ensuing potassium level.  Continue insulin drip per protocol, with every hour Accu-Cheks.  Will keep n.p.o. except for ice chips for now.  Once anion gap closes and patient tolerating diet, will start subcutaneous  insulin.  Anticipate starting the patient on Levemir 10 units subcu twice daily.  We will continue insulin drip for 2 hours beyond the initiation of subcutaneous insulin.  I have placed a consult for the diabetic educator to see the patient in the morning.  Check hemoglobin A1c.  Check anti-beta islet cell antibodies.   Newton Pigg, DO Hospitalist

## 2018-05-12 NOTE — Progress Notes (Signed)
Pt given levimir.  Pt tolerated jello well.  He has been resting quietly.  Pt aware of recent lab results and transitioning off insulin drip within the next couple of hours

## 2018-05-12 NOTE — Progress Notes (Signed)
Inpatient Diabetes Program Recommendations  AACE/ADA: New Consensus Statement on Inpatient Glycemic Control (2015)  Target Ranges:  Prepandial:   less than 140 mg/dL      Peak postprandial:   less than 180 mg/dL (1-2 hours)      Critically ill patients:  140 - 180 mg/dL   Lab Results  Component Value Date   GLUCAP 288 (H) 05/12/2018   HGBA1C 12.1 (H) 05/12/2018    Review of Glycemic Control  Educated patient on insulin pen use at home. Reviewed contents of insulin flexpen starter kit. Reviewed all steps if insulin pen including attachment of needle, 2-unit air shot, dialing up dose, giving injection, removing needle, disposal of sharps, storage of unused insulin, disposal of insulin etc. Patient able to provide successful return demonstration. Also reviewed troubleshooting with insulin pen. MD to give patient Rxs for insulin pens and insulin pen needles.  Thank you. Lorenda Peck, RD, LDN, CDE Inpatient Diabetes Coordinator [phone]

## 2018-05-12 NOTE — Progress Notes (Signed)
Pt Insulin drip off.  Pt eating crackers.

## 2018-05-12 NOTE — Progress Notes (Signed)
Inpatient Diabetes Program Recommendations  AACE/ADA: New Consensus Statement on Inpatient Glycemic Control (2015)  Target Ranges:  Prepandial:   less than 140 mg/dL      Peak postprandial:   less than 180 mg/dL (1-2 hours)      Critically ill patients:  140 - 180 mg/dL   Lab Results  Component Value Date   GLUCAP 266 (H) 05/12/2018   HGBA1C 12.1 (H) 05/12/2018    Review of Glycemic Control  Diabetes history: New onset DM Outpatient Diabetes medications: N/A Current orders for Inpatient glycemic control: Levemir 12 units bid, Novolog 0-15 units tidwc + 4 units tidwc  HgbA1C - 12.1%. Has PCP and appt for f/u next week.  Inpatient Diabetes Program Recommendations:     Increased Levemir to 12 units bid. Needs meal coverage insulin - add Novolog 6 units tidwc if pt eats > 50% meal.  Spoke with patient about new diabetes diagnosis.  Discussed A1C results (12.1%) and explained what an A1C is and informed patient that his current A1C indicates an average glucose of 280 mg/dl over the past 2-3 months. Discussed basic pathophysiology of DM Type 2, basic home care, importance of checking CBGs and maintaining good CBG control to prevent long-term and short-term complications. Reviewed glucose and A1C goals.  Reviewed signs and symptoms of hyperglycemia and hypoglycemia along with treatment for both. Discussed impact of nutrition, exercise, stress, sickness, and medications on diabetes control. Reviewed Living Well with diabetes booklet and encouraged patient to read through entire book.  Discussed glucose monitoring and gave info on Canones. Asked patient to check his glucose 4 times per day (before meals and at bedtime) and to keep a log book of glucose readings and insulin taken. Explained how his doctor will use the log book to continue to make insulin adjustments if needed. Informed patient that RN will be asking him to self-administer insulin to ensure proper technique and ability to administer  self insulin shots.   Patient verbalized understanding of information discussed and he states that he has no further questions at this time related to diabetes.   RNs to provide ongoing basic DM education at bedside with this patient and engage patient to actively check blood glucose and administer insulin injections.   Will need prescription for glucose meter and supplies and insulin pen needles.  Will order OP Diabetes Education consult for new-onset DM.  Discussed above with RN.   Thank you. Ailene Ards, RD, LDN, CDE Inpatient Diabetes Coordinator [phone]

## 2018-05-13 LAB — CBC
HCT: 41.4 % (ref 39.0–52.0)
Hemoglobin: 14.2 g/dL (ref 13.0–17.0)
MCH: 30.5 pg (ref 26.0–34.0)
MCHC: 34.3 g/dL (ref 30.0–36.0)
MCV: 88.8 fL (ref 80.0–100.0)
Platelets: 207 10*3/uL (ref 150–400)
RBC: 4.66 MIL/uL (ref 4.22–5.81)
RDW: 12.5 % (ref 11.5–15.5)
WBC: 6.9 10*3/uL (ref 4.0–10.5)
nRBC: 0 % (ref 0.0–0.2)

## 2018-05-13 LAB — BASIC METABOLIC PANEL
Anion gap: 14 (ref 5–15)
Anion gap: 15 (ref 5–15)
Anion gap: 14 (ref 5–15)
Anion gap: 11 (ref 5–15)
Anion gap: 11 (ref 5–15)
BUN: 8 mg/dL (ref 6–20)
BUN: 7 mg/dL (ref 6–20)
BUN: 10 mg/dL (ref 6–20)
BUN: 10 mg/dL (ref 6–20)
BUN: 11 mg/dL (ref 6–20)
CALCIUM: 8.7 mg/dL — AB (ref 8.9–10.3)
CO2: 19 mmol/L — ABNORMAL LOW (ref 22–32)
CO2: 21 mmol/L — ABNORMAL LOW (ref 22–32)
CO2: 19 mmol/L — AB (ref 22–32)
CO2: 22 mmol/L (ref 22–32)
CO2: 24 mmol/L (ref 22–32)
Calcium: 8.2 mg/dL — ABNORMAL LOW (ref 8.9–10.3)
Calcium: 8.2 mg/dL — ABNORMAL LOW (ref 8.9–10.3)
Calcium: 8.5 mg/dL — ABNORMAL LOW (ref 8.9–10.3)
Calcium: 8.5 mg/dL — ABNORMAL LOW (ref 8.9–10.3)
Chloride: 102 mmol/L (ref 98–111)
Chloride: 98 mmol/L (ref 98–111)
Chloride: 100 mmol/L (ref 98–111)
Chloride: 101 mmol/L (ref 98–111)
Chloride: 101 mmol/L (ref 98–111)
Creatinine, Ser: 0.69 mg/dL (ref 0.61–1.24)
Creatinine, Ser: 0.79 mg/dL (ref 0.61–1.24)
Creatinine, Ser: 0.72 mg/dL (ref 0.61–1.24)
Creatinine, Ser: 0.71 mg/dL (ref 0.61–1.24)
Creatinine, Ser: 0.72 mg/dL (ref 0.61–1.24)
GFR calc Af Amer: >60 mL/min (ref >60)
GFR calc Af Amer: >60 mL/min (ref >60)
GFR calc Af Amer: >60 mL/min (ref >60)
GFR calc Af Amer: >60 mL/min (ref >60)
GFR calc Af Amer: >60 mL/min (ref >60)
GFR calc non Af Amer: >60 mL/min (ref >60)
GFR calc non Af Amer: >60 mL/min (ref >60)
GFR calc non Af Amer: >60 mL/min (ref >60)
GFR calc non Af Amer: >60 mL/min (ref >60)
Glucose, Bld: 239 mg/dL — ABNORMAL HIGH (ref 70–99)
Glucose, Bld: 290 mg/dL — ABNORMAL HIGH (ref 70–99)
Glucose, Bld: 249 mg/dL — ABNORMAL HIGH (ref 70–99)
Glucose, Bld: 318 mg/dL — ABNORMAL HIGH (ref 70–99)
Glucose, Bld: 274 mg/dL — ABNORMAL HIGH (ref 70–99)
POTASSIUM: 3.4 mmol/L — AB (ref 3.5–5.1)
Potassium: 2.8 mmol/L — ABNORMAL LOW (ref 3.5–5.1)
Potassium: 3.2 mmol/L — ABNORMAL LOW (ref 3.5–5.1)
Potassium: 3.3 mmol/L — ABNORMAL LOW (ref 3.5–5.1)
Potassium: 3.8 mmol/L (ref 3.5–5.1)
SODIUM: 135 mmol/L (ref 135–145)
Sodium: 134 mmol/L — ABNORMAL LOW (ref 135–145)
Sodium: 133 mmol/L — ABNORMAL LOW (ref 135–145)
Sodium: 134 mmol/L — ABNORMAL LOW (ref 135–145)
Sodium: 136 mmol/L (ref 135–145)

## 2018-05-13 LAB — GLUCOSE, CAPILLARY
Glucose-Capillary: 262 mg/dL — ABNORMAL HIGH (ref 70–99)
Glucose-Capillary: 250 mg/dL — ABNORMAL HIGH (ref 70–99)
Glucose-Capillary: 225 mg/dL — ABNORMAL HIGH (ref 70–99)
Glucose-Capillary: 288 mg/dL — ABNORMAL HIGH (ref 70–99)

## 2018-05-13 MED ORDER — POTASSIUM CHLORIDE CRYS ER 20 MEQ PO TBCR: 40.0000 meq | EXTENDED_RELEASE_TABLET | Freq: Three times a day (TID) | ORAL | Status: DC

## 2018-05-13 MED ORDER — POTASSIUM CHLORIDE CRYS ER 20 MEQ PO TBCR
40.0000 meq | EXTENDED_RELEASE_TABLET | Freq: Three times a day (TID) | ORAL | Status: AC
  Administered 2018-05-13 (×3): 40 meq via ORAL
  Filled 2018-05-13 (×3): qty 2

## 2018-05-13 MED ORDER — STERILE WATER FOR INJECTION IV SOLN
INTRAVENOUS | Status: DC
  Administered 2018-05-13 (×2): via INTRAVENOUS
  Filled 2018-05-13 (×2): qty 850

## 2018-05-13 MED ORDER — INSULIN ASPART 100 UNIT/ML ~~LOC~~ SOLN
10.0000 [IU] | Freq: Three times a day (TID) | SUBCUTANEOUS | Status: DC
  Administered 2018-05-13: 10 [IU] via SUBCUTANEOUS

## 2018-05-13 MED ORDER — INSULIN DETEMIR 100 UNIT/ML ~~LOC~~ SOLN
14.0000 [IU] | Freq: Two times a day (BID) | SUBCUTANEOUS | Status: DC
  Administered 2018-05-13 – 2018-05-14 (×2): 14 [IU] via SUBCUTANEOUS
  Filled 2018-05-13 (×3): qty 0.14

## 2018-05-13 MED ORDER — INSULIN ASPART 100 UNIT/ML ~~LOC~~ SOLN: 6.0000 [IU] | Freq: Three times a day (TID) | SUBCUTANEOUS | Status: DC

## 2018-05-13 MED ORDER — INSULIN ASPART 100 UNIT/ML ~~LOC~~ SOLN
8.0000 [IU] | Freq: Three times a day (TID) | SUBCUTANEOUS | Status: DC
  Administered 2018-05-13: 8 [IU] via SUBCUTANEOUS

## 2018-05-13 MED ORDER — INSULIN ASPART 100 UNIT/ML ~~LOC~~ SOLN
12.0000 [IU] | Freq: Three times a day (TID) | SUBCUTANEOUS | Status: DC
  Administered 2018-05-13 – 2018-05-14 (×3): 12 [IU] via SUBCUTANEOUS

## 2018-05-13 NOTE — Plan of Care (Signed)
Patient denies pain, tolerating carb modified diet.  Patient able to give himself insulin with correct technique.  Hopeful for discharge 05/14/2018.  Is very receptive to education regarding his diabetes.

## 2018-05-13 NOTE — Progress Notes (Addendum)
PROGRESS NOTE  Perry Li WLN:[id number] DOB: May 30, 1985 DOA: 05/11/2018 PCP: Pearline Cables, MD  HPI/Recap of past 24 hours: Perry Li is a 33 y.o. male with medical history significant for anxiety, who is admitted to [facility] long hospital on 05/11/2018 by way of transfer from med [facility] emergency department for further evaluation and management of DKA in the setting of new diagnosis of diabetes after presenting to the latter facility for further evaluation of hyperglycemia.   05/12/18: Patient seen and examined at his bedside.  Purulent drainage noted in his eyes bilaterally.  Treated with ophthalmic drops.  Anion gap has closed and has been transitioned to long-acting insulin.  Diabetes coordinator has been consulted for insulin education.  Awaiting A1c and results of anti-islet cell antibodies.  Reports mild nausea with no vomiting.  05/13/18: No new complaints. But persistent hyperglycemia requiring insulin adjustment. Also dyselectrolytemia. Continue inpt treatment.  Assessment/Plan: Principal Problem:   DKA (diabetic ketoacidoses) (HCC) Active Problems:   Anxiety   Dehydration   Hyperglycemia  Newly diagnosed DKA, unspecified type Anti- islet cell antibodies pending Presumed type 2 diabetes with obesity and elevated A1C 12.1 Increase Levemir to 14 units twice daily Increase NovoLog 10 units before meals Continue insulin sliding scale moderate Diabetes coordinator consulted for insulin education and management Low-carb diabetes diet C/ education w nursing assistance  Newly diagnosed Diabetes Management as stated above Will need compliance to insulin regimen and lifetime modification with weight loss and reg physical activity  Anion gap metabolic acidosis Chemistry bicarb improving from 13 to 21 on isotonic drip Anion gap is 15, chemistry BS 290 Increase insulin doses  Repeat BMP in the morning  Hypokalemia K+ 2.8 Replete as indicated BMP and  mg level am  Suspected bilateral bacterial conjunctivitis Continue Polytrim ophthalmic solution drops x7 days Monitor for clearance of purulent drainage Monitor for loss of vision  Recent balanitis Self-reported Was treated outpatient approximately 6 weeks ago  Generalized anxiety C/w home medications trazodone and Klonopin  Obesity BMI 34 Recommend weight loss outpatient   Code Status: Full code  Family Communication: None at bedside  Disposition Plan: Home possibly tomorrow 05/13/2018   Consultants:  Diabetes coordinator  Procedures: None  Antimicrobials:  Ophthalmic drops for suspected acute bilateral conjunctivitis  DVT prophylaxis: Subcu Lovenox daily   Objective: Vitals:   05/13/18 0355 05/13/18 0413 05/13/18 0800 05/13/18 0837  BP:    (!) 121/46  Pulse:      Resp:    19  Temp:  (!) 97.5 F (36.4 C) 98 F (36.7 C)   TempSrc:  Oral Oral   SpO2:    98%  Weight: 113.6 kg     Height:        Intake/Output Summary (Last 24 hours) at 05/13/2018 1150 Last data filed at 05/13/2018 0650 Gross per 24 hour  Intake 1551.62 ml  Output 3375 ml  Net -1823.38 ml   Filed Weights   05/11/18 1458 05/13/18 0355  Weight: 115.7 kg 113.6 kg    Exam:  . General: 33 y.o. year-old male WD WN nAD A&O x3 . Cardiovascular: RRR no rubs or gallops no jvd or thyromegaly . Respiratory: CTA no wheezes or rales . Abdomen: Soft nontender nondistended with normal bowel sounds x4 quadrants. . Musculoskeletal: No lower extremity edema. 2/4 pulses in all 4 extremities. . Skin: No ulcerative lesions noted or rashes, . Psychiatry: Mood is appropriate for condition and setting   Data Reviewed: CBC: Recent Labs  Lab 05/11/18  1541 05/12/18 0512 05/13/18 0309  WBC 10.2 7.4 6.9  NEUTROABS 8.1*  --   --   HGB 17.3* 14.6 14.2  HCT 51.3 42.6 41.4  MCV 89.4 88.0 88.8  PLT 293 215 207   Basic Metabolic Panel: Recent Labs  Lab 05/12/18 0034 05/12/18 0512 05/12/18 1751  05/12/18 2206 05/13/18 0309 05/13/18 0820  NA 135 133* 132* 132* 135 134*  K 3.4* 3.5 3.5 3.3* 2.8* 3.2*  CL 109 109 102 101 102 98  CO2 13* 13* 14* 17* 19* 21*  GLUCOSE 208* 177* 289* 313* 239* 290*  BUN 9 8 10 10 8 7   CREATININE 0.81 0.73 0.76 0.72 0.69 0.79  CALCIUM 8.0* 8.1* 8.5* 8.2* 8.2* 8.2*  MG 1.9 2.0 2.1  --   --   --    GFR: Estimated Creatinine Clearance: 175.7 mL/min (by C-G formula based on SCr of 0.79 mg/dL). Liver Function Tests: Recent Labs  Lab 05/11/18 1541  AST 39  ALT 129*  ALKPHOS 118  BILITOT 1.4*  PROT 8.2*  ALBUMIN 4.7   No results for input(s): LIPASE, AMYLASE in the last 168 hours. No results for input(s): AMMONIA in the last 168 hours. Coagulation Profile: No results for input(s): INR, PROTIME in the last 168 hours. Cardiac Enzymes: No results for input(s): CKTOTAL, CKMB, CKMBINDEX, TROPONINI in the last 168 hours. BNP (last 3 results) No results for input(s): PROBNP in the last 8760 hours. HbA1C: Recent Labs    05/12/18 0512  HGBA1C 12.1*   CBG: Recent Labs  Lab 05/12/18 0812 05/12/18 1257 05/12/18 1735 05/12/18 2136 05/13/18 0802  GLUCAP 310* 266* 288* 338* 262*   Lipid Profile: No results for input(s): CHOL, HDL, LDLCALC, TRIG, CHOLHDL, LDLDIRECT in the last 72 hours. Thyroid Function Tests: Recent Labs    05/12/18 0512  TSH 4.744*   Anemia Panel: No results for input(s): VITAMINB12, FOLATE, FERRITIN, TIBC, IRON, RETICCTPCT in the last 72 hours. Urine analysis:    Component Value Date/Time   BILIRUBINUR negative 05/11/2018 1418   KETONESUR large (80) (A) 05/11/2018 1418   PROTEINUR =30 (A) 05/11/2018 1418   UROBILINOGEN 0.2 05/11/2018 1418   NITRITE Negative 05/11/2018 1418   LEUKOCYTESUR Negative 05/11/2018 1418   Sepsis Labs: @LABRCNTIP (procalcitonin:4,lacticidven:4)  ) Recent Results (from the past 240 hour(s))  MRSA PCR Screening     Status: None   Collection Time: 05/11/18  8:42 PM  Result Value Ref  Range Status   MRSA by PCR NEGATIVE NEGATIVE Final    Comment:        The GeneXpert MRSA Assay (FDA approved for NASAL specimens only), is one component of a comprehensive MRSA colonization surveillance program. It is not intended to diagnose MRSA infection nor to guide or monitor treatment for MRSA infections. Performed at [facility], [address] [place], [place] [address]       Studies: No results found.  Scheduled Meds: . clonazePAM  1 mg Oral BID  . enoxaparin (LOVENOX) injection  40 mg Subcutaneous Q24H  . insulin aspart  0-15 Units Subcutaneous TID WC  . insulin aspart  10 Units Subcutaneous TID WC  . insulin detemir  14 Units Subcutaneous BID  . potassium chloride  40 mEq Oral TID  . traZODone  150 mg Oral QHS  . trimethoprim-polymyxin b  1 drop Both Eyes Q4H    Continuous Infusions:    LOS: 1 day     Darlin Drop, MD [facility] Pager [phone]  If  7PM-7AM, please contact night-coverage [email or web address] Password [email or web address] 05/13/2018, 11:50 AM

## 2018-05-13 NOTE — Progress Notes (Addendum)
Perry Li at [facility] [address], [place], [place] [address] [phone]  Date:  05/18/2018   Name:  Aneesh Faller   DOB:  03-30-1986   MRN:  [id number]  PCP:  Darreld Mclean, MD    Chief Complaint: Diabetes (hospital follow up, DKA, needing lab work)   History of Present Illness:  Perry Li is a 33 y.o. very pleasant male patient who presents with the following:  Patient was seen last week with newly diagnosed diabetes.  He was feeling fairly ill, so we sent him to the emergency room for further evaluation.  He ended up being admitted to the hospital with DKA  Lab Results  Component Value Date   HGBA1C 12.1 (H) 05/12/2018   Admit date: 05/11/2018 Discharge date: 05/14/2018 Discharge Diagnoses:      Active Hospital Problems   Diagnosis Date Noted  . DKA (diabetic ketoacidoses) ([place]) 05/11/2018  . Dehydration 05/12/2018  . Hyperglycemia 05/12/2018  . Anxiety     Resolved Hospital Problems  No resolved problems to display.   History of present illness:  Perry Li is a 33 y.o. malewith medical history significant for anxiety, who is admitted to [facility] long hospital on 05/11/2018 by way of transfer from [place] emergency department for further evaluation and management of DKA in the setting of new diagnosis of presumed type II diabetes after presenting to the latter facility for further evaluation of hyperglycemia.  05/12/18:Purulent drainage noted in his eyes bilaterally. Treated with ophthalmic drops. Anion gap has closed and has been transitioned to long-acting insulin. Diabetes coordinator has been consulted for insulin education. Awaiting A1c and results of anti-islet cell antibodies. Reports mild nausea with no vomiting. 05/13/18:No new complaints. But persistent hyperglycemia requiring insulin adjustment. Also dyselectrolytemia. Continue inpt treatment.  05/14/2018: Patient  seen and examined at bedside.  No acute events overnight.  His anion gap is closed and his chemistry bicarb has normalized.  He denies any nausea, abdominal pain or dizziness.  He has no new complaints.  Tolerating a diet well.  He has been educated by diabetes coordinator and nursing staff on how to use insulin. On the day of discharge, the patient was hemodynamically stable.  He will need to follow-up with his primary care provider and also with diabetes coordinator outpatient.  Hospital Course:  Principal Problem:   DKA (diabetic ketoacidoses) (HCC) Active Problems:   Anxiety   Dehydration   Hyperglycemia  Newly diagnosedDKA, unspecified type with hyperglycemia Anti- islet cell antibodies pending Presumed type 2 diabetes with obesity and elevated A1C 12.1 Increase Levemir to 14units twice daily IncreaseNovoLog 12units before meals Diabetes coordinator consulted for insulin education and management Low-carb diabetes diet C/ education with diabetes coordinator outpatient Follow-up with your PCP within a week Newly diagnosed Diabetes, presumed type II Management as stated above Will need compliance to insulin regimen and lifetime modification with weight loss and reg physical activity Follow-up with your PCP within a week Follow-up with diabetes coordinator outpatient Resolved anion gap metabolic acidosis Resolving hypokalemia post repletion  Continue potassium supplement 20 mEq daily x7 days Follow-up with your PCP Repeat BMP on Wednesday, 05/17/2018 Acute bilateral bacterial conjunctivitis, resolving Continue Polytrim ophthalmic solution drops x7 days.  End date 05/18/2018. Monitor for clearance of purulent drainage No reported loss of vision. Recent balanitis Self-reported Was treated outpatient approximately 6 weeks ago Generalized anxiety C/whome medications trazodone and Klonopin Obesity BMI 34 Recommend weight loss outpatient  with regular physical activity and  healthy dieting. Follow-up with your PCP   He has plenty of his 2 types of insulin on hand but notes that they are expensive and he is not sure he can afford long term  He is watching his carbs- eating more protein and vegetables He shared his daily meal plan with me and it sounds appropriate  Current insulin regimen:  levemir 14 u BID novolog 12 TID with meals  Wt Readings from Last 3 Encounters:  05/18/18 264 lb (119.7 kg)  05/16/18 260 lb 11.2 oz (118.3 kg)  05/14/18 259 lb (117.5 kg)   He is checking his glucose at home; his home glucose readings are still in the 200s but getting better He has only been checking his sugar around 6pm- no fasting readings so far  He has not noted any sx of low glucose   He has been to see a nutritionist but not yet seen by endocrinology   His conjunctivitis is cleared up  He did start his metformin; he had some diarrhea for the first 2 days only He is taking 500 mg once a day so far   Needs a flu shot today Asks me to refill his klonopin, does not need yet but will leave on file for him NCCSR:  05/06/2018  1   01/05/2018  Clonazepam 1 Mg Tablet  60.00 30 Je Cop  [id number]  [facility] ([id number])  0/0 4.00 LME Comm Ins  [place]  04/07/2018  1   04/07/2018  Clonazepam 1 Mg Tablet  60.00 30 Je Cop  [id number]  [facility] ([id number])  0/0 4.00 LME Comm Ins  [place]  02/10/2018  1   01/05/2018  Clonazepam 1 Mg Tablet  60.00 30 Je Cop  [id number]  [facility] ([id number])  1/3 4.00 LME Comm Ins  [place]  01/13/2018  1   01/05/2018  Clonazepam 1 Mg Tablet  60.00 30 Je Cop  [id number]  [facility] ([id number])  0/3 4.00 LME Comm Ins  [place]  12/19/2017  1   09/21/2017  Clonazepam 1 Mg Tablet  60.00 30 Je Cop  [id number]  [facility] ([id number])  3/3 4.00 LME Comm Ins  [place]  11/18/2017  1   09/21/2017  Clonazepam 1 Mg Tablet  60.00 30 Je Cop  [id number]  [facility] ([id number])  2/3        Patient Active Problem List   Diagnosis Date Noted  . Dehydration 05/12/2018  . Hyperglycemia 05/12/2018  . DKA (diabetic ketoacidoses) ([place]) 05/11/2018  . Right Achilles  tendinitis 08/15/2014  . High cholesterol   . Anxiety   . Chest pain   . SOB (shortness of breath)   . Insomnia     Past Medical History:  Diagnosis Date  . Anxiety   . Chest pain   . High cholesterol   . Insomnia   . SOB (shortness of breath)     Past Surgical History:  Procedure Laterality Date  . NO PAST SURGERIES      Social History   Tobacco Use  . Smoking status: Never Smoker  . Smokeless tobacco: Never Used  Substance Use Topics  . Alcohol use: Yes    Alcohol/week: 0.0 standard drinks    Comment: occasional  . Drug use: No    No family history on file.  Allergies  Allergen Reactions  . Eggs Or Egg-Derived Products Swelling    Lip swelling    Medication list has been reviewed and updated.  Current Outpatient Medications on File Prior  to Visit  Medication Sig Dispense Refill  . blood glucose meter kit and supplies KIT Dispense based on patient and insurance preference. Use up to four times daily as directed. (FOR ICD-9 250.00, 250.01). 1 each 0  . blood glucose meter kit and supplies Dispense based on patient and insurance preference. Use up to twice daily as directed. (FOR ICD-10 E10.9, E11.9). 1 each 0  . insulin aspart (NOVOLOG) 100 UNIT/ML FlexPen Inject 12 Units into the skin 3 (three) times daily with meals. 15 mL 0  . Insulin Detemir (LEVEMIR) 100 UNIT/ML Pen Inject 14 Units into the skin 2 (two) times daily. 15 mL 0  . potassium chloride SA (K-DUR,KLOR-CON) 20 MEQ tablet Take 1 tablet (20 mEq total) by mouth daily for 7 days. 7 tablet 0  . traZODone (DESYREL) 150 MG tablet Take 1 tablet (150 mg total) by mouth at bedtime. 30 tablet 9  . trimethoprim-polymyxin b (POLYTRIM) ophthalmic solution Place 1 drop into the right eye every 6 (six) hours for 4 days. Use for 7 days for pinkeye 10 mL 0   No current facility-administered medications on file prior to visit.     Review of Systems:  As per HPI- otherwise negative. His urinary frequency and  extreme thirst is resolved  Physical Examination: Vitals:   05/18/18 1337  BP: 130/80  Pulse: 84  Resp: 16  Temp: 98.4 F (36.9 C)  SpO2: 98%   Vitals:   05/18/18 1337  Weight: 264 lb (119.7 kg)  Height: 5' 11.5" (1.816 m)   Body mass index is 36.31 kg/m. Ideal Body Weight: Weight in (lb) to have BMI = 25: 181.4  GEN: WDWN, NAD, Non-toxic, A & O x 3, obese, conjunctivitis is resolved.  Looks well HEENT: Atraumatic, Normocephalic. Neck supple. No masses, No LAD. Ears and Nose: No external deformity. CV: RRR, No M/G/R. No JVD. No thrill. No extra heart sounds. PULM: CTA B, no wheezes, crackles, rhonchi. No retractions. No resp. distress. No accessory muscle use. ABD: S, NT, ND EXTR: No c/c/e NEURO Normal gait.  PSYCH: Normally interactive. Conversant. Not depressed or anxious appearing.  Calm demeanor.    Assessment and Plan: Uncontrolled diabetes mellitus type 2 without complications ([place]) - Plan: Comprehensive metabolic panel, Ambulatory referral to Endocrinology  GAD (generalized anxiety disorder) - Plan: clonazePAM (KLONOPIN) 1 MG tablet  Need for influenza vaccination - Plan: Flu Vaccine QUAD 6+ mos PF IM (Fluarix Quad PF)  Discussed recent diabetes dx in detail, suggested he study the ADA website for educational purposes He is using insulin as directed by inpatient team His numbers are still high, but better He is following an appropriate diet We need endocrinology support Referral placed Asked him to start checking a fasting am glucose as well as a later in the day glucose.  He is asked to record all these readings, to share with endocrinology  Invited and answered all questions  Flu shot given  Signed Perry Blinks, MD  Received his labs, message to patient Ordered RUS [place] for him  Results for orders placed or performed in visit on 05/18/18  Comprehensive metabolic panel  Result Value Ref Range   Sodium 138 135 - 145 mEq/L   Potassium 4.1 3.5 - 5.1  mEq/L   Chloride 101 96 - 112 mEq/L   CO2 29 19 - 32 mEq/L   Glucose, Bld 211 (H) 70 - 99 mg/dL   BUN 13 6 - 23 mg/dL   Creatinine, Ser 0.87 0.40 - 1.50 mg/dL  Total Bilirubin 0.4 0.2 - 1.2 mg/dL   Alkaline Phosphatase 74 39 - 117 U/L   AST 61 (H) 0 - 37 U/L   ALT 154 (H) 0 - 53 U/L   Total Protein 6.4 6.0 - 8.3 g/dL   Albumin 4.1 3.5 - 5.2 g/dL   Calcium 9.3 8.4 - 10.5 mg/dL   GFR 102.62 >60.00 mL/min

## 2018-05-14 LAB — BASIC METABOLIC PANEL
Anion gap: 14 (ref 5–15)
Anion gap: 11 (ref 5–15)
BUN: 9 mg/dL (ref 6–20)
BUN: 8 mg/dL (ref 6–20)
CALCIUM: 8.4 mg/dL — AB (ref 8.9–10.3)
CO2: 24 mmol/L (ref 22–32)
CO2: 26 mmol/L (ref 22–32)
Calcium: 8.4 mg/dL — ABNORMAL LOW (ref 8.9–10.3)
Chloride: 99 mmol/L (ref 98–111)
Chloride: 101 mmol/L (ref 98–111)
Creatinine, Ser: 0.65 mg/dL (ref 0.61–1.24)
Creatinine, Ser: 0.57 mg/dL — ABNORMAL LOW (ref 0.61–1.24)
GFR calc Af Amer: >60 mL/min (ref >60)
GFR calc Af Amer: >60 mL/min (ref >60)
GFR calc non Af Amer: >60 mL/min (ref >60)
GFR calc non Af Amer: >60 mL/min (ref >60)
Glucose, Bld: 268 mg/dL — ABNORMAL HIGH (ref 70–99)
Glucose, Bld: 248 mg/dL — ABNORMAL HIGH (ref 70–99)
Potassium: 3.3 mmol/L — ABNORMAL LOW (ref 3.5–5.1)
Potassium: 3.2 mmol/L — ABNORMAL LOW (ref 3.5–5.1)
Sodium: 137 mmol/L (ref 135–145)
Sodium: 138 mmol/L (ref 135–145)

## 2018-05-14 LAB — CBC
HCT: 40.2 % (ref 39.0–52.0)
Hemoglobin: 13.7 g/dL (ref 13.0–17.0)
MCH: 29.7 pg (ref 26.0–34.0)
MCHC: 34.1 g/dL (ref 30.0–36.0)
MCV: 87.2 fL (ref 80.0–100.0)
Platelets: 198 10*3/uL (ref 150–400)
RBC: 4.61 MIL/uL (ref 4.22–5.81)
RDW: 12.2 % (ref 11.5–15.5)
WBC: 5.2 10*3/uL (ref 4.0–10.5)
nRBC: 0 % (ref 0.0–0.2)

## 2018-05-14 LAB — GLUCOSE, CAPILLARY
Glucose-Capillary: 209 mg/dL — ABNORMAL HIGH (ref 70–99)
Glucose-Capillary: 245 mg/dL — ABNORMAL HIGH (ref 70–99)

## 2018-05-14 MED ORDER — INSULIN DETEMIR 100 UNIT/ML FLEXPEN: 14.0000 [IU] | PEN_INJECTOR | Freq: Two times a day (BID) | SUBCUTANEOUS | 0 refills | Status: DC

## 2018-05-14 MED ORDER — POTASSIUM CHLORIDE CRYS ER 20 MEQ PO TBCR: 20.0000 meq | EXTENDED_RELEASE_TABLET | Freq: Every day | ORAL | 0 refills | Status: DC

## 2018-05-14 MED ORDER — BLOOD GLUCOSE MONITOR KIT: PACK | 0 refills | Status: AC

## 2018-05-14 MED ORDER — INSULIN ASPART 100 UNIT/ML FLEXPEN: 12.0000 [IU] | PEN_INJECTOR | Freq: Three times a day (TID) | SUBCUTANEOUS | 0 refills | Status: DC

## 2018-05-14 MED ORDER — POTASSIUM CHLORIDE CRYS ER 20 MEQ PO TBCR
40.0000 meq | EXTENDED_RELEASE_TABLET | Freq: Two times a day (BID) | ORAL | Status: DC
  Administered 2018-05-14: 40 meq via ORAL
  Filled 2018-05-14: qty 2

## 2018-05-14 MED ORDER — POLYMYXIN B-TRIMETHOPRIM 10000-0.1 UNIT/ML-% OP SOLN: 1.0000 [drp] | Freq: Four times a day (QID) | OPHTHALMIC | 0 refills | Status: AC

## 2018-05-14 NOTE — Care Management Note (Signed)
Case Management Note  Patient Details  Name: Idrees Orlandini MRN: [id number] Date of Birth: 30-Dec-1985  Subjective/Objective:  DM                  Action/Plan: NCM spoke to pt and parents at bedside. Faxed pt's Rx to his pharmacy to check copay price. Pt reports his coverage is with his job. Provided pt with contact number for [facility] to call and arrange follow up on DM teaching. Faxed Levemir copay card to [employer]. Will check copay cost for meds.   Expected Discharge Date:  05/14/18               Expected Discharge Plan:  Home/Self Care  In-House Referral:  NA  Discharge planning Services  CM Consult, Medication Assistance  Post Acute Care Choice:  NA Choice offered to:  NA  DME Arranged:  N/A DME Agency:  NA  HH Arranged:  NA HH Agency:  NA  Status of Service:  Completed, signed off  If discussed at Long Length of Stay Meetings, dates discussed:    Additional Comments:  Elliot Cousin, RN 05/14/2018, 1:53 PM

## 2018-05-14 NOTE — Discharge Instructions (Signed)
Bacterial Conjunctivitis, Adult Bacterial conjunctivitis is an infection of your conjunctiva. This is the clear membrane that covers the white part of your eye and the inner part of your eyelid. This infection can make your eye:  Red or pink.  Itchy. This condition spreads easily from person to person (is contagious) and from one eye to the other eye. What are the causes?  This condition is caused by germs (bacteria). You may get the infection if you come into close contact with: ? A person who has the infection. ? Items that have germs on them (are contaminated), such as face towels, contact lens solution, or eye makeup. What increases the risk? You are more likely to get this condition if you:  Have contact with people who have the infection.  Wear contact lenses.  Have a sinus infection.  Have had a recent eye injury or surgery.  Have a weak body defense system (immune system).  Have dry eyes. What are the signs or symptoms?   Thick, yellowish discharge from the eye.  Tearing or watery eyes.  Itchy eyes.  Burning feeling in your eyes.  Eye redness.  Swollen eyelids.  Blurred vision. How is this treated?   Antibiotic eye drops or ointment.  Antibiotic medicine taken by mouth. This is used for infections that do not get better with drops or ointment or that last more than 10 days.  Cool, wet cloths placed on the eyes.  Artificial tears used 2-6 times a day. Follow these instructions at home: Medicines  Take or apply your antibiotic medicine as told by your doctor. Do not stop taking or applying the antibiotic even if you start to feel better.  Take or apply over-the-counter and prescription medicines only as told by your doctor.  Do not touch your eyelid with the eye-drop bottle or the ointment tube. Managing discomfort  Wipe any fluid from your eye with a warm, wet washcloth or a cotton ball.  Place a clean, cool, wet cloth on your eye. Do this for  10-20 minutes, 3-4 times per day. General instructions  Do not wear contacts until the infection is gone. Wear glasses until your doctor says it is okay to wear contacts again.  Do not wear eye makeup until the infection is gone. Throw away old eye makeup.  Change or wash your pillowcase every day.  Do not share towels or washcloths.  Wash your hands often with soap and water. Use paper towels to dry your hands.  Do not touch or rub your eyes.  Do not drive or use heavy machinery if your vision is blurred. Contact a doctor if:  You have a fever.  You do not get better after 10 days. Get help right away if:  You have a fever and your symptoms get worse all of a sudden.  You have very bad pain when you move your eye.  Your face: ? Hurts. ? Is red. ? Is swollen.  You have sudden loss of vision. Summary  Bacterial conjunctivitis is an infection of your conjunctiva.  This infection spreads easily from person to person.  Wash your hands often with soap and water. Use paper towels to dry your hands.  Take or apply your antibiotic medicine as told by your doctor.  Contact a doctor if you have a fever or you do not get better after 10 days. This information is not intended to replace advice given to you by your health care provider. Make sure you discuss any  questions you have with your health care provider. Document Released: 01/27/2008 Document Revised: 11/23/2017 Document Reviewed: 11/23/2017 Elsevier Interactive Patient Education  2019 [place].   Blood Glucose Monitoring, Adult Monitoring your blood sugar (glucose) is an important part of managing your diabetes (diabetes mellitus). Blood glucose monitoring involves checking your blood glucose as often as directed and keeping a record (log) of your results over time. Checking your blood glucose regularly and keeping a blood glucose log can:  Help you and your health care provider adjust your diabetes management  plan as needed, including your medicines or insulin.  Help you understand how food, exercise, illnesses, and medicines affect your blood glucose.  Let you know what your blood glucose is at any time. You can quickly find out if you have low blood glucose (hypoglycemia) or high blood glucose (hyperglycemia). Your health care provider will set individualized treatment goals for you. Your goals will be based on your age, other medical conditions you have, and how you respond to diabetes treatment. Generally, the goal of treatment is to maintain the following blood glucose levels:  Before meals (preprandial): 80-130 mg/dL (4.4-7.2 mmol/L).  After meals (postprandial): below 180 mg/dL (10 mmol/L).  A1c level: less than 7%. Supplies needed:  Blood glucose meter.  Test strips for your meter. Each meter has its own strips. You must use the strips that came with your meter.  A needle to prick your finger (lancet). Do not use a lancet more than one time.  A device that holds the lancet (lancing device).  A journal or log book to write down your results. How to check your blood glucose  1. Wash your hands with soap and water. 2. Prick the side of your finger (not the tip) with the lancet. Use a different finger each time. 3. Gently rub the finger until a small drop of blood appears. 4. Follow instructions that come with your meter for inserting the test strip, applying blood to the strip, and using your blood glucose meter. 5. Write down your result and any notes. Some meters allow you to use areas of your body other than your finger (alternative sites) to test your blood. The most common alternative sites are:  Forearm.  Thigh.  Palm of the hand. If you think you may have hypoglycemia, or if you have a history of not knowing when your blood glucose is getting low (hypoglycemia unawareness), do not use alternative sites. Use your finger instead. Alternative sites may not be as accurate as  the fingers, because blood flow is slower in these areas. This means that the result you get may be delayed, and it may be different from the result that you would get from your finger. Follow these instructions at home: Blood glucose log   Every time you check your blood glucose, write down your result. Also write down any notes about things that may be affecting your blood glucose, such as your diet and exercise for the day. This information can help you and your health care provider: ? Look for patterns in your blood glucose over time. ? Adjust your diabetes management plan as needed.  Check if your meter allows you to download your records to a computer. Most glucose meters store a record of glucose readings in the meter. If you have type 1 diabetes:  Check your blood glucose 2 or more times a day.  Also check your blood glucose: ? Before every insulin injection. ? Before and after exercise. ? Before  meals. ? 2 hours after a meal. ? Occasionally between 2:00 a.m. and 3:00 a.m., as directed. ? Before potentially dangerous tasks, like driving or using heavy machinery. ? At bedtime.  You may need to check your blood glucose more often, up to 6-10 times a day, if you: ? Use an insulin pump. ? Need multiple daily injections (MDI). ? Have diabetes that is not well-controlled. ? Are ill. ? Have a history of severe hypoglycemia. ? Have hypoglycemia unawareness. If you have type 2 diabetes:  If you take insulin or other diabetes medicines, check your blood glucose 2 or more times a day.  If you are on intensive insulin therapy, check your blood glucose 4 or more times a day. Occasionally, you may also need to check between 2:00 a.m. and 3:00 a.m., as directed.  Also check your blood glucose: ? Before and after exercise. ? Before potentially dangerous tasks, like driving or using heavy machinery.  You may need to check your blood glucose more often if: ? Your medicine is being  adjusted. ? Your diabetes is not well-controlled. ? You are ill. General tips  Always keep your supplies with you.  If you have questions or need help, all blood glucose meters have a 24-hour "hotline" phone number that you can call. You may also contact your health care provider.  After you use a few boxes of test strips, adjust (calibrate) your blood glucose meter by following instructions that came with your meter. Contact a health care provider if:  Your blood glucose is at or above 240 mg/dL (13.3 mmol/L) for 2 days in a row.  You have been sick or have had a fever for 2 days or longer, and you are not getting better.  You have any of the following problems for more than 6 hours: ? You cannot eat or drink. ? You have nausea or vomiting. ? You have diarrhea. Get help right away if:  Your blood glucose is lower than 54 mg/dL (3 mmol/L).  You become confused or you have trouble thinking clearly.  You have difficulty breathing.  You have moderate or large ketone levels in your urine. Summary  Monitoring your blood sugar (glucose) is an important part of managing your diabetes (diabetes mellitus).  Blood glucose monitoring involves checking your blood glucose as often as directed and keeping a record (log) of your results over time.  Your health care provider will set individualized treatment goals for you. Your goals will be based on your age, other medical conditions you have, and how you respond to diabetes treatment.  Every time you check your blood glucose, write down your result. Also write down any notes about things that may be affecting your blood glucose, such as your diet and exercise for the day. This information is not intended to replace advice given to you by your health care provider. Make sure you discuss any questions you have with your health care provider. Document Released: 04/22/2003 Document Revised: 02/28/2017 Document Reviewed: 09/29/2015 Elsevier  Interactive Patient Education  2019 [place].   Correction Insulin  Correction insulin, also called corrective insulin or a supplemental dose, is a small amount of insulin that can be used to lower your blood sugar (glucose) if it is too high. You may be instructed to check your blood glucose at certain times of the day and to use correction insulin as needed to lower your blood glucose to your target range. Correction insulin is primarily used as part of diabetes  management. It may also be prescribed for people who do not have diabetes. What is a correction scale? A correction scale, also called a sliding scale, is prescribed by your health care provider to help you determine when you need correction insulin. Your correction scale is based on your individual treatment goals, and it has two parts:  Ranges of blood glucose levels.  How much correction insulin to give yourself if your blood sugar falls within a certain range. If your blood glucose is in your desired range, you will not need correction insulin and you should take your normal insulin dose. What type of insulin do I need? Your health care provider may prescribe rapid-acting or short-acting insulin for you to use as correction insulin. Rapid-acting insulin:  Starts working quickly, in as little as 5 minutes.  Can last for 3-6 hours.  Works well when taken right before a meal to quickly lower blood glucose. Short-acting insulin:  Starts working in about 30 minutes.  Can last for 6-8 hours.  Should be taken about 30 minutes before you start eating a meal. Talk with your health care provider or pharmacist about which type of correction insulin to take and when to take it. If you use insulin to control your diabetes, you should use correction insulin in addition to the longer-acting (basal) insulin that you normally use. How do I manage my blood glucose with correction insulin? Giving a correction dose  Check your blood  glucose as directed by your health care provider.  Use your correction scale to find the range that your blood glucose is in.  Identify the units of insulin that match your blood glucose range.  Make sure you have food available that you can eat in the next 15-30 minutes, after your correction dose.  Give yourself the dose of correction insulin that your health care provider has prescribed in your correction scale. Always make sure you are using the right type of insulin. ? If your correction insulin is rapid-acting, start eating a meal within 15 minutes after your correction dose to keep your blood glucose from getting too low. ? If your correction insulin is short-acting, start eating a meal within 30 minutes after your correction dose to keep your blood glucose from getting too low. Keeping a blood glucose log   Write down your blood glucose test results and the amount of insulin that you give yourself. Do this every time you check blood glucose or take insulin. Bring this log with you to your medical visits. This information will help your health care provider to manage your medicines.  Note anything that may affect your blood glucose, such as: ? Changes in normal exercise or activity. ? Changes in your normal schedule, such as changes in your sleep routine, going on vacation, changing your diet, or holidays. ? New over-the-counter or prescription medicines. ? Illness, stress, or anxiety. ? Changes in the time that you took your medicine or insulin. ? Changes in your meals, such as skipping a meal, having a late meal, or dining out. ? Eating things that may affect blood glucose, such as snacks, meal portions that are larger than normal, drinks that contain sugar, or eating less than usual. What do I need to know about hyperglycemia and hypoglycemia? What is hyperglycemia? Hyperglycemia, also called high blood glucose, occurs when blood glucose is too high. Make sure you know the early  signs of hyperglycemia, such as:  Increased thirst.  Hunger.  Feeling very tired.  Needing  to urinate more often than usual.  Blurry vision. What is hypoglycemia? Hypoglycemia is also called low blood glucose. Be aware of stacking your insulin doses. This happens when you correct a high blood glucose by giving yourself extra insulin too soon after a previous correction dose or mealtime dose. This may cause you to have too much insulin in your body and may put you at risk for hypoglycemia. Hypoglycemia occurs with a blood glucose level at or below 70 mg/dL (3.9 mmol/L). It is important to know the symptoms of hypoglycemia and treat it right away. Always have a 15-gram rapid-acting carbohydrate snack with you to treat low blood glucose. Family members and close friends should also know the symptoms and should understand how to treat hypoglycemia, in case you are not able to treat yourself. What are the symptoms of hypoglycemia? Hypoglycemia symptoms can include:  Hunger.  Anxiety.  Sweating and feeling clammy.  Confusion.  Dizziness or light-headedness.  Sleepiness.  Nausea.  Increased heart rate.  Headache.  Blurry vision.  Jerky movements that you cannot control (seizure).  Nightmares.  Tingling or numbness around the mouth, lips, or tongue.  A change in speech.  Decreased ability to concentrate.  A change in coordination.  Restless sleep.  Tremors or shakes.  Fainting.  Irritability. How do I treat hypoglycemia? If you are alert and able to swallow safely, follow the 15:15 rule:  Take 15 grams of a rapid-acting carbohydrate. Rapid-acting options include: ? 1 tube of glucose gel. ? 3 glucose pills. ? 6-8 pieces of hard candy. ? 4 oz (120 mL) of fruit juice. ? 4 oz (120 mL) of regular (not diet) soda.  Check your blood glucose 15 minutes after you take the carbohydrate. ? If the repeat blood glucose level is still at or below 70 mg/dL (3.9 mmol/L),  take 15 grams of a carbohydrate again. ? If your blood glucose level does not increase above 70 mg/dL (3.9 mmol/L) after 3 tries, seek emergency medical care.  After your blood glucose level returns to normal, eat a meal or a snack within 1 hour.  How do I treat severe hypoglycemia? Severe hypoglycemia is when your blood glucose level is at or below 54 mg/dL (3 mmol/L). Severe hypoglycemia is an emergency. Do not wait to see if the symptoms will go away. Get medical help right away. Call your local emergency services (911 in the [place] Do not drive yourself to the hospital. If you have severe hypoglycemia and you cannot eat or drink, you may need an injection of glucagon. A family member or close friend should learn how to check your blood glucose and how to give you a glucagon injection. Ask your health care provider if you need to have an emergency glucagon injection kit available. Severe hypoglycemia may need to be treated in a hospital. The treatment may include getting glucose through an IV tube. You may also need treatment for the cause of your hypoglycemia. Why do I need correction insulin if I do not have diabetes? If you do not have diabetes, your health care provider may prescribe insulin because:  Keeping your blood glucose in the target range is important for your overall health.  You are taking medicines that cause your blood glucose to be higher than normal. Contact a health care provider if:  You develop low blood glucose that you are not able to treat yourself.  You have high blood glucose that you are not able to correct with correction insulin.  Your blood glucose is often too low.  You used emergency glucagon to treat low blood glucose. Get help right away if:  You become unresponsive. If this happens, someone else should call emergency services (911 in the [place] right away.  Your blood glucose is lower than 54 mg/dL (3.0 mmol/L).  You become confused or you have  trouble thinking clearly.  You have difficulty breathing. Summary  Correction insulin is a small amount of insulin that can be used to lower your blood sugar (glucose) if it is too high.  Talk with your health care provider or pharmacist about which type of correction insulin to take and when to take it. If you use insulin to control your diabetes, you should use correction insulin in addition to the longer-acting (basal) insulin that you normally use.  You may be instructed to check your blood glucose at certain times of the day and to use correction insulin as needed to lower your blood glucose to your target range. Always keep a log of your blood glucose values and the amount of insulin that you used.  It is important to know the symptoms of hypoglycemia and treat it right away. Always have a 15-gram rapid-acting carbohydrate snack with you to treat low blood glucose. Family members and close friends should also know the symptoms and should understand how to treat hypoglycemia, in case you are not able to treat yourself. This information is not intended to replace advice given to you by your health care provider. Make sure you discuss any questions you have with your health care provider. Document Released: 09/10/2010 Document Revised: 01/16/2016 Document Reviewed: 01/16/2016 Elsevier Interactive Patient Education  2019 [place].   Complementary and Alternative Medical Therapies for Diabetes Complementary and alternative medical therapies are treatments that are different from typical medical treatments (Western treatments). "Complementary" means that the therapy is used with Western treatments. "Alternative" means that the therapy is used instead of Western treatments. Are these therapies safe? Some of these therapies are usually safe. Others may be harmful. Often, there is not enough research to show how safe or effective a therapy is. If you want to try a complementary or alternative  therapy, talk with your health care provider to make sure it is safe. What alternative or complementary therapies are used to treat diabetes? Acupuncture  Acupuncture is the insertion of needles into certain places on the skin. This is done by a professional. It is often used to relieve long-term (chronic) pain, especially of the bones and joints. It may help you if you have painful nerve damage. Biofeedback Biofeedback helps you to become more aware of your body's response to pain. It also helps you to learn ways of dealing with pain. Biofeedback is about relaxing and reducing stress. An example of a biofeedback technique is guided imagery. This involves creating peaceful images in your mind. Chromium Chromium is a substance that can help improve how insulin works in the body. Chromium is in many foods, including whole grains, nuts, and egg yolks. Chromium may also be taken as a supplement. Taking chromium supplements may help to control diabetes, especially if you have a lack of chromium (deficiency) in your body. However, research has not proven this. If you have kidney problems, you should be careful with chromium supplements. American ginseng American ginseng is an herb that may lower glucose levels. It may also help lower A1C levels. More research is needed before recommendations for ginseng use can be made. Magnesium Magnesium is a  mineral found in many foods, such as whole grains, nuts, and green leafy vegetables. Having low magnesium levels may make controlling blood glucose more difficult for people who have type 2 diabetes. Low magnesium levels may also contribute to certain diabetes complications. Getting more magnesium and eating a high-fiber diet may reduce the risk of developing type 2 diabetes. Vanadium Vanadium is a compound found in small amounts in certain plants and animals. Some studies show that it improves glucose levels in animals with diabetes. In one study, people with  diabetes were able to lower their insulin dosage when taking vanadium. More research about side effects and safe dosage levels is needed. Cinnamon Cinnamon may decrease insulin resistance, increase insulin production, and lower blood glucose levels. It may work best when used with diabetes medicines. Fenugreek Fenugreek is an herb whose seeds are often used in cooking. It may help lower blood glucose by decreasing carbohydrate absorption and increasing insulin production. Summary  Talk with your health care provider about complementary or alternative therapy for you. Some therapies may be appropriate for you, but others may cause side effects.  Follow your diabetes care plan as prescribed. This information is not intended to replace advice given to you by your health care provider. Make sure you discuss any questions you have with your health care provider. Document Released: 02/14/2007 Document Revised: 05/05/2016 Document Reviewed: 05/05/2016 Elsevier Interactive Patient Education  2019 [place].   Diabetes Mellitus and Exercise Exercising regularly is important for your overall health, especially when you have diabetes (diabetes mellitus). Exercising is not only about losing weight. It has many other health benefits, such as increasing muscle strength and bone density and reducing body fat and stress. This leads to improved fitness, flexibility, and endurance, all of which result in better overall health. Exercise has additional benefits for people with diabetes, including:  Reducing appetite.  Helping to lower and control blood glucose.  Lowering blood pressure.  Helping to control amounts of fatty substances (lipids) in the blood, such as cholesterol and triglycerides.  Helping the body to respond better to insulin (improving insulin sensitivity).  Reducing how much insulin the body needs.  Decreasing the risk for heart disease by: ? Lowering cholesterol and triglyceride  levels. ? Increasing the levels of good cholesterol. ? Lowering blood glucose levels. What is my activity plan? Your health care provider or certified diabetes educator can help you make a plan for the type and frequency of exercise (activity plan) that works for you. Make sure that you:  Do at least 150 minutes of moderate-intensity or vigorous-intensity exercise each week. This could be brisk walking, biking, or water aerobics. ? Do stretching and strength exercises, such as yoga or weightlifting, at least 2 times a week. ? Spread out your activity over at least 3 days of the week.  Get some form of physical activity every day. ? Do not go more than 2 days in a row without some kind of physical activity. ? Avoid being inactive for more than 30 minutes at a time. Take frequent breaks to walk or stretch.  Choose a type of exercise or activity that you enjoy, and set realistic goals.  Start slowly, and gradually increase the intensity of your exercise over time. What do I need to know about managing my diabetes?   Check your blood glucose before and after exercising. ? If your blood glucose is 240 mg/dL (13.3 mmol/L) or higher before you exercise, check your urine for ketones. If you have  ketones in your urine, do not exercise until your blood glucose returns to normal. ? If your blood glucose is 100 mg/dL (5.6 mmol/L) or lower, eat a snack containing 15-20 grams of carbohydrate. Check your blood glucose 15 minutes after the snack to make sure that your level is above 100 mg/dL (5.6 mmol/L) before you start your exercise.  Know the symptoms of low blood glucose (hypoglycemia) and how to treat it. Your risk for hypoglycemia increases during and after exercise. Common symptoms of hypoglycemia can include: ? Hunger. ? Anxiety. ? Sweating and feeling clammy. ? Confusion. ? Dizziness or feeling light-headed. ? Increased heart rate or palpitations. ? Blurry vision. ? Tingling or numbness  around the mouth, lips, or tongue. ? Tremors or shakes. ? Irritability.  Keep a rapid-acting carbohydrate snack available before, during, and after exercise to help prevent or treat hypoglycemia.  Avoid injecting insulin into areas of the body that are going to be exercised. For example, avoid injecting insulin into: ? The arms, when playing tennis. ? The legs, when jogging.  Keep records of your exercise habits. Doing this can help you and your health care provider adjust your diabetes management plan as needed. Write down: ? Food that you eat before and after you exercise. ? Blood glucose levels before and after you exercise. ? The type and amount of exercise you have done. ? When your insulin is expected to peak, if you use insulin. Avoid exercising at times when your insulin is peaking.  When you start a new exercise or activity, work with your health care provider to make sure the activity is safe for you, and to adjust your insulin, medicines, or food intake as needed.  Drink plenty of water while you exercise to prevent dehydration or heat stroke. Drink enough fluid to keep your urine clear or pale yellow. Summary  Exercising regularly is important for your overall health, especially when you have diabetes (diabetes mellitus).  Exercising has many health benefits, such as increasing muscle strength and bone density and reducing body fat and stress.  Your health care provider or certified diabetes educator can help you make a plan for the type and frequency of exercise (activity plan) that works for you.  When you start a new exercise or activity, work with your health care provider to make sure the activity is safe for you, and to adjust your insulin, medicines, or food intake as needed. This information is not intended to replace advice given to you by your health care provider. Make sure you discuss any questions you have with your health care provider. Document Released:  07/10/2003 Document Revised: 10/28/2016 Document Reviewed: 09/29/2015 Elsevier Interactive Patient Education  2019 [employer]   Diabetes Mellitus and [place] care is an important part of your health, especially when you have diabetes. Diabetes may cause you to have problems because of poor blood flow (circulation) to your feet and legs, which can cause your skin to:  Become thinner and drier.  Break more easily.  Heal more slowly.  Peel and crack. You may also have nerve damage (neuropathy) in your legs and feet, causing decreased feeling in them. This means that you may not notice minor injuries to your feet that could lead to more serious problems. Noticing and addressing any potential problems early is the best way to prevent future foot problems. How to care for your feet Foot hygiene  Wash your feet daily with warm water and mild soap. Do not use  hot water. Then, pat your feet and the areas between your toes until they are completely dry. Do not soak your feet as this can dry your skin.  Trim your toenails straight across. Do not dig under them or around the cuticle. File the edges of your nails with an emery board or nail file.  Apply a moisturizing lotion or petroleum jelly to the skin on your feet and to dry, brittle toenails. Use lotion that does not contain alcohol and is unscented. Do not apply lotion between your toes. Shoes and socks  Wear clean socks or stockings every day. Make sure they are not too tight. Do not wear knee-high stockings since they may decrease blood flow to your legs.  Wear shoes that fit properly and have enough cushioning. Always look in your shoes before you put them on to be sure there are no objects inside.  To break in new shoes, wear them for just a few hours a day. This prevents injuries on your feet. Wounds, scrapes, corns, and calluses  Check your feet daily for blisters, cuts, bruises, sores, and redness. If you cannot see the  bottom of your feet, use a mirror or ask someone for help.  Do not cut corns or calluses or try to remove them with medicine.  If you find a minor scrape, cut, or break in the skin on your feet, keep it and the skin around it clean and dry. You may clean these areas with mild soap and water. Do not clean the area with peroxide, alcohol, or iodine.  If you have a wound, scrape, corn, or callus on your foot, look at it several times a day to make sure it is healing and not infected. Check for: ? Redness, swelling, or pain. ? Fluid or blood. ? Warmth. ? Pus or a bad smell. General instructions  Do not cross your legs. This may decrease blood flow to your feet.  Do not use heating pads or hot water bottles on your feet. They may burn your skin. If you have lost feeling in your feet or legs, you may not know this is happening until it is too late.  Protect your feet from hot and cold by wearing shoes, such as at the beach or on hot pavement.  Schedule a complete foot exam at least once a year (annually) or more often if you have foot problems. If you have foot problems, report any cuts, sores, or bruises to your health care provider immediately. Contact a health care provider if:  You have a medical condition that increases your risk of infection and you have any cuts, sores, or bruises on your feet.  You have an injury that is not healing.  You have redness on your legs or feet.  You feel burning or tingling in your legs or feet.  You have pain or cramps in your legs and feet.  Your legs or feet are numb.  Your feet always feel cold.  You have pain around a toenail. Get help right away if:  You have a wound, scrape, corn, or callus on your foot and: ? You have pain, swelling, or redness that gets worse. ? You have fluid or blood coming from the wound, scrape, corn, or callus. ? Your wound, scrape, corn, or callus feels warm to the touch. ? You have pus or a bad smell coming  from the wound, scrape, corn, or callus. ? You have a fever. ? You have a red line  going up your leg. Summary  Check your feet every day for cuts, sores, red spots, swelling, and blisters.  Moisturize feet and legs daily.  Wear shoes that fit properly and have enough cushioning.  If you have foot problems, report any cuts, sores, or bruises to your health care provider immediately.  Schedule a complete foot exam at least once a year (annually) or more often if you have foot problems. This information is not intended to replace advice given to you by your health care provider. Make sure you discuss any questions you have with your health care provider. Document Released: 04/16/2000 Document Revised: 06/01/2017 Document Reviewed: 05/21/2016 Elsevier Interactive Patient Education  2019 [place].   Diabetes Mellitus and Sick Day Management Blood sugar (glucose) can be difficult to control when you are sick. Common illnesses that can cause problems for people with diabetes (diabetes mellitus) include colds, fever, flu (influenza), nausea, vomiting, and diarrhea. These illnesses can cause stress and loss of body fluids (dehydration), and those issues can cause blood glucose levels to increase. Because of this, it is very important to take your insulin and diabetes medicines and eat some form of carbohydrate when you are sick. You should make a plan for days when you are sick (sick day plan) as part of your diabetes management plan. You and your health care provider should make this plan in advance. The following guidelines are intended to help you manage an illness that lasts for about 24 hours or less. Your health care provider may also give you more specific instructions. What do I need to do to manage my blood glucose?   Check your blood glucose every 2-4 hours, or as often as told by your health care provider.  Know your sick day treatment goals. Your target blood glucose levels may be  different when you are sick.  If you use insulin, take your usual dose. ? If your blood glucose continues to be too high, you may need to take an additional insulin dose as told by your health care provider.  If you use oral diabetes medicine, you may need to stop taking it if you are not able to eat or drink normally. Ask your health care provider about whether you need to stop taking these medicines while you are sick.  If you use injectable hormone medicines other than insulin to control your diabetes, ask your health care provider about whether you need to stop taking these medicines while you are sick. What else can I do to manage my diabetes when I am sick? Check your ketones  If you have type 1 diabetes, check your urine ketones every 4 hours.  If you have type 2 diabetes, check your urine ketones as often as told by your health care provider. Drink fluids  Drink enough fluid to keep your urine clear or pale yellow. This is especially important if you have a fever, vomiting, or diarrhea. Those symptoms can lead to dehydration.  Follow any instructions from your health care provider about beverages to avoid. ? Do not drink alcohol, caffeine, or drinks that contain a lot of sugar. Take medicines as directed  Take-over-the-counter and prescription medicines only as told by your health care provider.  Check medicine labels for added sugars. Some medicines may contain sugar or types of sugars that can raise your blood glucose level. What foods can I eat when I am sick?  You need to eat some form of carbohydrates when you are sick. You should  eat 45-50 grams (45-50 g) of carbohydrates every 3-4 hours until you feel better. All of the food choices below contain about 15 g of carbohydrates. Plan ahead and keep some of these foods around so you have them if you get sick.  4-6 oz (120-177 mL) carbonated beverage that contains sugar, such as regular (not diet) soda. You may be able to drink  carbonated beverages more easily if you open the beverage and let it sit at room temperature for a few minutes before drinking.   of a twin frozen ice pop.  4 oz (120 g) regular gelatin.  4 oz (120 mL) fruit juice.  4 oz (120 g) ice cream or frozen yogurt.  2 oz (60 g) sherbet.  8 oz (240 mL) clear broth or soup.  4 oz (120 g) regular custard.  4 oz (120 g) regular pudding.  8 oz (240 g) plain yogurt.  1 slice bread or toast.  6 saltine crackers.  5 vanilla wafers. Questions to ask your health care provider Consider asking the following questions so you know what to do on days when you are sick:  Should I adjust my diabetes medicines?  How often do I need to check my blood glucose?  What supplies do I need to manage my diabetes at home when I am sick?  What number can I call if I have questions?  What foods and drinks should I avoid? Contact a health care provider if:  You develop symptoms of diabetic ketoacidosis, such as: ? Fatigue. ? Weight loss. ? Excessive thirst. ? Light-headedness. ? Fruity or sweet-smelling breath. ? Excessive urination. ? Vision changes. ? Confusion or irritability. ? Nausea. ? Vomiting. ? Rapid breathing. ? Pain in the abdomen. ? Feeling flushed.  You are unable to drink fluids without vomiting.  You have any of the following for more than 6 hours: ? Nausea. ? Vomiting. ? Diarrhea.  Your blood glucose is at or above 240 mg/dL (13.3 mmol/L), even after you take an additional insulin dose.  You have a change in how you think, feel, or act (mental status).  You develop another serious illness.  You have been sick or have had a fever for 2 days or longer and you are not getting better. Get help right away if:  Your blood glucose is lower than 54 mg/dL (3.0 mmol/L).  You have difficulty breathing.  You have moderate or high ketone levels in your urine.  You used emergency glucagon to treat low blood  glucose. Summary  Blood sugar (glucose) can be difficult to control when you are sick. Common illnesses that can cause problems for people with diabetes (diabetes mellitus) include colds, fever, flu (influenza), nausea, vomiting, and diarrhea.  Illnesses can cause stress and loss of body fluids (dehydration), and those issues can cause blood glucose levels to increase.  Make a plan for days when you are sick (sick day plan) as part of your diabetes management plan. You and your health care provider should make this plan in advance.  It is very important to take your insulin and diabetes medicines and to eat some form of carbohydrate when you are sick.  Contact your health care provider if have problems managing your blood glucose levels when you are sick, or if you have been sick or had a fever for 2 days or longer and are not getting better. This information is not intended to replace advice given to you by your health care provider. Make sure you  discuss any questions you have with your health care provider. Document Released: 04/22/2003 Document Revised: 01/16/2016 Document Reviewed: 01/16/2016 Elsevier Interactive Patient Education  2019 [place].  Daily Diabetes Record Introduction Check your blood glucose (BG) as directed by your health care provider. Use this form to record your BG results as well as any diabetes medicines that you take, including insulin. Bringing a record of your BG results and a list of your current medicines to your health care provider is very helpful in managing your diabetes. These numbers help your health care provider to know whether your diabetes management plan needs to be changed. Patient name: ____________________________________ Week of ____________________ Daily BG results and diabetes medicines Date: _________  Breakfast - BG / Medicines: ________________ / __________________________________________________________  Lunch - BG / Medicines:  ___________________ / __________________________________________________________  Dinner - BG / Medicines: __________________ / __________________________________________________________  Bedtime - BG / Medicines: ________________ / ___________________________________________________________ Date: _________  Breakfast - BG / Medicines: ________________ / __________________________________________________________  Lunch - BG / Medicines: ___________________ / __________________________________________________________  Dinner - BG / Medicines: __________________ / __________________________________________________________  Bedtime - BG / Medicines: ________________ / ___________________________________________________________ Date: _________  Breakfast - BG / Medicines: ________________ / __________________________________________________________  Lunch - BG / Medicines: ___________________ / __________________________________________________________  Dinner - BG / Medicines: __________________ / __________________________________________________________  Bedtime - BG / Medicines: ________________ / ___________________________________________________________ Date: _________  Breakfast - BG / Medicines: ________________ / __________________________________________________________  Lunch - BG / Medicines: ___________________ / __________________________________________________________  Dinner - BG / Medicines: __________________ / __________________________________________________________  Bedtime - BG / Medicines: ________________ / ___________________________________________________________ Date: _________  Breakfast - BG / Medicines: ________________ / __________________________________________________________  Lunch - BG / Medicines: ___________________ / __________________________________________________________  Dinner - BG / Medicines: __________________ /  __________________________________________________________  Bedtime - BG / Medicines: ________________ / ___________________________________________________________ Date: _________  Breakfast - BG / Medicines: ________________ / __________________________________________________________  Lunch - BG / Medicines: ___________________ / __________________________________________________________  Dinner - BG / Medicines: __________________ / __________________________________________________________  Bedtime - BG / Medicines: ________________ / ___________________________________________________________ Date: _________  Breakfast - BG / Medicines: ________________ / __________________________________________________________  Lunch - BG / Medicines: ___________________ / __________________________________________________________  Dinner - BG / Medicines: __________________ / __________________________________________________________  Bedtime - BG / Medicines: ________________ / ___________________________________________________________ Notes: ______________________________________________________________________________________________________________________ This information is not intended to replace advice given to you by your health care provider. Make sure you discuss any questions you have with your health care provider. Document Released: 03/23/2004 Document Revised: 01/16/2016 Document Reviewed: 01/16/2016 Elsevier Interactive Patient Education  2019 [employer].   Diabetes Mellitus and Nutrition, Adult When you have diabetes (diabetes mellitus), it is very important to have healthy eating habits because your blood sugar (glucose) levels are greatly affected by what you eat and drink. Eating healthy foods in the appropriate amounts, at about the same times every day, can help you:  Control your blood glucose.  Lower your risk of heart disease.  Improve your blood  pressure.  Reach or maintain a healthy weight. Every person with diabetes is different, and each person has different needs for a meal plan. Your health care provider may recommend that you work with a diet and nutrition specialist (dietitian) to make a meal plan that is best for you. Your meal plan may vary depending on factors such as:  The calories you need.  The medicines you take.  Your weight.  Your blood glucose, blood pressure, and cholesterol levels.  Your activity level.  Other health conditions you have, such as  heart or kidney disease. How do carbohydrates affect me? Carbohydrates, also called carbs, affect your blood glucose level more than any other type of food. Eating carbs naturally raises the amount of glucose in your blood. Carb counting is a method for keeping track of how many carbs you eat. Counting carbs is important to keep your blood glucose at a healthy level, especially if you use insulin or take certain oral diabetes medicines. It is important to know how many carbs you can safely have in each meal. This is different for every person. Your dietitian can help you calculate how many carbs you should have at each meal and for each snack. Foods that contain carbs include:  Bread, cereal, rice, pasta, and crackers.  Potatoes and corn.  Peas, beans, and lentils.  Milk and yogurt.  Fruit and juice.  Desserts, such as cakes, cookies, ice cream, and candy. How does alcohol affect me? Alcohol can cause a sudden decrease in blood glucose (hypoglycemia), especially if you use insulin or take certain oral diabetes medicines. Hypoglycemia can be a life-threatening condition. Symptoms of hypoglycemia (sleepiness, dizziness, and confusion) are similar to symptoms of having too much alcohol. If your health care provider says that alcohol is safe for you, follow these guidelines:  Limit alcohol intake to no more than 1 drink per day for nonpregnant women and 2 drinks per  day for men. One drink equals 12 oz of beer, 5 oz of wine, or 1 oz of hard liquor.  Do not drink on an empty stomach.  Keep yourself hydrated with water, diet soda, or unsweetened iced tea.  Keep in mind that regular soda, juice, and other mixers may contain a lot of sugar and must be counted as carbs. What are tips for following this plan?  Reading food labels  Start by checking the serving size on the "Nutrition Facts" label of packaged foods and drinks. The amount of calories, carbs, fats, and other nutrients listed on the label is based on one serving of the item. Many items contain more than one serving per package.  Check the total grams (g) of carbs in one serving. You can calculate the number of servings of carbs in one serving by dividing the total carbs by 15. For example, if a food has 30 g of total carbs, it would be equal to 2 servings of carbs.  Check the number of grams (g) of saturated and trans fats in one serving. Choose foods that have low or no amount of these fats.  Check the number of milligrams (mg) of salt (sodium) in one serving. Most people should limit total sodium intake to less than 2,300 mg per day.  Always check the nutrition information of foods labeled as "low-fat" or "nonfat". These foods may be higher in added sugar or refined carbs and should be avoided.  Talk to your dietitian to identify your daily goals for nutrients listed on the label. Shopping  Avoid buying canned, premade, or processed foods. These foods tend to be high in fat, sodium, and added sugar.  Shop around the outside edge of the grocery store. This includes fresh fruits and vegetables, bulk grains, fresh meats, and fresh dairy. Cooking  Use low-heat cooking methods, such as baking, instead of high-heat cooking methods like deep frying.  Cook using healthy oils, such as olive, canola, or sunflower oil.  Avoid cooking with butter, cream, or high-fat meats. Meal planning  Eat  meals and snacks regularly, preferably at the same times  every day. Avoid going long periods of time without eating.  Eat foods high in fiber, such as fresh fruits, vegetables, beans, and whole grains. Talk to your dietitian about how many servings of carbs you can eat at each meal.  Eat 4-6 ounces (oz) of lean protein each day, such as lean meat, chicken, fish, eggs, or tofu. One oz of lean protein is equal to: ? 1 oz of meat, chicken, or fish. ? 1 egg. ?  cup of tofu.  Eat some foods each day that contain healthy fats, such as avocado, nuts, seeds, and fish. Lifestyle  Check your blood glucose regularly.  Exercise regularly as told by your health care provider. This may include: ? 150 minutes of moderate-intensity or vigorous-intensity exercise each week. This could be brisk walking, biking, or water aerobics. ? Stretching and doing strength exercises, such as yoga or weightlifting, at least 2 times a week.  Take medicines as told by your health care provider.  Do not use any products that contain nicotine or tobacco, such as cigarettes and e-cigarettes. If you need help quitting, ask your health care provider.  Work with a Social worker or diabetes educator to identify strategies to manage stress and any emotional and social challenges. Questions to ask a health care provider  Do I need to meet with a diabetes educator?  Do I need to meet with a dietitian?  What number can I call if I have questions?  When are the best times to check my blood glucose? Where to find more information:  American Diabetes Association: diabetes.org  Academy of Nutrition and Dietetics: www.eatright.[employer] of Diabetes and Digestive and Kidney Diseases (NIH): [employer] Summary  A healthy meal plan will help you control your blood glucose and maintain a healthy lifestyle.  Working with a diet and nutrition specialist (dietitian) can help you make a meal plan that is best for  you.  Keep in mind that carbohydrates (carbs) and alcohol have immediate effects on your blood glucose levels. It is important to count carbs and to use alcohol carefully. This information is not intended to replace advice given to you by your health care provider. Make sure you discuss any questions you have with your health care provider. Document Released: 01/14/2005 Document Revised: 11/17/2016 Document Reviewed: 05/24/2016 Elsevier Interactive Patient Education  2019 [employer].   Diabetic Ketoacidosis Diabetic ketoacidosis is a serious complication of diabetes. This condition develops when there is not enough insulin in the body. Insulin is an hormone that regulates blood sugar levels in the body. Normally, insulin allows glucose to enter the cells in the body. The cells break down glucose for energy. Without enough insulin, the body cannot break down glucose, so it breaks down fats instead. This leads to high blood glucose levels in the body and the production of acids that are called ketones. Ketones are poisonous at high levels. If diabetic ketoacidosis is not treated, it can cause severe dehydration and can lead to a coma or death. What are the causes? This condition develops when a lack of insulin causes the body to break down fats instead of glucose. This may be triggered by:  Stress on the body. This stress is brought on by an illness.  Infection.  Medicines that raise blood glucose levels.  Not taking diabetes medicine.  New onset of type 1 diabetes mellitus. What are the signs or symptoms? Symptoms of this condition include:  Fatigue.  Weight loss.  Excessive thirst.  Light-headedness.  Fruity or sweet-smelling breath.  Excessive urination.  Vision changes.  Confusion or irritability.  Nausea.  Vomiting.  Rapid breathing.  Abdominal pain.  Feeling flushed. How is this diagnosed? This condition is diagnosed based on your medical history, a physical  exam, and blood tests. You may also have a urine test to check for ketones. How is this treated? This condition may be treated with:  Fluid replacement. This may be done to correct dehydration.  Insulin injections. These may be given through the skin or through an IV tube.  Electrolyte replacement. Electrolytes are minerals in your blood. Electrolytes such as potassium and sodium may be given in pill form or through an IV tube.  Antibiotic medicines. These may be prescribed if your condition was caused by an infection. Diabetic ketoacidosis is a serious medical condition. You may need emergency treatment in the hospital to monitor your condition. Follow these instructions at home: Eating and drinking  Drink enough fluids to keep your urine clear or pale yellow.  If you are not able to eat, drink clear fluids in small amounts as you are able. Clear fluids include water, ice chips, fruit juice with water added (diluted), and low-calorie sports drinks. You may also have sugar-free jello or popsicles.  If you are able to eat, follow your usual diet and drink sugar-free liquids, such as water. Medicines  Take over-the-counter and prescription medicines only as told by your health care provider.  Continue to take insulin and other diabetes medicines as told by your health care provider.  If you were prescribed an antibiotic, take it as told by your health care provider. Do not stop taking the antibiotic even if you start to feel better. General instructions   Check your urine for ketones when you are ill and as told by your health care provider. ? If your blood glucose is 240 mg/dL (13.3 mmol/L) or higher, check your urine ketones every 4-6 hours.  Check your blood glucose every day, as often as told by your health care provider. ? If your blood glucose is high, drink plenty of fluids. This helps to flush out ketones. ? If your blood glucose is above your target for 2 tests in a row,  contact your health care provider.  Carry a medical alert card or wear medical alert jewelry that says that you have diabetes.  Rest and exercise only as told by your health care provider. Do not exercise when your blood glucose is high and you have ketones in your urine.  If you get sick, call your health care provider and begin treatment quickly. Your body often needs extra insulin to fight an illness. Check your blood glucose every 4-6 hours when you are sick.  Keep all follow-up visits as told by your health care provider. This is important. Contact a health care provider if:  Your blood glucose level is higher than 240 mg/dL (13.3 mmol/L) for 2 days in a row.  You have moderate or large ketones in your urine.  You have a fever.  You cannot eat or drink without vomiting.  You have been vomiting for more than 2 hours.  You continue to have symptoms of diabetic ketoacidosis.  You develop new symptoms. Get help right away if:  Your blood glucose monitor reads high even when you are taking insulin.  You faint.  You have chest pain.  You have trouble breathing.  You have sudden trouble speaking or swallowing.  You have vomiting or diarrhea that gets  worse after 3 hours.  You are unable to stay awake.  You have trouble thinking.  You are severely dehydrated. Symptoms of severe dehydration include: ? Extreme thirst. ? Dry mouth. ? Rapid breathing. These symptoms may represent a serious problem that is an emergency. Do not wait to see if the symptoms will go away. Get medical help right away. Call your local emergency services (911 in the [place] Do not drive yourself to the hospital. Summary  Diabetic ketoacidosis is a serious complication of diabetes. This condition develops when there is not enough insulin in the body.  This condition is diagnosed based on your medical history, a physical exam, and blood tests. You may also have a urine test to check for  ketones.  Diabetic ketoacidosis is a serious medical condition. You may need emergency treatment in the hospital to monitor your condition.  Contact your health care provider if your blood glucose is higher than 240 mg/dl for 2 days in a row or if you have moderate or large ketones in your urine. This information is not intended to replace advice given to you by your health care provider. Make sure you discuss any questions you have with your health care provider. Document Released: 04/16/2000 Document Revised: 05/24/2016 Document Reviewed: 05/24/2016 Elsevier Interactive Patient Education  2019 [employer].   How to Use Eye Drops and Eye Ointments Your health care provider may prescribe or recommend eye drops or ointments for a variety of reasons, such as to help relieve symptoms like redness, dryness, and itchiness. You may also need to use eye drops or ointments to treat an eye infection or before or after surgery on your eye. You should use eye drops and ointments only as told by your health care provider. You may need to have a caregiver or family member help you place eye drops or ointment in your eye. How to use eye drops Follow these steps when putting eye drops in your eye: 1. Wash your hands with soap and water. 2. Follow any instructions for mixing or shaking eye drops prior to using them. 3. Stand in front of a mirror so that you can see your eye well. 4. Place one finger under your eye and use it to gently pull your lower lid downward. This forms a small pocket to place the drop. Keep that finger in place. 5. Using your other hand, hold the dropper between your thumb and index finger. 6. Position the dropper just above the edge of your lower eyelid. Do not touch the dropper to your lid or your eyeball. 7. Steady your hand. One way to do this is to lean your index finger against your brow. 8. Look up slightly. 9. Slowly and gently squeeze one drop of medicine into your eye near  the lower lid. 10. Gently close your eye. 11. Place a finger between your lower eyelid and your nose. Press gently for 2 minutes. This increases the amount of time that the medicine is exposed to the eye and can help prevent certain side effects. 12. Do not rub your eye. How to apply eye ointments Follow these steps when applying eye ointments: 1. Wash your hands with soap and water. 2. Stand in front of a mirror so that you can see your eye well. 3. Place one finger under your eye and use it to gently pull your lower lid downward. Keep that finger in place. 4. Using your other hand, hold the tip of the tube between your thumb  and index finger. Brace your other fingers against your cheek or nose. 5. Hold the tube just above the edge of your lower eyelid. Do not touch the tube to your lid or your eyeball. 6. Line the inner part of your lower lid with ointment. 7. Let go of your lower lid. 8. Gently pull up on your upper lid and look down. This will spread the ointment over the surface of your eye. 9. Let go of the upper lid. 10. Gently close your eyes. If you can, leave them closed for 1-2 minutes. 11. Do not rub your eyes. If you applied the ointment correctly, your vision will be blurry for a few minutes. This is normal. General tips  Make sure you use the eye drops or ointment only as told by your health care provider.  Ask for help from a caregiver or family member if you are unable to apply the eye drops or ointment.  If you have been told to use both eye drops and an eye ointment, apply the eye drops first, then wait 3-4 minutes before you apply the ointment.  Try not to touch the tip of the dropper or tube to your eye. A dropper or tube that has touched the eye can get germs on it (get contaminated). Summary  Your health care provider may prescribe or recommend eye drops or ointments to relieve symptoms like redness, dryness, and itchiness.  Be sure to wash your hands with soap  and water before applying eye drops or ointment.  You may need to have a caregiver or family member help you place eye drops or ointment in your eye.  Make sure you use the eye drops or ointment only as told by your health care provider. This information is not intended to replace advice given to you by your health care provider. Make sure you discuss any questions you have with your health care provider. Document Released: 07/26/2000 Document Revised: 04/21/2017 Document Reviewed: 04/21/2017 Elsevier Interactive Patient Education  2019 [employer].   Insulin Storage and Care  All insulin pens and bottles (vials) have expiration dates. Insulin pens and vials that are refrigerated and unopened are good until the expiration date. Once opened, vials are good for 28 days. "Opened" means that the rubber has been punctured. Once in use, pen expiration dates vary depending on the type of insulin being used. Do not use insulin after the expiration date. Always follow the instructions that come with your insulin. How to store your insulin  Store your insulin according to instructions on the packaging.  If insulin is kept at room temperature, keep the temperature between 56-98F (13-27C). ? Opened insulin vials may be kept at room temperature, or in the refrigerator and warmed to room temperature before use. ? Opened insulin pens should be kept at room temperature.  If insulin is kept in the refrigerator, keep the temperature between 36-3F (3-8C).  Do not freeze insulin.  Keep insulin away from direct heat or sunlight. How to throw away your supplies  Throw away your insulin if: ? It is discolored. ? It is thick. ? It has clumps in it. ? It has white particles suspended in it.  Discard all used needles in a puncture-proof sharps disposal container. You can ask your local pharmacy about where you can get this kind of disposal container, or you can use an empty liquid laundry detergent  bottle that has a lid, for example.  Follow the disposal regulations for the area where  you live.  Do not use any syringe or needle more than one time.  Throw away empty vials and pens (with needles removed) in the regular trash. General recommendations  Always keep extra insulin and supplies with you.  Always inspect your insulin prior to injecting. Do not use if you notice any discoloration, particles, or clumping.  Mix cloudy insulin by gently rolling the vial between your hands or "rocking" the pen from end to end at least 10 times.  Do not leave insulin in your vehicle or in any place where it can get too hot or too cold.  Use an insulated travel pack to store your insulin vials or pens when traveling. Where to find more information  American Diabetes Association: www.diabetes.[employer] of Diabetes and Digestive and Kidney Diseases: [employer] Summary  Do not store insulin in extreme heat or cold, such as in the freezer, direct sunlight, or your vehicle.  Check the expiration date before using insulin and do not use insulin past the expiration date.  Check insulin before using to make sure it looks normal. Mix cloudy insulin before using. Do not use insulin if it is discolored, has particles, or clumps. This information is not intended to replace advice given to you by your health care provider. Make sure you discuss any questions you have with your health care provider. Document Released: 02/14/2009 Document Revised: 05/27/2016 Document Reviewed: 05/27/2016 Elsevier Interactive Patient Education  2019 [employer]   Preventing Diabetes Mellitus Complications You can take action to prevent or slow down problems that are caused by diabetes (diabetes mellitus). Following your diabetes plan and taking care of yourself can reduce your risk of serious or life-threatening complications. What actions can I take to prevent  diabetes complications? Manage your diabetes   Follow instructions from your health care providers about managing your diabetes. Your diabetes may be managed by a team of health care providers who can teach you how to care for yourself and can answer questions that you have.  Educate yourself about your condition so you can make healthy choices about eating and physical activity.  Check your blood sugar (glucose) levels as often as directed. Your health care provider will help you decide how often to check your blood glucose level depending on your treatment goals and how well you are meeting them.  Ask your health care provider if you should take low-dose aspirin daily and what dose is recommended for you. Taking low-dose aspirin daily is recommended to help prevent cardiovascular disease. Do not use nicotine or tobacco Do not use any products that contain nicotine or tobacco, such as cigarettes and e-cigarettes. If you need help quitting, ask your health care provider. Nicotine raises your risk for diabetes problems. If you quit using nicotine:  You will lower your risk for heart attack, stroke, nerve disease, and kidney disease.  Your cholesterol and blood pressure may improve.  Your blood circulation will improve. Keep your blood pressure under control Your personal target blood pressure is determined based on:  Your age.  Your medicines.  How long you have had diabetes.  Any other medical conditions you have. To control your blood pressure:  Follow instructions from your health care provider about meal planning, exercise, and medicines.  Make sure your health care provider checks your blood pressure at every medical visit.  Monitor your blood pressure at home as told by your health care provider.  Keep your cholesterol under control To control your cholesterol:  Follow  instructions from your health care provider about meal planning, exercise, and medicines.  Have your  cholesterol checked at least once a year.  You may be prescribed medicine to lower cholesterol (statin). If you are not taking a statin, ask your health care provider if you should be. Controlling your cholesterol may:  Help prevent heart disease and stroke. These are the most common health problems for people with diabetes.  Improve your blood flow. Schedule and keep yearly physical exams and eye exams Your health care provider will tell you how often you need medical visits depending on your diabetes management plan. Keep all follow-up visits as directed. This is important so possible problems can be identified early and complications can be avoided or treated.  Every visit with your health care provider should include measuring your: ? Weight. ? Blood pressure. ? Blood glucose control.  Your A1c (hemoglobin A1c) level should be checked: ? At least 2 times a year, if you are meeting your treatment goals. ? 4 times a year, if you are not meeting treatment goals or if your treatment goals have changed.  Your blood lipids (lipid profile) should be checked yearly. You should also be checked yearly for protein in your urine (urine microalbumin).  If you have type 1 diabetes, get an eye exam 3-5 years after you are diagnosed, and then once a year after your first exam.  If you have type 2 diabetes, get an eye exam as soon as you are diagnosed, and then once a year after your first exam. Keep your vaccines current It is recommended that you receive:  A flu (influenza) vaccine every year.  A pneumonia (pneumococcal) vaccine and a hepatitis B vaccine. If you are age 49 or older, you may get the pneumonia vaccine as a series of two separate shots. Ask your health care provider which other vaccines may be recommended. Take care of your feet Diabetes may cause you to have poor blood circulation to your legs and feet. Because of this, taking care of your feet is very important. Diabetes can  cause:  The skin on the feet to get thinner, break more easily, and heal more slowly.  Nerve damage in your legs and feet, which results in decreased feeling. You may not notice minor injuries that could lead to serious problems. To avoid foot problems:  Check your skin and feet every day for cuts, bruises, redness, blisters, or sores.  Schedule a foot exam with your health care provider once every year. This exam includes: ? Inspecting of the structure and skin of your feet. ? Checking the pulses and sensation in your feet.  Make sure that your health care provider performs a visual foot exam at every medical visit.  Take care of your teeth People with poorly controlled diabetes are more likely to have gum (periodontal) disease. Diabetes can make periodontal diseases harder to control. If not treated, periodontal diseases can lead to tooth loss. To prevent this:  Brush your teeth twice a day.  Floss at least once a day.  Visit your dentist 2 times a year. Drink responsibly Limit alcohol intake to no more than 1 drink a day for nonpregnant women and 2 drinks a day for men. One drink equals 12 oz of beer, 5 oz of wine, or 1 oz of hard liquor.  It is important to eat food when you drink alcohol to avoid low blood glucose (hypoglycemia). Avoid alcohol if you:  Have a history of alcohol abuse or  dependence.  Are pregnant.  Have liver disease, pancreatitis, advanced neuropathy, or severe hypertriglyceridemia. Lessen stress Living with diabetes can be stressful. When you are experiencing stress, your blood glucose may be affected in two ways:  Stress hormones may cause your blood glucose to rise.  You may be distracted from taking good care of yourself. Be aware of your stress level and make changes to help you manage challenging situations. To lower your stress levels:  Consider joining a support group.  Do planned relaxation or meditation.  Do a hobby that you  enjoy.  Maintain healthy relationships.  Exercise regularly.  Work with your health care provider or a mental health professional. Summary  You can take action to prevent or slow down problems that are caused by diabetes (diabetes mellitus). Following your diabetes plan and taking care of yourself can reduce your risk of serious or life-threatening complications.  Follow instructions from your health care providers about managing your diabetes. Your diabetes may be managed by a team of health care providers who can teach you how to care for yourself and can answer questions that you have.  Your health care provider will tell you how often you need medical visits depending on your diabetes management plan. Keep all follow-up visits as directed. This is important so possible problems can be identified early and complications can be avoided or treated. This information is not intended to replace advice given to you by your health care provider. Make sure you discuss any questions you have with your health care provider. Document Released: 01/05/2011 Document Revised: 12/07/2016 Document Reviewed: 01/17/2016 Elsevier Interactive Patient Education  2019 [employer]   Preventing Diabetic Ketoacidosis Diabetic ketoacidosis (DKA) is a life-threatening complication of diabetes (diabetes mellitus). It develops when there is not enough of a hormone called insulin in the body. If the body does not have enough insulin, it cannot divide (break down) sugar (glucose) into usable cells, so it breaks down fats instead. This leads to the production of acids (ketones), which can cause the blood to have too much acid in it (acidosis). DKA is a medical emergency that must be treated at the hospital. You may be more likely to develop DKA if you have type 1 diabetes and you take insulin. You can prevent DKA by working closely with your health care provider to manage your diabetes. What nutrition changes can be  made?   Follow your meal plan, as directed by your health care provider or diet and nutrition specialist (dietitian).  Eat healthy meals at about the same time every day. Have healthy snacks between meals.  Avoid not eating for long periods of time. Do not skip meals, especially if you are ill.  Avoid regularly eating foods that contain a lot of sugar. Also avoid drinking alcohol. Sugary food and alcohol increase your risk of high blood glucose (hyperglycemia), which increases your risk for DKA.  Drink enough fluid to keep your urine pale yellow. Dehydration increases your risk for DKA. What actions can I take to lower my risk? To lower your risk for diabetic ketoacidosis, manage your diabetes as directed by your health care provider:  Take insulin and other diabetes medicines as directed.  Check your blood glucose every day, as often as directed.  Follow your sick day plan whenever you cannot eat or drink as usual. Make this plan in advance with your health care provider.  Check your urine for ketones as often as directed. ? During times when you are sick,  check your ketones every 4-6 hours. ? If you develop symptoms of DKA, check your ketones right away.  If you have ketones in your urine: ? Contact your health care provider right away. ? Do not exercise.  Know the symptoms of DKA so that you can get treatment as soon as possible.  Make sure that people at work, home, and school know how to check your blood glucose, in case you are not able to do it yourself.  Carry a medical alert card or wear medical alert jewelry that says that you have diabetes. Why are these changes important? DKA is a warning sign that your diabetes is not being well-controlled. You may need to work with your health care provider to adjust your diabetes management plan. DKA can lead to a serious medical emergency that can be life-threatening. Where to find support For more support with preventing  DKA:  Talk with your health care provider.  Consider joining a support group. The American Diabetes Association has an online support community at: community.diabetes.org/home Where to find more information Learn more about preventing DKA from:  American Diabetes Association: www.diabetes.org  American Heart Association: www.heart.org Contact a health care provider if: You develop symptoms of DKA, such as:  Fatigue.  Weight loss.  Excessive thirst.  Light-headedness.  Fruity or sweet-smelling breath.  Excessive urination.  Vision changes.  Confusion or irritability.  Nausea.  Vomiting.  Rapid breathing.  Pain in the abdomen.  Feeling warm in your face (flushed). This may or may not include a reddish color coming to your face. If you develop any of these symptoms, do not wait to see if the symptoms will go away. Get medical help right away. Call your local emergency services (911 in the [place] Do not drive yourself to the hospital. Summary  DKA may be a warning sign that your diabetes is not being well-controlled. You may need to work with your health care provider to adjust your diabetes management plan.  Preventing high blood glucose and dehydration helps prevent DKA.  Check your urine for ketones as often as directed. You may need to check more often when your blood glucose level is high and when you are ill.  DKA is a medical emergency. Make sure you know the symptoms so that you can recognize and get treatment right away. This information is not intended to replace advice given to you by your health care provider. Make sure you discuss any questions you have with your health care provider. Document Released: 11/18/2016 Document Revised: 11/18/2016 Document Reviewed: 11/18/2016 Elsevier Interactive Patient Education  2019 [place].   Insulin Treatment for Diabetes Mellitus Diabetes (diabetes mellitus) is a long-term (chronic) disease. It occurs when the  body does not properly use sugar (glucose) that is released from food after digestion. Glucose levels are controlled by a hormone called insulin. Insulin is made in the pancreas, which is an organ behind the stomach.  If you have type 1 diabetes, you must take insulin because your pancreas does not make any.  If you have type 2 diabetes, you might need to take insulin along with other medicines. In type 2 diabetes, one or both of these problems may be present: ? The pancreas does not make enough insulin. ? Cells in the body do not respond properly to insulin that the body makes (insulin resistance). You must use insulin correctly to control your diabetes. You must have some insulin in your body at all times. Insulin treatment varies depending on  your type of diabetes, your treatment goals, and your medical history. Ask questions to understand your insulin treatment plan so you can be an active partner in managing your diabetes. How is insulin given? Insulin can only be given through a shot (injection). It is injected using a syringe and needle, an insulin pen, a pump, or a jet injector. Your health care provider will:  Prescribe the type and amount of insulin that you need.  Tell you when you should inject your insulin. Where on the body should insulin be injected? Insulin is injected into a layer of fatty tissue under the skin. Good places to inject insulin include:  Abdomen. Generally, the abdomen is the best place to inject insulin. However, you should avoid any area that is less than 2 inches (5 cm) from the belly button (navel).  Front of thigh.  Upper, outer side of thigh.  Upper, outer side of arm.  Upper, outer part of buttock. It is important to:  Give your injection in a slightly different place each time. This helps to prevent irritation and improve absorption.  Avoid injecting into areas that have scar tissue. Usually, you will give yourself insulin injections. Others can  also be taught how to give you injections. You will use a special type of syringe that is made only for insulin. Some people may have an insulin pump that delivers insulin steadily through a tube (cannula) that is placed under the skin. What are the different types of insulin? The following information is a general guide to different types of insulin. Specifics vary depending on the insulin product that your health care provider prescribes.  Rapid-acting insulin: ? Starts working quickly, in as little as 5 minutes. ? Can last for 4-6 hours (or sometimes longer). ? Works well when taken right before a meal to quickly lower your blood glucose.  Short-acting insulin: ? Starts working in about 30 minutes. ? Can last for 6-10 hours. ? Should be taken about 30 minutes before you start eating a meal.  Intermediate-acting insulin: ? Starts working in 1-2 hours. ? Lasts for about 10-18 hours. ? Lowers your blood glucose for a longer period of time, but it is not as effective for lowering blood glucose right after a meal.  Long-acting insulin: ? Mimics the small amount of insulin that your pancreas usually produces throughout the day. ? Should be used one or two times a day. ? Is usually used in combination with other types of insulin or other medicines.  Concentrated insulin, or U-500 insulin: ? Contains a higher dose of insulin than most rapid-acting insulins. U-500 insulin has 5 times the amount of insulin per 1 mL. ? Should only be used with the special U-500 syringe or U-500 insulin pen. It is dangerous to use the wrong type of syringe with this insulin. What are the side effects of insulin? Possible side effects of insulin treatment include:  Low blood glucose (hypoglycemia).  Weight gain.  High blood glucose (hyperglycemia).  Skin injury or irritation. Some of these side effects can be caused by using improper injection technique. Be sure to learn how to inject insulin  properly. What are common terms associated with insulin treatment? Some terms that you might hear include:  Basal insulin, or basal rate. This is the constant amount of insulin that needs to be present in your body to keep your blood glucose levels stable. People who have type 1 diabetes need basal insulin in a steady (continuous) dose 24 hours a  day. ? Usually, intermediate-acting or long-acting insulin is used one or two times a day to manage basal insulin levels. ? Medicines that are taken by mouth may also be recommended to manage basal insulin levels.  Prandial or nutrition insulin. This refers to meal-related insulin. ? Blood glucose rises quickly after a meal (postprandial). Rapid-acting or short-acting insulin can be used right before a meal (preprandial) to quickly lower your blood glucose. ? You may be instructed to adjust the amount of prandial insulin that you take, based on how much carbohydrate (starch) is in your meal.  Corrective insulin. This may also be called a correction dose or supplemental dose. This is a small amount of rapid-acting or short-acting insulin that can be used to lower your blood glucose if it is too high. You may be instructed to check your blood glucose at certain times of the day and use corrective insulin as needed.  Tight control, or intensive therapy. This means keeping your blood glucose as close to your target as possible, and preventing your blood glucose from getting too high after meals. People who have tight control of their diabetes have fewer long-term problems caused by diabetes. Follow these instructions at home: Talk with your health care provider or pharmacist about the type of insulin you should take and when you should take it. You should know when your insulin goes up the most (peaks) and when it wears off. You need this information so you can plan your meals and exercise. Work with your health care provider to:  Check your blood glucose  every day. Your health care provider will tell you how often and when you should do this.  Manage your: ? Weight. ? Blood pressure. ? Cholesterol. ? Stress.  Eat a healthy diet.  Exercise regularly. Summary  Diabetes is a long-term (chronic) disease. It occurs when the body does not properly use sugar (glucose) that is released from food after digestion. Glucose levels are controlled by a hormone called insulin, which is made in an organ behind your stomach (pancreas).  You must use insulin correctly to control your diabetes. You must have some insulin in your body at all times.  Insulin treatment varies depending on your type of diabetes, your treatment goals, and your medical history.  Talk with your health care provider or pharmacist about the type of insulin you should take and when you should take it.  Check your blood glucose every day. Your health care provider will tell you how often and when you should check it. This information is not intended to replace advice given to you by your health care provider. Make sure you discuss any questions you have with your health care provider. Document Released: 07/16/2008 Document Revised: 11/10/2016 Document Reviewed: 05/23/2015 Elsevier Interactive Patient Education  2019 [employer] Inc[employer]   Insulin Resistance  Insulin is a hormone that helps to control blood sugar (glucose) levels in the body. It is made in the pancreas. Insulin allows glucose to enter cells in the body. Insulin sensitivity refers to how the body responds to insulin. Insulin resistance occurs when cells in the body do not respond properly to insulin made by the pancreas and are not able to absorb glucose from the bloodstream. Insulin resistance results in high blood glucose levels (hyperglycemia) and can lead to problems, including:  Prediabetes.  Type 2 diabetes (type 2 diabetes mellitus).  Heart disease.  High blood pressure  (hypertension).  Stroke.  Polycystic ovary syndrome (PCOS).  Nonalcoholic fatty liver disease.  What are the causes? The exact cause of insulin resistance is not known. What increases the risk? The following factors may make you more likely to develop insulin resistance:  Being overweight or obese, especially if a lot of your weight is in your waist area.  Having an inactive (sedentary) lifestyle.  Using steroids.  Being older than age 29.  Having sleep apnea.  Using tobacco products. What are the signs or symptoms? This condition usually does not cause symptoms. How is this diagnosed? There is no test to diagnose insulin resistance. However, your health care provider may diagnose insulin resistance based on:  Your blood glucose levels.  Your cholesterol levels.  A measurement of the distance around your waist (circumference). A waist circumference of more than 35 inches (88.9 cm) for women and more than 40 inches (101.6 cm) for men may be a sign of insulin resistance.  Your risk factors.  A physical exam.  Your medical history. How is this treated? Insulin resistance is treated with nutrition and lifestyle changes. These changes may include:  Eating a healthy balance of nutritious foods.  Getting more physical activity.  Maintaining a healthy weight.  Stopping the use of any tobacco products. Your health care provider will work with you to change your nutrition and lifestyle as needed. In some cases, treatment may also include medicine to improve your insulin sensitivity. Follow these instructions at home: Activity  Be physically active. Do moderate-intensity physical activity for 30 minutes or more on 5 or more days of the week, or as much as told by your health care provider. This could be brisk walking, biking, or water aerobics.  Ask your health care provider what activities are safe for you. A mix of physical activities may be best, such as walking,  swimming, cycling, and strength training. Eating and drinking   Follow a healthy meal plan. This includes eating lean proteins, complex carbohydrates, fresh fruits and vegetables, low-fat dairy products, and healthy fats.  Follow instructions from your health care provider about eating or drinking restrictions.  Make an appointment to see a diet and nutrition specialist (registered dietitian) to help you create a healthy eating plan. General instructions  Check your blood glucose levels as told by your health care provider.  Take over-the-counter and prescription medicines only as told by your health care provider.  Lose weight as told by your health care provider. ? Losing 5-7% of your body weight can reverse insulin resistance. ? Your health care provider can determine how much weight loss is best for you and can help you lose weight safely.  Do not use any tobacco products, such as cigarettes, chewing tobacco, and e-cigarettes. If you need help quitting, ask your health care provider.  Keep all follow-up visits as told by your health care provider. This is important. Contact a health care provider if:  You have trouble losing weight or maintaining your goal weight.  You gain weight.  You have trouble following your prescribed meal plan.  You have trouble exercising more. Summary  Insulin resistance occurs when cells in the body do not respond properly to insulin made by the pancreas and are not able to absorb blood sugar (glucose) from the bloodstream.  Your health care provider will work with you to change your nutrition and lifestyle as needed. Treatment may also include medicine to improve your insulin sensitivity.  Keep all follow-up visits as told by your health care provider. This is important. This information is not intended to replace  advice given to you by your health care provider. Make sure you discuss any questions you have with your health care  provider. Document Released: 06/08/2005 Document Revised: 12/07/2016 Document Reviewed: 05/23/2015 Elsevier Interactive Patient Education  2019 [place] for Eating Away From Home If You Have Diabetes Controlling your blood sugar (glucose) levels can be challenging when you do not prepare your own meals. The following tips can help you manage your diabetes when you eat away from home. If you have questions or if you need help, work with your health care provider or diet and nutrition specialist (dietitian). Planning ahead Plan ahead if you know you will be eating away from home:  Try to eat your meals and snacks at about the same time each day. If you know your meal is going to be later than normal, make sure you have a small snack. Being very hungry can cause you to make unhealthy food choices.  Make a list of restaurants near you that offer healthy choices. If a restaurant has a carry-out menu, take the menu home and plan what you will order ahead of time.  Look up the restaurant you want to eat at online. Many chain and fast-food restaurants list nutritional information online. Use this information to choose the healthiest options and to calculate how many carbohydrates will be in your meal.  Use a carbohydrate-counting book or mobile app to look up the carbohydrate content and serving size of the foods you want to eat. Free foods A "free food" is any food or drink that has less than 5 grams of carbohydrates and less than 20 calories per serving. These food are high in fiber and nutrients and low in calories, carbohydrates, and fats. Free foods include:  Non-starchy vegetables, such as carrots, broccoli, celery, lettuce, or green beans.  Non-sugar drinks, such as water, unsweetened coffee, or unsweetened tea.  Low-calorie salad dressings.  Sugar-free gelatin. Starting meals with a salad full of vegetables is a healthy choice that includes a lot of free foods. Avoid  high-calorie salad toppings like bacon, cheese, and high-fat dressings. Ask for your salad dressing to be served on the side so that you dip your fork in the dressing and then in the salad. This allows you to control how much dressing you eat and still get the flavor with every bite. Choices to control carbohydrates   Ask your server to take away the bread basket or chips from your table.  Choose light yogurt or [place] yogurt instead of non-fat sweetened yogurt.  Order fresh fruit. A salad bar often offers fresh fruit choices. Avoid canned fruit because it is usually packed in sugar or syrup.  Order a salad, and ask for dressing on the side.  Ask for substitutes. For example, if your meal comes with french fries, ask for a side salad or steamed veggies instead. If a meal comes with fried chicken, ask for grilled chicken instead. Beverages  Choose drinks that are low in calories and sugar, such as: ? Water. ? Unsweetened tea or coffee. ? Lowfat milk.  Avoid the following drinks: ? Alcoholic beverages. ? Regular (not diet) sodas. Other tips  If you take insulin, wait to take your insulin once your food arrives to your table. This will ensure that your insulin and your food are timed correctly.  Become familiar with serving sizes and learn to recognize how many servings are in a portion. Restaurant portions are typically two to three times larger than  what you really need.  Ask your server for a to-go box at the beginning of the meal. When your food comes, leave the amount you should have on your plate, and put the rest in the to-go box so that you are not tempted to eat too much.  Consider splitting an entree with someone and ordering a side salad.  Avoid buffets. They are typically too tempting and result in overeating. Where to find more information  American Diabetes Association: www.diabetes.org  American Association of Diabetes Educators:  www.diabeteseducator.org Summary  Plan ahead when eating away from home.  Try to eat your meals and snacks at about the same time each day. If you know your meal is going to be later than normal, make sure you have a small snack. Being very hungry can cause you to make unhealthy food choices.  Ask for substitutes. For example, if your meal comes with french fries, ask for a side salad or steamed veggies instead. If a meal comes with fried chicken, ask for grilled chicken instead.  Ask for a to-go box when you order your meal. Divide your meal before you start eating. This information is not intended to replace advice given to you by your health care provider. Make sure you discuss any questions you have with your health care provider. Document Released: 04/19/2005 Document Revised: 07/28/2016 Document Reviewed: 07/28/2016 Elsevier Interactive Patient Education  2019 [place].   Type 2 Diabetes Mellitus, Self Care, Adult When you have type 2 diabetes (type 2 diabetes mellitus), you must make sure your blood sugar (glucose) stays in a healthy range. You can do this with:  Nutrition.  Exercise.  Lifestyle changes.  Medicines or insulin, if needed.  Support from your doctors and others. How to stay aware of blood sugar   Check your blood sugar level every day, as often as told.  Have your A1c (hemoglobin A1c) level checked two or more times a year. Have it checked more often if your doctor tells you to. Your doctor will set personal treatment goals for you. Generally, you should have these blood sugar levels:  Before meals (preprandial): 80-130 mg/dL (4.4-7.2 mmol/L).  After meals (postprandial): below 180 mg/dL (10 mmol/L).  A1c level: less than 7%. How to manage high and low blood sugar Signs of high blood sugar High blood sugar is called hyperglycemia. Know the signs of high blood sugar. Signs may include:  Feeling: ? Thirsty. ? Hungry. ? Very tired.  Needing to  pee (urinate) more than usual.  Blurry vision. Signs of low blood sugar Low blood sugar is called hypoglycemia. This is when blood sugar is at or below 70 mg/dL (3.9 mmol/L). Signs may include:  Feeling: ? Hungry. ? Worried or nervous (anxious). ? Sweaty and clammy. ? Confused. ? Dizzy. ? Sleepy. ? Sick to your stomach (nauseous).  Having: ? A fast heartbeat. ? A headache. ? A change in your vision. ? Jerky movements that you cannot control (seizure). ? Tingling or no feeling (numbness) around your mouth, lips, or tongue.  Having trouble with: ? Moving (coordination). ? Sleeping. ? Passing out (fainting). ? Getting upset easily (irritability). Treating low blood sugar To treat low blood sugar, eat or drink something sugary right away. If you can think clearly and swallow safely, follow the 15:15 rule:  Take 15 grams of a fast-acting carb (carbohydrate). Talk with your doctor about how much you should take.  Some fast-acting carbs are: ? Sugar tablets (glucose pills). Take 3-4 pills. ?  6-8 pieces of hard candy. ? 4-6 oz (120-150 mL) of fruit juice. ? 4-6 oz (120-150 mL) of regular (not diet) soda. ? 1 Tbsp (15 mL) honey or sugar.  Check your blood sugar 15 minutes after you take the carb.  If your blood sugar is still at or below 70 mg/dL (3.9 mmol/L), take 15 grams of a carb again.  If your blood sugar does not go above 70 mg/dL (3.9 mmol/L) after 3 tries, get help right away.  After your blood sugar goes back to normal, eat a meal or a snack within 1 hour. Treating very low blood sugar If your blood sugar is at or below 54 mg/dL (3 mmol/L), you have very low blood sugar (severe hypoglycemia). This is an emergency. Do not wait to see if the symptoms will go away. Get medical help right away. Call your local emergency services (911 in the [place] If you have very low blood sugar and you cannot eat or drink, you may need a glucagon shot (injection). A family member or  friend should learn how to check your blood sugar and how to give you a glucagon shot. Ask your doctor if you need to have a glucagon shot kit at home. Follow these instructions at home: Medicine  Take insulin and diabetes medicines as told.  If your doctor says you should take more or less insulin and medicines, do this exactly as told.  Do not run out of insulin or medicines. Having diabetes can raise your risk for other long-term conditions. These include heart disease and kidney disease. Your doctor may prescribe medicines to help you not have these problems. Food   Make healthy food choices. These include: ? Chicken, fish, egg whites, and beans. ? Oats, whole wheat, bulgur, brown rice, quinoa, and millet. ? Fresh fruits and vegetables. ? Low-fat dairy products. ? Nuts, avocado, olive oil, and canola oil.  Meet with a food specialist (dietitian). He or she can help you make an eating plan that is right for you.  Follow instructions from your doctor about what you cannot eat or drink.  Drink enough fluid to keep your pee (urine) pale yellow.  Keep track of carbs that you eat. Do this by reading food labels and learning food serving sizes.  Follow your sick day plan when you cannot eat or drink normally. Make this plan with your doctor so it is ready to use. Activity  Exercise 3 or more times a week.  Do not go more than 2 days without exercising.  Talk with your doctor before you start a new exercise. Your doctor may need to tell you to change: ? How much insulin or medicines you take. ? How much food you eat. Lifestyle  Do not use any tobacco products. These include cigarettes, chewing tobacco, and e-cigarettes. If you need help quitting, ask your doctor.  Ask your doctor how much alcohol is safe for you.  Learn to deal with stress. If you need help with this, ask your doctor. Body care   Stay up to date with your shots (immunizations).  Have your eyes and feet  checked by a doctor as often as told.  Check your skin and feet every day. Check for cuts, bruises, redness, blisters, or sores.  Brush your teeth and gums two times a day. Floss one or more times a day.  Go to the dentist one or more times every 6 months.  Stay at a healthy weight. General instructions  Take  over-the-counter and prescription medicines only as told by your doctor.  Share your diabetes care plan with: ? Your work or school. ? People you live with.  Carry a card or wear jewelry that says you have diabetes.  Keep all follow-up visits as told by your doctor. This is important. Questions to ask your doctor  Do I need to meet with a diabetes educator?  Where can I find a support group for people with diabetes? Where to find more information To learn more about diabetes, visit:  American Diabetes Association: www.diabetes.org  American Association of Diabetes Educators: www.diabeteseducator.org Summary  When you have type 2 diabetes, you must make sure your blood sugar (glucose) stays in a healthy range.  Check your blood sugar every day, as often as told.  Having diabetes can raise your risk for other conditions. Your doctor may prescribe medicines to help you not have these problems.  Keep all follow-up visits as told by your doctor. This is important. This information is not intended to replace advice given to you by your health care provider. Make sure you discuss any questions you have with your health care provider. Document Released: 08/11/2015 Document Revised: 10/10/2017 Document Reviewed: 05/23/2015 Elsevier Interactive Patient Education  2019 [place].   Type 2 Diabetes Mellitus, Diagnosis, Adult Type 2 diabetes (type 2 diabetes mellitus) is a long-term (chronic) disease. It may be caused by one or both of these problems:  Your pancreas does not make enough of a hormone called insulin.  Your body does not react in a normal way to insulin  that it makes. Insulin lets sugars (glucose) go into cells in your body. This gives you energy. If you have type 2 diabetes, sugars cannot get into cells. This causes high blood sugar (hyperglycemia). Your doctor will set treatment goals for you. Generally, you should have these blood sugar levels:  Before meals (preprandial): 80-130 mg/dL (4.4-7.2 mmol/L).  After meals (postprandial): below 180 mg/dL (10 mmol/L).  A1c (hemoglobin A1c) level: less than 7%. Follow these instructions at home: Questions to ask your doctor  You may want to ask these questions: ? Do I need to meet with a diabetes educator? ? Where can I find a support group for people with diabetes? ? What equipment will I need to care for myself at home? ? What diabetes medicines do I need? When should I take them? ? How often do I need to check my blood sugar? ? What number can I call if I have questions? ? When is my next doctor's visit? General instructions  Take over-the-counter and prescription medicines only as told by your doctor.  Keep all follow-up visits as told by your doctor. This is important. Contact a doctor if:  Your blood sugar is at or above 240 mg/dL (13.3 mmol/L) for 2 days in a row.  You have been sick for 2 days or more, and you are not getting better.  You have had a fever for 2 days or more, and you are not getting better.  You have any of these problems for more than 6 hours: ? You cannot eat or drink. ? You feel sick to your stomach (nauseous). ? You throw up (vomit). ? You have watery poop (diarrhea). Get help right away if:  Your blood sugar is lower than 54 mg/dL (3 mmol/L).  You get confused.  You have trouble: ? Thinking clearly. ? Breathing.  You have moderate or large ketone levels in your pee (urine). Summary  Type 2 diabetes is a long-term (chronic) disease. Your pancreas may not make enough of a hormone called insulin, or your body may not react normally to insulin  that it makes.  Take over-the-counter and prescription medicines only as told by your doctor.  Keep all follow-up visits as told by your doctor. This is important. This information is not intended to replace advice given to you by your health care provider. Make sure you discuss any questions you have with your health care provider. Document Released: 01/27/2008 Document Revised: 11/18/2016 Document Reviewed: 05/23/2015 Elsevier Interactive Patient Education  2019 [place].   Type 2 Diabetes Mellitus, Self Care, Adult Caring for yourself after you have been diagnosed with type 2 diabetes (type 2 diabetes mellitus) means keeping your blood sugar (glucose) under control with a balance of:  Nutrition.  Exercise.  Lifestyle changes.  Medicines or insulin, if necessary.  Support from your team of health care providers and others. The following information explains what you need to know to manage your diabetes at home. What are the risks? Having diabetes can put you at risk for other long-term (chronic) conditions, such as heart disease and kidney disease. Your health care provider may prescribe medicines to help prevent complications from diabetes. These medicines may include:  Aspirin.  Medicine to lower cholesterol.  Medicine to control blood pressure. How to monitor blood glucose   Check your blood glucose every day, as often as told by your health care provider.  Have your A1c (hemoglobin A1c) level checked two or more times a year, or as often as told by your health care provider. Your health care provider will set individualized treatment goals for you. Generally, the goal of treatment is to maintain the following blood glucose levels:  Before meals (preprandial): 80-130 mg/dL (4.4-7.2 mmol/L).  After meals (postprandial): below 180 mg/dL (10 mmol/L).  A1c level: less than 7%. How to manage hyperglycemia and hypoglycemia Hyperglycemia symptoms Hyperglycemia, also  called high blood glucose, occurs when blood glucose is too high. Make sure you know the early signs of hyperglycemia, such as:  Increased thirst.  Hunger.  Feeling very tired.  Needing to urinate more often than usual.  Blurry vision. Hypoglycemia symptoms Hypoglycemia, also called low blood glucose, occurswith a blood glucose level at or below 70 mg/dL (3.9 mmol/L). The risk for hypoglycemia increases during or after exercise, during sleep, during illness, and when skipping meals or not eating for a long time (fasting). It is important to know the symptoms of hypoglycemia and treat it right away. Always have a 15-gram rapid-acting carbohydrate snack with you to treat low blood glucose. Family members and close friends should also know the symptoms and should understand how to treat hypoglycemia, in case you are not able to treat yourself. Symptoms may include:  Hunger.  Anxiety.  Sweating and feeling clammy.  Confusion.  Dizziness or feeling light-headed.  Sleepiness.  Nausea.  Increased heart rate.  Headache.  Blurry vision.  Irritability.  A change in coordination.  Tingling or numbness around the mouth, lips, or tongue.  Restless sleep.  Fainting.  Seizure. Treating hypoglycemia If you are alert and able to swallow safely, follow the 15:15 rule:  Take 15 grams of a rapid-acting carbohydrate. Talk with your health care provider about how much you should take.  Rapid-acting options include: ? Glucose pills (take 15 grams). ? 6-8 pieces of hard candy. ? 4-6 oz (120-150 mL) of fruit juice. ? 4-6 oz (120-150 mL) of regular (not  diet) soda. ? 1 Tbsp (15 mL) honey or sugar.  Check your blood glucose 15 minutes after you take the carbohydrate.  If the repeat blood glucose level is still at or below 70 mg/dL (3.9 mmol/L), take 15 grams of a carbohydrate again.  If your blood glucose level does not increase above 70 mg/dL (3.9 mmol/L) after 3 tries, seek  emergency medical care.  After your blood glucose level returns to normal, eat a meal or a snack within 1 hour. Treating severe hypoglycemia Severe hypoglycemia is when your blood glucose level is at or below 54 mg/dL (3 mmol/L). Severe hypoglycemia is an emergency. Do not wait to see if the symptoms will go away. Get medical help right away. Call your local emergency services (911 in the [place] If you have severe hypoglycemia and you cannot eat or drink, you may need an injection of glucagon. A family member or close friend should learn how to check your blood glucose and how to give you a glucagon injection. Ask your health care provider if you need to have an emergency glucagon injection kit available. Severe hypoglycemia may need to be treated in a hospital. The treatment may include getting glucose through an IV. You may also need treatment for the cause of your hypoglycemia. Follow these instructions at home: Take diabetes medicines as told  If your health care provider prescribed insulin or diabetes medicines, take them every day.  Do not run out of insulin or other diabetes medicines that you take. Plan ahead so you always have these available.  If you use insulin, adjust your dosage based on how physically active you are and what foods you eat. Your health care provider will tell you how to adjust your dosage. Make healthy food choices  The things that you eat and drink affect your blood glucose and your insulin dosage. Making good choices helps to control your diabetes and prevent other health problems. A healthy meal plan includes eating lean proteins, complex carbohydrates, fresh fruits and vegetables, low-fat dairy products, and healthy fats. Make an appointment to see a diet and nutrition specialist (registered dietitian) to help you create an eating plan that is right for you. Make sure that you:  Follow instructions from your health care provider about eating or drinking  restrictions.  Drink enough fluid to keep your urine pale yellow.  Keep a record of the carbohydrates that you eat. Do this by reading food labels and learning the standard serving sizes of foods.  Follow your sick day plan whenever you cannot eat or drink as usual. Make this plan in advance with your health care provider.  Stay active Exercise regularly, as told by your health care provider. This may include:  Stretching and doing strength exercises, such as yoga or weightlifting, 2 or more times a week.  Doing 150 minutes or more of moderate-intensity or vigorous-intensity exercise each week. This could be brisk walking, biking, or water aerobics. ? Spread out your activity over 3 or more days of the week. ? Do not go more than 2 days in a row without doing some kind of physical activity. When you start a new exercise or activity, work with your health care provider to adjust your insulin, medicines, or food intake as needed. Make healthy lifestyle choices  Do not use any tobacco products, such as cigarettes, chewing tobacco, and e-cigarettes. If you need help quitting, ask your health care provider.  If your health care provider says that alcohol is safe  for you, limit alcohol intake to no more than 1 drink per day for nonpregnant women and 2 drinks per day for men. One drink equals 12 oz of beer (355 mL), 5 oz of wine (148 mL), or 1 oz of hard liquor (44 mL).  Learn to manage stress. If you need help with this, ask your health care provider. Care for your body   Keep your immunizations up to date. In addition to getting vaccinations as told by your health care provider, it is recommended that you get vaccinated against the following illnesses: ? The flu (influenza). Get a flu shot every year. ? Pneumonia. ? Hepatitis B.  Schedule an eye exam soon after your diagnosis, and then one time every year after that.  Check your skin and feet every day for cuts, bruises, redness,  blisters, or sores. Schedule a foot exam with your health care provider once every year.  Brush your teeth and gums two times a day, and floss one or more times a day. Visit your dentist one or more times every 6 months.  Maintain a healthy weight. General instructions  Take over-the-counter and prescription medicines only as told by your health care provider.  Share your diabetes management plan with people in your workplace, school, and household.  Carry a medical alert card or wear medical alert jewelry.  Keep all follow-up visits as told by your health care provider. This is important. Questions to ask your health care provider  Do I need to meet with a diabetes educator?  Where can I find a support group for people with diabetes? Where to find more information For more information about diabetes, visit:  American Diabetes Association (ADA): www.diabetes.org  American Association of Diabetes Educators (AADE): www.diabeteseducator.org Summary  Caring for yourself after you have been diagnosed with (type 2 diabetes mellitus) means keeping your blood sugar (glucose) under control with a balance of nutrition, exercise, lifestyle changes, and medicine.  Check your blood glucose every day, as often as told by your health care provider.  Having diabetes can put you at risk for other long-term (chronic) conditions, such as heart disease and kidney disease. Your health care provider may prescribe medicines to help prevent complications from diabetes.  Keep all follow-up visits as told by your health care provider. This is important. This information is not intended to replace advice given to you by your health care provider. Make sure you discuss any questions you have with your health care provider. Document Released: 08/11/2015 Document Revised: 10/10/2017 Document Reviewed: 05/23/2015 Elsevier Interactive Patient Education  2019 [employer].

## 2018-05-14 NOTE — Discharge Summary (Signed)
Discharge Summary  Perry Li [id number] DOB: [birth year]  PCP: Darreld Mclean, MD  Admit date: 05/11/2018 Discharge date: 05/14/2018  Time spent: 35 minutes  Recommendations for Outpatient Follow-up:  1. Follow-up with your PCP within a week 2. Take your medications as prescribed 3. Follow-up with diabetes coordinator outpatient  Discharge Diagnoses:  Active Hospital Problems   Diagnosis Date Noted  . DKA (diabetic ketoacidoses) ([place]) 05/11/2018  . Dehydration 05/12/2018  . Hyperglycemia 05/12/2018  . Anxiety     Resolved Hospital Problems  No resolved problems to display.    Discharge Condition: Stable  Diet recommendation: Low-carb diabetes diet  Vitals:   05/13/18 2019 05/14/18 0545  BP: 123/83 125/67  Pulse: 86 68  Resp: 18 16  Temp: 98.6 F (37 C) 98.1 F (36.7 C)  SpO2: 99% 100%    History of present illness:  Perry Li is a 33 y.o. malewith medical history significant for anxiety, who is admitted to [facility] long hospital on 05/11/2018 by way of transfer from [place] emergency department for further evaluation and management of DKA in the setting of new diagnosis of presumed type II diabetes after presenting to the latter facility for further evaluation of hyperglycemia.   05/12/18: Purulent drainage noted in his eyes bilaterally.  Treated with ophthalmic drops.  Anion gap has closed and has been transitioned to long-acting insulin.  Diabetes coordinator has been consulted for insulin education.  Awaiting A1c and results of anti-islet cell antibodies.  Reports mild nausea with no vomiting.  05/13/18: No new complaints. But persistent hyperglycemia requiring insulin adjustment. Also dyselectrolytemia. Continue inpt treatment.   05/14/2018: Patient seen and examined at bedside.  No acute events overnight.  His anion gap is closed and his chemistry bicarb has normalized.  He denies any nausea, abdominal pain or dizziness.  He has no  new complaints.  Tolerating a diet well.  He has been educated by diabetes coordinator and nursing staff on how to use insulin.  On the day of discharge, the patient was hemodynamically stable.  He will need to follow-up with his primary care provider and also with diabetes coordinator outpatient.  Hospital Course:  Principal Problem:   DKA (diabetic ketoacidoses) (H[facility]) Active Problems:   Anxiety   Dehydration   Hyperglycemia  Newly diagnosed DKA, unspecified type with hyperglycemia Anti- islet cell antibodies pending Presumed type 2 diabetes with obesity and elevated A1C 12.1 Increase Levemir to 14 units twice daily Increase NovoLog 12 units before meals Diabetes coordinator consulted for insulin education and management Low-carb diabetes diet C/ education with diabetes coordinator outpatient Follow-up with your PCP within a week  Newly diagnosed Diabetes, presumed type II Management as stated above Will need compliance to insulin regimen and lifetime modification with weight loss and reg physical activity Follow-up with your PCP within a week Follow-up with diabetes coordinator outpatient  Resolved anion gap metabolic acidosis  Resolving hypokalemia post repletion  Continue potassium supplement 20 mEq daily x7 days Follow-up with your PCP Repeat BMP on Wednesday, 05/17/2018  Acute bilateral bacterial conjunctivitis, resolving Continue Polytrim ophthalmic solution drops x7 days.  End date 05/18/2018. Monitor for clearance of purulent drainage No reported loss of vision.  Recent balanitis Self-reported Was treated outpatient approximately 6 weeks ago  Generalized anxiety C/w home medications trazodone and Klonopin  Obesity BMI 34 Recommend weight loss outpatient with regular physical activity and healthy dieting. Follow-up with your PCP   Code Status: Full code   Consultants:  Diabetes  coordinator  Procedures: None  Antimicrobials:  Ophthalmic drops for suspected acute bilateral conjunctivitis  DVT prophylaxis: Subcu Lovenox daily  Discharge Exam: BP 125/67 (BP Location: Right Arm)   Pulse 68   Temp 98.1 F (36.7 C) (Oral)   Resp 16   Ht 6' (1.829 m)   Wt 117.5 kg   SpO2 100%   BMI 35.13 kg/m  . General: 33 y.o. year-old male well developed well nourished in no acute distress.  Alert and oriented x3. . Cardiovascular: Regular rate and rhythm with no rubs or gallops.  No thyromegaly or JVD noted.   Marland Kitchen Respiratory: Clear to auscultation with no wheezes or rales. Good inspiratory effort. . Abdomen: Soft nontender nondistended with normal bowel sounds x4 quadrants. . Musculoskeletal: No lower extremity edema. 2/4 pulses in all 4 extremities. . Skin: No ulcerative lesions noted or rashes, . Psychiatry: Mood is appropriate for condition and setting  Discharge Instructions You were cared for by a hospitalist during your hospital stay. If you have any questions about your discharge medications or the care you received while you were in the hospital after you are discharged, you can call the unit and asked to speak with the hospitalist on call if the hospitalist that took care of you is not available. Once you are discharged, your primary care physician will handle any further medical issues. Please note that NO REFILLS for any discharge medications will be authorized once you are discharged, as it is imperative that you return to your primary care physician (or establish a relationship with a primary care physician if you do not have one) for your aftercare needs so that they can reassess your need for medications and monitor your lab values.  Discharge Instructions    Ambulatory referral to Nutrition and Diabetic Education   Complete by:  As directed      Allergies as of 05/14/2018      Reactions   Eggs Or Egg-derived Products Swelling   Lip swelling       Medication List    STOP taking these medications   metFORMIN 500 MG tablet Commonly known as:  GLUCOPHAGE     TAKE these medications   blood glucose meter kit and supplies Dispense based on patient and insurance preference. Use up to twice daily as directed. (FOR ICD-10 E10.9, E11.9).   blood glucose meter kit and supplies Kit Dispense based on patient and insurance preference. Use up to four times daily as directed. (FOR ICD-9 250.00, 250.01).   clonazePAM 1 MG tablet Commonly known as:  KLONOPIN Take 1 tablet (1 mg total) by mouth 2 (two) times daily.   insulin aspart 100 UNIT/ML FlexPen Commonly known as:  NOVOLOG Inject 12 Units into the skin 3 (three) times daily with meals.   Insulin Detemir 100 UNIT/ML Pen Commonly known as:  LEVEMIR Inject 14 Units into the skin 2 (two) times daily.   potassium chloride SA 20 MEQ tablet Commonly known as:  K-DUR,KLOR-CON Take 1 tablet (20 mEq total) by mouth daily for 7 days.   traZODone 150 MG tablet Commonly known as:  DESYREL Take 1 tablet (150 mg total) by mouth at bedtime.   trimethoprim-polymyxin b ophthalmic solution Commonly known as:  POLYTRIM Place 1 drop into the right eye every 6 (six) hours for 4 days. Use for 7 days for pinkeye What changed:  when to take this      Allergies  Allergen Reactions  . Eggs Or Egg-Derived Products Swelling    Lip swelling  Follow-up Information    Copland, Gay Filler, MD. Call in 1 day(s).   Specialty:  Family Medicine Why:  Please call for a post hospital follow-up appointment. Contact information: [place] [phone]            The results of significant diagnostics from this hospitalization (including imaging, microbiology, ancillary and laboratory) are listed below for reference.    Significant Diagnostic Studies: No results found.  Microbiology: Recent Results (from the past 240 hour(s))  MRSA PCR Screening      Status: None   Collection Time: 05/11/18  8:42 PM  Result Value Ref Range Status   MRSA by PCR NEGATIVE NEGATIVE Final    Comment:        The GeneXpert MRSA Assay (FDA approved for NASAL specimens only), is one component of a comprehensive MRSA colonization surveillance program. It is not intended to diagnose MRSA infection nor to guide or monitor treatment for MRSA infections. Performed at [facility], [place] [address], [place], [place] [address]      Labs: Basic Metabolic Panel: Recent Labs  Lab 05/12/18 0034 05/12/18 0512 05/12/18 1751  05/13/18 1132 05/13/18 1806 05/13/18 2215 05/14/18 0221 05/14/18 0523  NA 135 133* 132*   < > 133* 134* 136 137 138  K 3.4* 3.5 3.5   < > 3.3* 3.8 3.4* 3.3* 3.2*  CL 109 109 102   < > 100 101 101 99 101  CO2 13* 13* 14*   < > 19* '22 24 24 26  ' GLUCOSE 208* 177* 289*   < > 249* 318* 274* 268* 248*  BUN '9 8 10   ' < > '10 10 11 9 8  ' CREATININE 0.81 0.73 0.76   < > 0.72 0.71 0.72 0.65 0.57*  CALCIUM 8.0* 8.1* 8.5*   < > 8.7* 8.5* 8.5* 8.4* 8.4*  MG 1.9 2.0 2.1  --   --   --   --   --   --    < > = values in this interval not displayed.   Liver Function Tests: Recent Labs  Lab 05/11/18 1541  AST 39  ALT 129*  ALKPHOS 118  BILITOT 1.4*  PROT 8.2*  ALBUMIN 4.7   No results for input(s): LIPASE, AMYLASE in the last 168 hours. No results for input(s): AMMONIA in the last 168 hours. CBC: Recent Labs  Lab 05/11/18 1541 05/12/18 0512 05/13/18 0309 05/14/18 0523  WBC 10.2 7.4 6.9 5.2  NEUTROABS 8.1*  --   --   --   HGB 17.3* 14.6 14.2 13.7  HCT 51.3 42.6 41.4 40.2  MCV 89.4 88.0 88.8 87.2  PLT 293 215 207 198   Cardiac Enzymes: No results for input(s): CKTOTAL, CKMB, CKMBINDEX, TROPONINI in the last 168 hours. BNP: BNP (last 3 results) No results for input(s): BNP in the last 8760 hours.  ProBNP (last 3 results) No results for input(s): PROBNP in the last 8760 hours.  CBG: Recent Labs  Lab  05/13/18 1217 05/13/18 1659 05/13/18 2105 05/14/18 0730 05/14/18 1147  GLUCAP 250* 225* 288* 209* 245*       Signed:  Kayleen Memos, MD [facility] 05/14/2018, 12:17 PM

## 2018-05-14 NOTE — Plan of Care (Signed)
Discharge instructions reviewed with patient and parents, questions answered, verbalized understanding.  Patient ambulatory to main entrance of hospital to be taken home by parents.

## 2018-05-15 ENCOUNTER — Ambulatory Visit: Payer: Self-pay | Admitting: Family Medicine

## 2018-05-15 LAB — ANTI-ISLET CELL ANTIBODY: PANCREATIC ISLET CELL ANTIBODY: NEGATIVE

## 2018-05-16 ENCOUNTER — Encounter: Payer: Self-pay | Admitting: Dietician

## 2018-05-16 ENCOUNTER — Encounter: Payer: BLUE CROSS/BLUE SHIELD | Attending: Family Medicine | Admitting: Dietician

## 2018-05-16 VITALS — Ht 71.5 in | Wt 260.7 lb

## 2018-05-16 DIAGNOSIS — E119 Type 2 diabetes mellitus without complications: Secondary | ICD-10-CM | POA: Insufficient documentation

## 2018-05-16 NOTE — Patient Instructions (Signed)
   Continue checking your blood sugar levels daily, at least one fasting level (before a meal) and one 2 hours after a meal.   Begin working through the handouts from today, incorporating new meal/snack ideas into your diet.   Remember to spread carbohydrate intake throughout the day, and always try to eat protein foods with carbohydrate foods.

## 2018-05-16 NOTE — Progress Notes (Signed)
Diabetes Self-Management Education  Apt Start Time: 10:30am   Apt End Time: 11:30am  Primary Concerns Today: management for new diagnosis of type 2 diabetes    NUTRITION ASSESSMENT  Weight: 260.7 lbs  Height: 71.5 in  BMI: 35.9  HgbA1c: 12.1 (05/12/2018) Glucose: 235, 246, 178, 232 (05/15/2018 pt reported)  Medical Hx: T2DM (diagnosed last week on 05/11/2018)  Pt states he noticed excessive thirst and loss of appetite for a couple of months leading up to his hospitalization at which point he was diagnosed with diabetes. Pt states he lost about 30 pounds during those 2-3 months. Pt states he would like to lose about 20 more pounds, but while eating a healthy, appropriate diet. Pt states insulin is very expensive and it is his goal to manage his diabetes with diet and lifestyle. Pt is very motivated and has already made dietary changes since his diagnosis last week.   Dietary Recall (prior to diagnosis of diabetes)  First Meal: SEC sandwich + fries + sweet tea ([place] or Lindie Spruce)  Snack: none stated  Second Meal: Sub[employer] or [employer]  Snack: none stated  Third Meal: meat + rice + soda  Snack: none stated  Beverages: soda + smoothies + water + sweet tea Hx: large portions   Dietary Recall (since diagnosis of diabetes) First Meal: 2-3 eggs + mixed vegetables + rice or toast  Snack: none Second Meal: steak + salad + rice  Snack: pistachios Third Meal: tilapia + vegetables  Snack: peanut butter + crackers  Beverages: water + diet Coke (1/week) Hx: smaller portions, a lot less fast food, cut way back on added sugars   Estimated Energy Needs Calories: 2800 Carbohydrates: 315 g (~21 carb choices)  Protein: 210 g Fat: 78 g  Diabetes Self-Management Education - 05/16/18 1043      Visit Information   Visit Type  First/Initial      Initial Visit   Diabetes Type  Type 2    Are you currently following a meal plan?  Yes    What type of meal plan do you follow?  "vertical diet"      Are you taking your medications as prescribed?  Yes    Date Diagnosed  05/11/2018      Health Coping   How would you rate your overall health?  Fair      Psychosocial Assessment   Patient Belief/Attitude about Diabetes  Motivated to manage diabetes    Self-care barriers  None    Self-management support  Doctor's office;Family    Patient Concerns  Nutrition/Meal planning;Weight Control;Medication;Healthy Lifestyle;Glycemic Control    Special Needs  None    Preferred Learning Style  No preference indicated    Learning Readiness  Ready    How often do you need to have someone help you when you read instructions, pamphlets, or other written materials from your doctor or pharmacy?  2 - Rarely    What is the last grade level you completed in school?  12th      Pre-Education Assessment   Patient understands the diabetes disease and treatment process.  Needs Instruction    Patient understands incorporating nutritional management into lifestyle.  Needs Instruction    Patient undertands incorporating physical activity into lifestyle.  Needs Instruction    Patient understands using medications safely.  Needs Instruction    Patient understands monitoring blood glucose, interpreting and using results  Needs Instruction    Patient understands prevention, detection, and treatment of acute complications.  Needs Instruction  Patient understands prevention, detection, and treatment of chronic complications.  Needs Instruction    Patient understands how to develop strategies to address psychosocial issues.  Needs Instruction    Patient understands how to develop strategies to promote health/change behavior.  Needs Instruction      Complications   Last HgB A1C per patient/outside source  12.1 %    How often do you check your blood sugar?  3-4 times/day    Fasting Blood glucose range (mg/dL)  540-981;>191180-200;>200    Postprandial Blood glucose range (mg/dL)  478-295;>621180-200;>200    Number of hypoglycemic episodes per  month  0    Number of hyperglycemic episodes per week  --   new diagnosis, difficult to determine   Have you had a dilated eye exam in the past 12 months?  No    Have you had a dental exam in the past 12 months?  No    Are you checking your feet?  No      Dietary Intake   Breakfast  2 or 3 eggs + mixed vegetables + rice or toast    Snack (morning)  none    Lunch  steak + salad + rice    Snack (afternoon)  pistachios    Dinner  tilapia + vegetables     Snack (evening)  peanut butter + crackers    Beverage(s)  water + diet Coke (1/week)      Exercise   Exercise Type  ADL's;Strenuous (running);Moderate (swimming / aerobic walking);Other (comment)   HIIT exercises, weight lifting/ strength training   How many days per week to you exercise?  5    How many minutes per day do you exercise?  75    Total minutes per week of exercise  375      Patient Education   Previous Diabetes Education  No    Disease state   Definition of diabetes, type 1 and 2, and the diagnosis of diabetes;Explored patient's options for treatment of their diabetes    Nutrition management   Role of diet in the treatment of diabetes and the relationship between the three main macronutrients and blood glucose level;Carbohydrate counting;Meal options for control of blood glucose level and chronic complications.    Physical activity and exercise   Role of exercise on diabetes management, blood pressure control and cardiac health.;Helped patient identify appropriate exercises in relation to his/her diabetes, diabetes complications and other health issue.    Monitoring  Identified appropriate SMBG and/or A1C goals.;Taught/discussed recording of test results and interpretation of SMBG.    Acute complications  Taught treatment of hypoglycemia - the 15 rule.;Discussed and identified patients' treatment of hyperglycemia.    Personal strategies to promote health  Lifestyle issues that need to be addressed for better diabetes care       Post-Education Assessment   Patient understands the diabetes disease and treatment process.  Needs Review    Patient understands incorporating nutritional management into lifestyle.  Needs Review    Patient undertands incorporating physical activity into lifestyle.  Demonstrates understanding / competency    Patient understands using medications safely.  Needs Review    Patient understands monitoring blood glucose, interpreting and using results  Needs Review    Patient understands prevention, detection, and treatment of acute complications.  Needs Review    Patient understands prevention, detection, and treatment of chronic complications.  Needs Instruction    Patient understands how to develop strategies to address psychosocial issues.  Needs Instruction  Patient understands how to develop strategies to promote health/change behavior.  Needs Review      Outcomes   Expected Outcomes  Demonstrated interest in learning. Expect positive outcomes    Future DMSE  4-6 wks    Program Status  Not Completed       NUTRITION DIAGNOSIS  Altered nutrition-related laboratory values ([place]-2.2) related to type 2 diabetes as evidenced by high plasma glucose (235, 246, 178, 232) and HgbA1c (12.1%) levels, rapid weight changes (30 pound loss in 2 months), family history of type 2 diabetes, and pt reported poor dietary history.   NUTRITION INTERVENTION Nutrition education (E) and carbohydrate modified diet for consistent carbohydrate intake (ND-1.2.4.1). See Diabetes Self-Management Education notes above.    MONITORING & EVALUATION Dietary intake, physical activity, body weight, and blood glucose levels in 1 month.   RD's Notes for Follow Up Visit:   Meal timing with medications   Tips for eating out   Food label reading  Blood glucose goals pre and post meals (provide blank food log)   Effects of alcohol on BG levels (per pt request)   Reminder about daily foot exams, dilated eye exam, and  dental exam (provide Self-Care handout)   Pick up at "Chronic Complications" section of DSME Assessment   Next Steps:  Patient is to return to [facility] for diabetes follow up visit in ~1 month.

## 2018-05-18 ENCOUNTER — Encounter: Payer: Self-pay | Admitting: Family Medicine

## 2018-05-18 ENCOUNTER — Ambulatory Visit (INDEPENDENT_AMBULATORY_CARE_PROVIDER_SITE_OTHER): Payer: BLUE CROSS/BLUE SHIELD | Admitting: Family Medicine

## 2018-05-18 VITALS — BP 130/80 | HR 84 | Temp 98.4°F | Resp 16 | Ht 71.5 in | Wt 264.0 lb

## 2018-05-18 DIAGNOSIS — Z23 Encounter for immunization: Secondary | ICD-10-CM

## 2018-05-18 DIAGNOSIS — E1165 Type 2 diabetes mellitus with hyperglycemia: Secondary | ICD-10-CM

## 2018-05-18 DIAGNOSIS — IMO0001 Reserved for inherently not codable concepts without codable children: Secondary | ICD-10-CM

## 2018-05-18 DIAGNOSIS — R7989 Other specified abnormal findings of blood chemistry: Secondary | ICD-10-CM

## 2018-05-18 DIAGNOSIS — R945 Abnormal results of liver function studies: Secondary | ICD-10-CM | POA: Diagnosis not present

## 2018-05-18 DIAGNOSIS — F411 Generalized anxiety disorder: Secondary | ICD-10-CM

## 2018-05-18 LAB — COMPREHENSIVE METABOLIC PANEL
ALT: 154 U/L — ABNORMAL HIGH (ref 0–53)
AST: 61 U/L — AB (ref 0–37)
Albumin: 4.1 g/dL (ref 3.5–5.2)
Alkaline Phosphatase: 74 U/L (ref 39–117)
BUN: 13 mg/dL (ref 6–23)
CO2: 29 meq/L (ref 19–32)
CREATININE: 0.87 mg/dL (ref 0.40–1.50)
Calcium: 9.3 mg/dL (ref 8.4–10.5)
Chloride: 101 mEq/L (ref 96–112)
GFR: 102.62 mL/min (ref >60.00)
Glucose, Bld: 211 mg/dL — ABNORMAL HIGH (ref 70–99)
Potassium: 4.1 mEq/L (ref 3.5–5.1)
Sodium: 138 mEq/L (ref 135–145)
Total Bilirubin: 0.4 mg/dL (ref 0.2–1.2)
Total Protein: 6.4 g/dL (ref 6.0–8.3)

## 2018-05-18 MED ORDER — CLONAZEPAM 1 MG PO TABS: 1.0000 mg | ORAL_TABLET | Freq: Two times a day (BID) | ORAL | 3 refills | Status: DC

## 2018-05-18 NOTE — Addendum Note (Signed)
Addended by: Abbe Amsterdam C on: 05/18/2018 06:41 PM   Modules accepted: Orders

## 2018-05-18 NOTE — Patient Instructions (Signed)
It was good to see you today! Continue your diet changes I am going to refer you to see an endocrinologist- a diabetes specialist Please continue to check your blood sugars; check once first thing in the am while you are fasting and one other time during the day- continue to keep a log If you have any low blood sugars; under 75 or so- or if you have any other concerns please alert me!

## 2018-05-23 ENCOUNTER — Ambulatory Visit (HOSPITAL_BASED_OUTPATIENT_CLINIC_OR_DEPARTMENT_OTHER)
Admission: RE | Admit: 2018-05-23 | Discharge: 2018-05-23 | Disposition: A | Discharge status: Home / Self Care | Payer: BLUE CROSS/BLUE SHIELD | Source: Ambulatory Visit | Attending: Family Medicine | Admitting: Family Medicine

## 2018-05-23 ENCOUNTER — Encounter: Payer: Self-pay | Admitting: Family Medicine

## 2018-05-23 DIAGNOSIS — R7989 Other specified abnormal findings of blood chemistry: Secondary | ICD-10-CM | POA: Insufficient documentation

## 2018-05-23 DIAGNOSIS — R945 Abnormal results of liver function studies: Secondary | ICD-10-CM | POA: Insufficient documentation

## 2018-06-02 ENCOUNTER — Encounter: Payer: Self-pay | Admitting: Family Medicine

## 2018-06-11 NOTE — Progress Notes (Signed)
[facility] [address], [place], [place] [address] [phone]  Date:  06/14/2018   Name:  Perry Li   DOB:  [birth year]   MRN:  [id number]  PCP:  Darreld Mclean, MD    Chief Complaint: Diabetes (follow up) and Anxiety (medication management, increase clonazepam and decreasing trazadone)   History of Present Illness:  Perry Li is a 33 y.o. very pleasant male patient who presents with the following:  Here today for short-term follow-up visit He presented about 1 month ago with new onset diabetes in DKA, and was admitted to the hospital He was released to home with Levemir and mealtime NovoLog I saw him on January 16 for hospital follow-up, and we referred him to endocrinology-he has an appoint with them later this week  He has been in contact with me regarding his anxiety, and wanted to increase his clonazepam.  He is also taking trazodone at bedtime.  I asked him to come and see me as we discussed the plan-perhaps come off trazodone and start on an SSRI or SNRI, which might be more helpful in controlling anxiety  He has changed his diet a lot-  Higher protein, lower carbs He is seeing endocrinology this week He is off all his diabetes meds, as his glucose has been running 120- 130 on a consistent basis Highest glucose about 160 in the last week or so  He was drinking sodas/ juice before but is now just drinking water  He has been on trazodone at bedtime for about 2.5 years He went on this for insomnia He does feel like his sleep is pretty good  He is taking a 1/2 tablet generally at bedtime -about 75 mg  He is also on klonopin- supposed to be on this BID, but has been sometimes taking TID for the last 4-5 months He is not sure what made his anxiety worse; he is not feeling less anxious now that his DM is under better control unfortunately  He does not feel like he has depression His  anxiety can manifest as his neck feeling tight, feeling nervous being around people- he may have to take a klonopin prior to going to a social environment such as the gym  Lab Results  Component Value Date   HGBA1C 12.1 (H) 05/12/2018   05/31/2018  1   05/18/2018  Clonazepam 1 MG Tablet  60.00 30 Je Cop   [id number]   Wal ([id number])   0  4.00 LME  Comm Ins   [place]  05/06/2018  1   01/05/2018  Clonazepam 1 MG Tablet  60.00 30 Je Cop   [id number]   Wal ([id number])   0  4.00 LME  Comm Ins   [place]  04/07/2018  1   04/07/2018  Clonazepam 1 MG Tablet  60.00 30 Je Cop   [id number]   Wal ([id number])   0  4.00 LME  Comm Ins   [place]  02/10/2018  1   01/05/2018  Clonazepam 1 MG Tablet  60.00 30 Je Cop   [id number]   Wal ([id number])   1  4.00 LME  Comm Ins   [place]  01/13/2018  1   01/05/2018  Clonazepam 1 MG Tablet  60.00 30 Je Cop   [id number]   Wal ([id number])   0  4.00 LME  Comm Ins   [place]  12/19/2017  1   09/21/2017  Clonazepam 1 MG Tablet  60.00 30 Je Cop   [id number]   Wal [phone])   3  4.00 LME  Comm Ins   [place]  11/18/2017  1   09/21/2017  Clonazepam 1 MG Tablet  60.00 30 Je Cop   [id number]   Wal ([id number])   2  4.00 LME  Comm Ins   [place]  10/24/2017  1   09/21/2017  Clonazepam 1 MG Tablet  60.00 30 Je Cop   [id number]   Wal ([id number])   1  4.00 LME  Comm Ins   [place]  09/27/2017  1   09/21/2017  Clonazepam 1 MG Tablet  60.00 30 Je Cop   [id number]   Wal ([id number])   0  4.00 LME       Patient Active Problem List   Diagnosis Date Noted  . Elevated LFTs 05/23/2018  . Hyperglycemia 05/12/2018  . DKA (diabetic ketoacidoses) ([place]) 05/11/2018  . Right Achilles tendinitis 08/15/2014  . High cholesterol   . Anxiety   . Insomnia     Past Medical History:  Diagnosis Date  . Anxiety   . Chest pain   . High cholesterol   . Insomnia   . SOB (shortness of breath)     Past Surgical History:  Procedure Laterality Date  . NO PAST SURGERIES      Social History   Tobacco Use  . Smoking status: Never Smoker  . Smokeless tobacco: Never Used  Substance Use Topics  . Alcohol use: Yes     Alcohol/week: 0.0 standard drinks    Comment: occasional  . Drug use: No    History reviewed. No pertinent family history.  No Active Allergies  Medication list has been reviewed and updated.  Current Outpatient Medications on File Prior to Visit  Medication Sig Dispense Refill  . blood glucose meter kit and supplies KIT Dispense based on patient and insurance preference. Use up to four times daily as directed. (FOR ICD-9 250.00, 250.01). 1 each 0  . blood glucose meter kit and supplies Dispense based on patient and insurance preference. Use up to twice daily as directed. (FOR ICD-10 E10.9, E11.9). 1 each 0  . insulin aspart (NOVOLOG) 100 UNIT/ML FlexPen Inject 12 Units into the skin 3 (three) times daily with meals. (Patient not taking: Reported on 06/14/2018) 15 mL 0  . Insulin Detemir (LEVEMIR) 100 UNIT/ML Pen Inject 14 Units into the skin 2 (two) times daily. (Patient not taking: Reported on 06/14/2018) 15 mL 0  . potassium chloride SA (K-DUR,KLOR-CON) 20 MEQ tablet Take 1 tablet (20 mEq total) by mouth daily for 7 days. 7 tablet 0   No current facility-administered medications on file prior to visit.     Review of Systems:  As per HPI- otherwise negative. No fever or chills  Physical Examination: Vitals:   06/14/18 1412  BP: 132/84  Pulse: 79  Resp: 16  Temp: 98.2 F (36.8 C)  SpO2: 97%   Vitals:   06/14/18 1412  Weight: 261 lb (118.4 kg)  Height: 5' 11.5" (1.816 m)   Body mass index is 35.89 kg/m. Ideal Body Weight: Weight in (lb) to have BMI = 25: 181.4  GEN: WDWN, NAD, Non-toxic, A & O x 3, obese, looks well HEENT: Atraumatic, Normocephalic. Neck supple. No masses, No LAD. Ears and Nose: No external deformity. CV: RRR, No M/G/R. No JVD. No thrill. No extra heart sounds. PULM: CTA B, no wheezes, crackles, rhonchi. No retractions. No resp. distress. No accessory muscle use. EXTR: No  c/c/e NEURO Normal gait.  PSYCH: Normally interactive. Conversant. Not  depressed or anxious appearing.  Calm demeanor.   Results for orders placed or performed in visit on 06/14/18  POCT glucose (manual entry)  Result Value Ref Range   POC Glucose 123 (A) 70 - 99 mg/dl   Wt Readings from Last 3 Encounters:  06/14/18 261 lb (118.4 kg)  05/18/18 264 lb (119.7 kg)  05/16/18 260 lb 11.2 oz (118.3 kg)    Assessment and Plan: Uncontrolled diabetes mellitus type 2 without complications ([place]) - Plan: POCT glucose (manual entry)  Primary insomnia - Plan: traZODone (DESYREL) 50 MG tablet  GAD (generalized anxiety disorder) - Plan: clonazePAM (KLONOPIN) 1 MG tablet, venlafaxine XR (EFFEXOR-XR) 75 MG 24 hr capsule  Patient is here today to follow-up on his diabetes.  He is actually stopped all of his insulin, but reports that his sugar has been under 150.  His sugar is normal here today.  He is seen endocrinology in 2 days, so will defer further treatment to their expertise  He is also suffering from anxiety, currently he is taking clonazepam 1 mg twice a day.  He has really want to increase this dose and has asked me about this a few times.  He is also taking trazodone, which limits my ability to use other anxiety medications due to interaction risk.  We have decided on a plan to temporarily increase his clonazepam while we taper off of trazodone.  I would then like to have him start on Effexor, and decrease clonazepam  We have discussed risk of sedation and dependence with using benzodiazepines I have asked him to keep me closely apprised as to how he is doing over my chart will make these medication adjustments See patient instructions below: It was good to see you today, I will watch for the note from your endocrinology visit later this week I refilled your clonazepam 1 mg, you may take this 3 times a day as needed.  This should be a temporary measure, while we change you from trazodone to something else.  Take the clonazepam as little as possible, take only twice a  day when you can  Let us taper you off of the trazodone.  Take 50 mg at bedtime for 7 to 10 days, then decrease to 25 mg for 7 to 10 days. After this, stop taking the trazodone and start on the venlafaxine 75 mg. I am hoping venlafaxine will be more effective for your anxiety  Please keep me closely updated on how you are doing while we make these medication changes We will plan to decrease the clonazepam back to twice a day once we get you stable on venlafaxine  Signed Lamar Blinks, MD  Meds ordered this encounter  Medications  . traZODone (DESYREL) 50 MG tablet    Sig: Take 1 tablet (50 mg total) by mouth at bedtime. Take 50 mg for 7- 10 days, then take 25 mg for 7- 10 days, then stop    Dispense:  30 tablet    Refill:  0  . clonazePAM (KLONOPIN) 1 MG tablet    Sig: Take 1 tablet (1 mg total) by mouth 3 (three) times daily as needed for anxiety.    Dispense:  90 tablet    Refill:  0  . venlafaxine XR (EFFEXOR-XR) 75 MG 24 hr capsule    Sig: Take 1 capsule (75 mg total) by mouth daily with breakfast.    Dispense:  30 capsule  Refill:  6

## 2018-06-14 ENCOUNTER — Ambulatory Visit (INDEPENDENT_AMBULATORY_CARE_PROVIDER_SITE_OTHER): Payer: BLUE CROSS/BLUE SHIELD | Admitting: Family Medicine

## 2018-06-14 ENCOUNTER — Encounter: Payer: Self-pay | Admitting: Family Medicine

## 2018-06-14 VITALS — BP 132/84 | HR 79 | Temp 98.2°F | Resp 16 | Ht 71.5 in | Wt 261.0 lb

## 2018-06-14 DIAGNOSIS — F411 Generalized anxiety disorder: Secondary | ICD-10-CM | POA: Diagnosis not present

## 2018-06-14 DIAGNOSIS — E1165 Type 2 diabetes mellitus with hyperglycemia: Secondary | ICD-10-CM

## 2018-06-14 DIAGNOSIS — F5101 Primary insomnia: Secondary | ICD-10-CM

## 2018-06-14 DIAGNOSIS — IMO0001 Reserved for inherently not codable concepts without codable children: Secondary | ICD-10-CM

## 2018-06-14 LAB — GLUCOSE, POCT (MANUAL RESULT ENTRY): POC GLUCOSE: 123 mg/dL — AB (ref 70–99)

## 2018-06-14 MED ORDER — TRAZODONE HCL 50 MG PO TABS: 50.0000 mg | ORAL_TABLET | Freq: Every day | ORAL | 0 refills | Status: DC

## 2018-06-14 MED ORDER — CLONAZEPAM 1 MG PO TABS: 1.0000 mg | ORAL_TABLET | Freq: Three times a day (TID) | ORAL | 0 refills | Status: DC | PRN

## 2018-06-14 MED ORDER — VENLAFAXINE HCL ER 75 MG PO CP24: 75.0000 mg | ORAL_CAPSULE | Freq: Every day | ORAL | 6 refills | Status: DC

## 2018-06-14 NOTE — Patient Instructions (Signed)
It was good to see you today, I will watch for the note from your endocrinology visit later this week I refilled your clonazepam 1 mg, you may take this 3 times a day as needed.  This should be a temporary measure, while we change you from trazodone to something else.  Take the clonazepam as little as possible, take only twice a day when you can  Let us taper you off of the trazodone.  Take 50 mg at bedtime for 7 to 10 days, then decrease to 25 mg for 7 to 10 days. After this, stop taking the trazodone and start on the venlafaxine 75 mg. I am hoping venlafaxine will be more effective for your anxiety  Please keep me closely updated on how you are doing while we make these medication changes We will plan to decrease the clonazepam back to twice a day once we get you stable on venlafaxine

## 2018-06-15 ENCOUNTER — Ambulatory Visit: Payer: BLUE CROSS/BLUE SHIELD | Admitting: Dietician

## 2018-06-16 ENCOUNTER — Ambulatory Visit: Payer: BLUE CROSS/BLUE SHIELD | Admitting: Internal Medicine

## 2018-06-16 DIAGNOSIS — Z0289 Encounter for other administrative examinations: Secondary | ICD-10-CM

## 2018-06-21 ENCOUNTER — Encounter: Payer: Self-pay | Admitting: Internal Medicine

## 2018-06-21 ENCOUNTER — Ambulatory Visit (INDEPENDENT_AMBULATORY_CARE_PROVIDER_SITE_OTHER): Payer: BLUE CROSS/BLUE SHIELD | Admitting: Internal Medicine

## 2018-06-21 VITALS — BP 118/72 | HR 76 | Resp 18 | Ht 71.5 in | Wt 268.6 lb

## 2018-06-21 DIAGNOSIS — E109 Type 1 diabetes mellitus without complications: Secondary | ICD-10-CM | POA: Diagnosis not present

## 2018-06-21 MED ORDER — METFORMIN HCL ER 500 MG PO TB24: 500.0000 mg | ORAL_TABLET | Freq: Every day | ORAL | 6 refills | Status: DC

## 2018-06-21 NOTE — Progress Notes (Signed)
Name: Perry Li  MRN/ DOB: [id number], 1986-03-22   Age/ Sex: 33 y.o., male    PCP: Copland, Gay Filler, MD   Reason for Endocrinology Evaluation: Ketosis- Prone Diabetes Mellitus     Date of Initial Endocrinology Visit: 06/21/2018     PATIENT IDENTIFIER: Perry Li is a 33 y.o. male with a past medical history of Anxiety. The patient presented for initial endocrinology clinic visit on 06/21/2018 for consultative assistance with his diabetes management.    HPI: Perry Li was   Diagnosed with DM in 05/2018, he initially presented to his PCP with c/o weight loss and polydipsia, found to have BG's in 400 mg/dL. From there he was sent to the ED and was found to have DKA. He was discharge don Levemir and novolog, that he took for 2 weeks post-discharge Prior Medications tried/Intolerance: N/A He was discharged from the hospital on MDI regimen .  Currently checking blood sugars 2-3 x / day,  before breakfast and evening  Hypoglycemia episodes : No                Hemoglobin A1c has ranged from 5.7% in 2017, peaking at 12.1% in 2020. Patient required assistance for hypoglycemia: no Patient has required hospitalization within the last 1 year from hyper or hypoglycemia: Yes- 05/2018 DKA   In terms of diet, the patient is on a low carb diet    HOME DIABETES REGIMEN: N/A   Statin: No ACE-I/ARB: No Prior Diabetic Education: Yes   METER DOWNLOAD SUMMARY: Date range evaluated: 1/21-2/19/20 Fingerstick Blood Glucose Tests = 49 Average Number Tests/Day = 1.6 Overall Mean FS Glucose = 141 Standard Deviation = 28.3  BG Ranges: Low = 85 High = 209   Hypoglycemic Events/30 Days: BG < 50 = 0 Episodes of symptomatic severe hypoglycemia = 0   DIABETIC COMPLICATIONS: Microvascular complications:    Denies: CKD, neuropathy, retinopathy  Last eye exam: Completed 2017  Macrovascular complications:    Denies: CAD, PVD, CVA   PAST HISTORY: Past Medical  History:  Past Medical History:  Diagnosis Date  . Anxiety   . Chest pain   . High cholesterol   . Insomnia   . SOB (shortness of breath)    Past Surgical History:  Past Surgical History:  Procedure Laterality Date  . NO PAST SURGERIES        Social History:  reports that he has never smoked. He has never used smokeless tobacco. He reports current alcohol use. He reports that he does not use drugs. Family History:  Family History  Problem Relation Age of Onset  . Healthy Mother   . Hypertension Father   . Diabetes Maternal Grandmother      HOME MEDICATIONS: Allergies as of 06/21/2018   No Known Allergies     Medication List       Accurate as of June 21, 2018  9:04 AM. Always use your most recent med list.        blood glucose meter kit and supplies Dispense based on patient and insurance preference. Use up to twice daily as directed. (FOR ICD-10 E10.9, E11.9).   blood glucose meter kit and supplies Kit Dispense based on patient and insurance preference. Use up to four times daily as directed. (FOR ICD-9 250.00, 250.01).   clonazePAM 1 MG tablet Commonly known as:  KLONOPIN Take 1 tablet (1 mg total) by mouth 3 (three) times daily as needed for anxiety.   traZODone 50 MG tablet Commonly known as:  DESYREL Take 1 tablet (50 mg total) by mouth at bedtime. Take 50 mg for 7- 10 days, then take 25 mg for 7- 10 days, then stop   venlafaxine XR 75 MG 24 hr capsule Commonly known as:  EFFEXOR-XR Take 1 capsule (75 mg total) by mouth daily with breakfast.        ALLERGIES: No Known Allergies   REVIEW OF SYSTEMS: A comprehensive ROS was conducted with the patient and is negative except as per HPI and below:  Review of Systems  Constitutional: Positive for weight loss. Negative for fever.  HENT: Positive for congestion. Negative for sore throat.   Eyes: Negative for blurred vision and discharge.  Respiratory: Negative for cough and shortness of breath.     Cardiovascular: Negative for chest pain and palpitations.  Gastrointestinal: Negative for diarrhea and nausea.  Genitourinary: Negative for frequency.  Endo/Heme/Allergies: Negative for polydipsia.      OBJECTIVE:   VITAL SIGNS: BP 118/72 (BP Location: Right Arm, Patient Position: Sitting, Cuff Size: Large)   Pulse 76   Resp 18   Ht 5' 11.5" (1.816 m)   Wt 268 lb 9.6 oz (121.8 kg)   SpO2 97%   BMI 36.94 kg/m    PHYSICAL EXAM:  General: Pt appears well and is in NAD  Hydration: Well-hydrated with moist mucous membranes and good skin turgor  HEENT: Head: Unremarkable with good dentition. Oropharynx clear without exudate.  Eyes: External eye exam normal without stare, lid lag or exophthalmos.  EOM intact.  PERRL.  Neck: General: Supple without adenopathy or carotid bruits. Thyroid: Thyroid size normal.  No goiter or nodules appreciated. No thyroid bruit.  Lungs: Clear with good BS bilat with no rales, rhonchi, or wheezes  Heart: RRR with normal S1 and S2 and no gallops; no murmurs; no rub  Abdomen: Normoactive bowel sounds, soft, nontender, without masses or organomegaly palpable  Extremities:  Lower extremities - No pretibial edema. No lesions.  Skin: Normal texture and temperature to palpation. No rash noted. No Acanthosis nigricans/skin tags. No lipohypertrophy.  Neuro: MS is good with appropriate affect, pt is alert and Ox3    DM foot exam: 06/21/18  The skin of the feet is intact without sores or ulcerations.Pt with medial callous formation at the left great toe The pedal pulses are 2+ on right and 2+ on left. The sensation is intact to a screening 5.07, 10 gram monofilament bilaterally  DATA REVIEWED:  Lab Results  Component Value Date   HGBA1C 12.1 (H) 05/12/2018   HGBA1C 5.7 10/09/2015   Lab Results  Component Value Date   CREATININE 0.87 05/18/2018    Lab Results  Component Value Date   CHOL 165 10/09/2015   HDL 38.30 (L) 10/09/2015   LDLDIRECT 110.0  10/09/2015   TRIG 201.0 (H) 10/09/2015   CHOLHDL 4 10/09/2015        ASSESSMENT / PLAN / RECOMMENDATIONS:   1) Newly Diagnosed Ketosis-Prone Diabetes Mellitus, Without complications - Most recent A1c of 12.1 %. Goal A1c < 7.0 %.   Plan: GENERAL: I have discussed with the patient the pathophysiology of diabetes. We went over the natural progression of the disease. We talked about both insulin resistance and insulin deficiency. We stressed the importance of lifestyle changes including diet and exercise. I explained the complications associated with diabetes including retinopathy, nephropathy, neuropathy as well as increased risk of cardiovascular disease. We went over the benefit seen with glycemic control.    I explained to the patient  that diabetic patients are at higher than normal risk for amputations. The patient was informed that diabetes is the number one cause of non-traumatic amputations in [place].    Praised the patient on lifestyle changes, and I have encouraged him to continue her low-carb diet and regular exercise.  We have discussed that ketosis-prone diabetes is more prevalent in African-Americans and Hispanics, a lot of people are able to get off medications but usually within 10 years they may end up being on insulin regimen again.  He is motivated to prevent this from happening again.  In review of his glucose download patient tends to have sugars in the high 100s, between 160s and 190s at times milligrams per deciliter, I have encouraged him to restart metformin at least 1 tablet daily.  I have cautioned him against GI side effects such as nausea and diarrhea, he was encouraged to call us with any concerns.  MEDICATIONS:  Restart metformin 500 mg XR once daily with breakfast  EDUCATION / INSTRUCTIONS:  BG monitoring instructions: Patient is instructed to check his blood sugars 2 times a day, fasting and bedtime.  Call [facility] if: BG persistently <  70 or > 300. . I reviewed the Rule of 15 for the treatment of hypoglycemia in detail with the patient. Literature supplied.   2) Diabetic complications:   Eye: Unknown have known diabetic retinopathy.  He was strongly encouraged to see an ophthalmologist ASAP  Neuro/ Feet: Does not have known diabetic peripheral neuropathy.  Renal: Patient does not have known baseline CKD. He is not on an ACEI/ARB at present.  He will need annual urine microalbumin/creatinine ratio, if abnormal will need to be started either on an ACEI/ARB I will defer this to his PCP.    3) Lipids: Patient is on a statin. Would recommend lifestyle changes at this time. Consider statin initiation at age 44 based on ASCVD risk calculation.     Follow-up in 2 months    Signed electronically by: Mack Guise, MD  [facility] End[facility]  [facility] [address] [place], [place] [address] Phone: [phone] FAX: [phone]   CC: Darreld Mclean, [address] [place] [address] Phone: [phone]  Fax: [phone]    Return to Endocrinology clinic as below: No future appointments.

## 2018-06-21 NOTE — Patient Instructions (Signed)
-   Please start Metformin 500 mg XR once a day with breakfast  - Continue checking sugar twice a day (fasting and bedtime) - See an eye doctor as soon as possible.

## 2018-06-26 ENCOUNTER — Other Ambulatory Visit: Payer: Self-pay | Admitting: Family Medicine

## 2018-06-26 DIAGNOSIS — F411 Generalized anxiety disorder: Secondary | ICD-10-CM

## 2018-06-28 NOTE — Telephone Encounter (Signed)
Seen in the office 2 weeks ago It looks like he just refilled his clonazepam on the 13th.  Too early for refill.  I can send over prescription with pharmacy instructions to fill when due

## 2018-06-29 LAB — BLOOD GAS, VENOUS

## 2018-07-10 ENCOUNTER — Encounter: Payer: Self-pay | Admitting: Family Medicine

## 2018-08-01 ENCOUNTER — Other Ambulatory Visit: Payer: Self-pay | Admitting: Family Medicine

## 2018-08-01 DIAGNOSIS — F411 Generalized anxiety disorder: Secondary | ICD-10-CM

## 2018-08-01 MED ORDER — CLONAZEPAM 1 MG PO TABS: 1.0000 mg | ORAL_TABLET | Freq: Three times a day (TID) | ORAL | 2 refills | Status: DC | PRN

## 2018-08-05 ENCOUNTER — Other Ambulatory Visit: Payer: Self-pay | Admitting: Family Medicine

## 2018-08-05 DIAGNOSIS — F411 Generalized anxiety disorder: Secondary | ICD-10-CM

## 2018-08-07 ENCOUNTER — Encounter: Payer: Self-pay | Admitting: Family Medicine

## 2018-08-21 ENCOUNTER — Ambulatory Visit: Payer: BLUE CROSS/BLUE SHIELD | Admitting: Internal Medicine

## 2018-08-31 ENCOUNTER — Encounter: Payer: BLUE CROSS/BLUE SHIELD | Admitting: Internal Medicine

## 2018-08-31 ENCOUNTER — Other Ambulatory Visit: Payer: Self-pay

## 2018-08-31 NOTE — Progress Notes (Deleted)
Virtual Visit via Video Note  I connected with Perry Li on 08/31/18 at  9:50 AM EDT by a video enabled telemedicine application and verified that I am speaking with the correct person using two identifiers.   I discussed the limitations of evaluation and management by telemedicine and the availability of in person appointments. The patient expressed understanding and agreed to proceed.   -Location of the patient : -Location of the provider : Office -The names of all persons participating in the telemedicine service : Pt and myself        Name: Perry Li  Age/ Sex: 33 y.o., male   MRN/ DOB: [id number], [birth year]     PCP: Darreld Mclean, MD   Reason for Endocrinology Evaluation: Ketosis Prone  Diabetes Mellitus     Initial Endocrinology Clinic Visit:  08/31/2018    PATIENT IDENTIFIER: Perry Li is a 33 y.o. male with a past medical history of Ketosis Prone Diabetes and Anxiety . The patient has followed with Endocrinology clinic since 06/21/2018 for consultative assistance with management of his diabetes.  DIABETIC HISTORY:  Perry Li was diagnosed with ketosis prone diabetes in 05/2018 when he was admitted for DKA. He was discharged on Novolog and Levemir at the time. His hemoglobin A1c has ranged from 5.7% in 2017, peaking at 12.1% in 2020.   On his initial visit to our clinic, his A1c was 12.1 % . He had taken himself off insulin. We started Metformin   SUBJECTIVE:   During the last visit (06/21/2018): He has been off insulin for a few weeks, we started Metformin  Today (08/31/2018): Perry Li is here for a virtual 2 month follow up visit on his diabetes.  He checks his blood sugars *** times daily, preprandial to breakfast and ***. The patient has *** had hypoglycemic episodes since the last clinic visit, which typically occur *** x / - most often occuring ***. The patient is *** symptomatic with these episodes, with symptoms of {symptoms;  hypoglycemia:9084048}. Otherwise, the patient has not required any recent emergency interventions for hypoglycemia and has not had recent hospitalizations secondary to hyper or hypoglycemic episodes.    ROS: As per HPI and as detailed below: ROS    HOME DIABETES REGIMEN:  Metformin 500 mg XR 1 tablet with breakfast    GLUCOSE LOG:      HISTORY:  Past Medical History:  Past Medical History:  Diagnosis Date  . Anxiety   . Chest pain   . High cholesterol   . Insomnia   . SOB (shortness of breath)     Past Surgical History:  Past Surgical History:  Procedure Laterality Date  . NO PAST SURGERIES       Social History:  reports that he has never smoked. He has never used smokeless tobacco. He reports current alcohol use. He reports that he does not use drugs. Family History:  Family History  Problem Relation Age of Onset  . Healthy Mother   . Hypertension Father   . Diabetes Maternal Grandmother       HOME MEDICATIONS: Allergies as of 08/31/2018   No Known Allergies     Medication List       Accurate as of August 31, 2018  8:06 AM. Always use your most recent med list.        blood glucose meter kit and supplies Dispense based on patient and insurance preference. Use up to twice daily as directed. (FOR ICD-10 E10.9, E11.9).   blood  glucose meter kit and supplies Kit Dispense based on patient and insurance preference. Use up to four times daily as directed. (FOR ICD-9 250.00, 250.01).   clonazePAM 1 MG tablet Commonly known as:  KLONOPIN Take 1 tablet (1 mg total) by mouth 3 (three) times daily as needed for anxiety.   metFORMIN 500 MG 24 hr tablet Commonly known as:  Glucophage XR Take 1 tablet (500 mg total) by mouth daily with breakfast.   traZODone 50 MG tablet Commonly known as:  DESYREL Take 1 tablet (50 mg total) by mouth at bedtime. Take 50 mg for 7- 10 days, then take 25 mg for 7- 10 days, then stop   venlafaxine XR 75 MG 24 hr capsule  Commonly known as:  EFFEXOR-XR Take 1 capsule (75 mg total) by mouth daily with breakfast.         DATA REVIEWED:  Lab Results  Component Value Date   HGBA1C 12.1 (H) 05/12/2018   HGBA1C 5.7 10/09/2015   Lab Results  Component Value Date   CREATININE 0.87 05/18/2018   No results found for: [facility]   Lab Results  Component Value Date   CHOL 165 10/09/2015   HDL 38.30 (L) 10/09/2015   LDLDIRECT 110.0 10/09/2015   TRIG 201.0 (H) 10/09/2015   CHOLHDL 4 10/09/2015         ASSESSMENT / PLAN / RECOMMENDATIONS:   1) Newly Diagnosed Ketosis- Prone Diabetes Mellitus, Poorly controlled, With out complications - Most recent A1c of 12.1 %. Goal A1c < 7.0 %.    Plan: MEDICATIONS:  ***  EDUCATION / INSTRUCTIONS:  BG monitoring instructions: Patient is instructed to check his blood sugars *** times a day, ***.  Call [facility] if: BG persistently < 70 or > 300. . I reviewed the Rule of 15 for the treatment of hypoglycemia in detail with the patient. Literature supplied.    I discussed the assessment and treatment plan with the patient. The patient was provided an opportunity to ask questions and all were answered. The patient agreed with the plan and demonstrated an understanding of the instructions.   The patient was advised to call back or seek an in-person evaluation if the symptoms worsen or if the condition fails to improve as anticipated.  I provided *** minutes of non-face-to-face time during this encounter.   F/U in ***    Signed electronically by: Mack Guise, MD  [facility] Endocrinology  [facility] [address] [place], [place] [address] Phone: [phone] FAX: [phone]   CC: Darreld Mclean, [address] [place] [address] Phone: [phone]  Fax: [phone]  Return to Endocrinology clinic as below: Future Appointments  Date Time Provider [place]  08/31/2018  9:50 AM Shamleffer, Melanie Crazier, MD [facility] None

## 2018-09-01 ENCOUNTER — Ambulatory Visit (INDEPENDENT_AMBULATORY_CARE_PROVIDER_SITE_OTHER): Payer: BLUE CROSS/BLUE SHIELD | Admitting: Internal Medicine

## 2018-09-01 ENCOUNTER — Encounter: Payer: Self-pay | Admitting: Internal Medicine

## 2018-09-01 ENCOUNTER — Other Ambulatory Visit: Payer: Self-pay

## 2018-09-01 DIAGNOSIS — E109 Type 1 diabetes mellitus without complications: Secondary | ICD-10-CM | POA: Diagnosis not present

## 2018-09-01 NOTE — Progress Notes (Signed)
Virtual Visit via Video Note  I connected with Perry Li on 09/01/18 at  9:50 AM EDT by a video enabled telemedicine application and verified that I am speaking with the correct person using two identifiers.   I discussed the limitations of evaluation and management by telemedicine and the availability of in person appointments. The patient expressed understanding and agreed to proceed.   -Location of the patient : Home -Location of the provider : Office -The names of all persons participating in the telemedicine service : Pt and myself        Name: Perry Li  Age/ Sex: 33 y.o., male   MRN/ DOB: [id number], 03/30/1986     PCP: Darreld Mclean, MD   Reason for Endocrinology Evaluation: Ketosis Prone  Diabetes Mellitus     Initial Endocrinology Clinic Visit:  09/01/2018    PATIENT IDENTIFIER: Perry Li is a 33 y.o. male with a past medical history of Ketosis Prone Diabetes and Anxiety . The patient has followed with Endocrinology clinic since 06/21/2018 for consultative assistance with management of his diabetes.  DIABETIC HISTORY:  Perry Li was diagnosed with ketosis prone diabetes in 05/2018 when he was admitted for DKA. He was discharged on Novolog and Levemir at the time. His hemoglobin A1c has ranged from 5.7% in 2017, peaking at 12.1% in 2020.   On his initial visit to our clinic, his A1c was 12.1 % . He had taken himself off insulin. We started Metformin   SUBJECTIVE:   During the last visit (06/21/2018): He has been off insulin for a few weeks, we started Metformin  Today (09/01/2018): Perry Li is here for a virtual 2 month follow up visit on his diabetes.  He checks his blood sugars 1x daily. He has not had any hypoglycemic episodes. Otherwise, the patient has not required any recent emergency interventions for hypoglycemia and has not had recent hospitalizations secondary to hyper or hypoglycemic episodes.    ROS: As per HPI and as  detailed below: Review of Systems  HENT: Positive for congestion. Negative for sore throat.   Respiratory: Negative for cough and shortness of breath.   Cardiovascular: Negative for chest pain and palpitations.  Gastrointestinal: Negative for diarrhea and nausea.      HOME DIABETES REGIMEN:  Metformin 500 mg XR 1 tablet with breakfast    GLUCOSE LOG:  08/31/18    Fasting 95 mg/dL  4/29         Dinner 125 mg/dL        HISTORY:  Past Medical History:  Past Medical History:  Diagnosis Date  . Anxiety   . Chest pain   . High cholesterol   . Insomnia   . SOB (shortness of breath)    Past Surgical History:  Past Surgical History:  Procedure Laterality Date  . NO PAST SURGERIES      Social History:  reports that he has never smoked. He has never used smokeless tobacco. He reports current alcohol use. He reports that he does not use drugs. Family History:  Family History  Problem Relation Age of Onset  . Healthy Mother   . Hypertension Father   . Diabetes Maternal Grandmother      HOME MEDICATIONS: Allergies as of 09/01/2018   No Known Allergies     Medication List       Accurate as of Sep 01, 2018  7:39 AM. Always use your most recent med list.        blood glucose  meter kit and supplies Dispense based on patient and insurance preference. Use up to twice daily as directed. (FOR ICD-10 E10.9, E11.9).   blood glucose meter kit and supplies Kit Dispense based on patient and insurance preference. Use up to four times daily as directed. (FOR ICD-9 250.00, 250.01).   clonazePAM 1 MG tablet Commonly known as:  KLONOPIN Take 1 tablet (1 mg total) by mouth 3 (three) times daily as needed for anxiety.   metFORMIN 500 MG 24 hr tablet Commonly known as:  Glucophage XR Take 1 tablet (500 mg total) by mouth daily with breakfast.   traZODone 50 MG tablet Commonly known as:  DESYREL Take 1 tablet (50 mg total) by mouth at bedtime. Take 50 mg for 7- 10 days, then take 25  mg for 7- 10 days, then stop   venlafaxine XR 75 MG 24 hr capsule Commonly known as:  EFFEXOR-XR Take 1 capsule (75 mg total) by mouth daily with breakfast.         DATA REVIEWED:  Lab Results  Component Value Date   HGBA1C 12.1 (H) 05/12/2018   HGBA1C 5.7 10/09/2015   Lab Results  Component Value Date   CREATININE 0.87 05/18/2018   No results found for: [facility]   Lab Results  Component Value Date   CHOL 165 10/09/2015   HDL 38.30 (L) 10/09/2015   LDLDIRECT 110.0 10/09/2015   TRIG 201.0 (H) 10/09/2015   CHOLHDL 4 10/09/2015         ASSESSMENT / PLAN / RECOMMENDATIONS:   1) Newly Diagnosed Ketosis- Prone Diabetes Mellitus, Poorly controlled, With out complications - Most recent A1c of 12.1 %. Goal A1c < 7.0 %.    Plan:  - Praised the patient on lifestyle changes, he was encouraged to continue with glucose checks but he is having a problem affording his accu -check strips. He is going to stop by the office to pick up a savings card, he was also given the alternative of obtaining a [employer] meter from May, as the strips are more affordable. -He was encouraged to continue exercise and regular basis. -No changes will be made at this time.   MEDICATIONS:   Metformin 500 mg XR 1 tablet daily with breakfast  EDUCATION / INSTRUCTIONS:  BG monitoring instructions: Patient is instructed to check his blood sugars 1 times a day, fasting.  Call [facility] if: BG persistently < 70 or > 300. . I reviewed the Rule of 15 for the treatment of hypoglycemia in detail with the patient. Literature supplied.    I discussed the assessment and treatment plan with the patient. The patient was provided an opportunity to ask questions and all were answered. The patient agreed with the plan and demonstrated an understanding of the instructions.   The patient was advised to call back or seek an in-person evaluation if the symptoms worsen or if the condition  fails to improve as anticipated.    F/U in 3 months   Signed electronically by: Mack Guise, MD  [facility] Endocrinology  [facility] [address] [place], [place] [address] Phone: [phone] FAX: [phone]   CC: Darreld Mclean, [address] [place] [address] Phone: [phone]  Fax: [phone]  Return to Endocrinology clinic as below: Future Appointments  Date Time Provider [place]  09/01/2018  9:50 AM Miklos Bidinger, Melanie Crazier, MD [facility] None

## 2018-09-06 ENCOUNTER — Other Ambulatory Visit: Payer: Self-pay | Admitting: Family Medicine

## 2018-09-06 DIAGNOSIS — F411 Generalized anxiety disorder: Secondary | ICD-10-CM

## 2018-10-11 ENCOUNTER — Other Ambulatory Visit: Payer: Self-pay | Admitting: Family Medicine

## 2018-10-11 DIAGNOSIS — F5101 Primary insomnia: Secondary | ICD-10-CM

## 2018-10-12 ENCOUNTER — Other Ambulatory Visit: Payer: Self-pay | Admitting: Family Medicine

## 2018-10-12 ENCOUNTER — Encounter: Payer: Self-pay | Admitting: Family Medicine

## 2018-10-12 MED ORDER — TRAZODONE HCL 50 MG PO TABS: 100.0000 mg | ORAL_TABLET | Freq: Every evening | ORAL | 3 refills | Status: DC | PRN

## 2018-10-12 NOTE — Telephone Encounter (Signed)
Duplicate request. Pt just sent mychart message to provider.

## 2018-10-12 NOTE — Telephone Encounter (Signed)
Copied from [place] [id number]. Topic: Quick Communication - Rx Refill/Question >> Oct 12, 2018 11:22 AM Gustavus Messing wrote: Medication: traZODone (DESYREL) 50 MG tablet   Has the patient contacted their pharmacy? Yes.   (Agent: If yes, when and what did the pharmacy advise?) The patient contacted his pharmacy and waited a few days to tell him to contact his doctor's office.   Preferred Pharmacy (with phone number or street name): [id number] - [place], [place] - [address] [phone] (Phone) [phone] (Fax)    Agent: Please be advised that RX refills may take up to 3 business days. We ask that you follow-up with your pharmacy.

## 2018-12-05 ENCOUNTER — Ambulatory Visit: Payer: BLUE CROSS/BLUE SHIELD | Admitting: Internal Medicine

## 2018-12-05 DIAGNOSIS — Z0289 Encounter for other administrative examinations: Secondary | ICD-10-CM

## 2018-12-05 NOTE — Progress Notes (Deleted)
Name: Perry Li  Age/ Sex: 33 y.o., male   MRN/ DOB: [id number], 10/26/1985     PCP: Darreld Mclean, MD   Reason for Endocrinology Evaluation: Ketosis Prone  Diabetes Mellitus     Initial Endocrinology Clinic Visit:  12/05/2018    PATIENT IDENTIFIER: Perry Li is a 33 y.o. male with a past medical history of Ketosis Prone Diabetes and Anxiety . The patient has followed with Endocrinology clinic since 06/21/2018 for consultative assistance with management of his diabetes.  DIABETIC HISTORY:  Perry Li was diagnosed with ketosis prone diabetes in 05/2018 when he was admitted for DKA. He was discharged on Novolog and Levemir at the time. His hemoglobin A1c has ranged from 5.7% in 2017, peaking at 12.1% in 2020.   On his initial visit to our clinic, his A1c was 12.1 % . He had taken himself off insulin. We started Metformin   SUBJECTIVE:   During the last visit (09/01/2018): We continued Metformin one tablet daily  Today (12/05/2018): Perry Li is here for a 3 month follow up visit on his diabetes.  He checks his blood sugars 1x daily. He has not had any hypoglycemic episodes. Otherwise, the patient has not required any recent emergency interventions for hypoglycemia and has not had recent hospitalizations secondary to hyper or hypoglycemic episodes.    ROS: As per HPI and as detailed below: Review of Systems  HENT: Positive for congestion. Negative for sore throat.   Respiratory: Negative for cough and shortness of breath.   Cardiovascular: Negative for chest pain and palpitations.  Gastrointestinal: Negative for diarrhea and nausea.      HOME DIABETES REGIMEN:  Metformin 500 mg XR 1 tablet with breakfast    GLUCOSE LOG:  08/31/18    Fasting 95 mg/dL  4/29         Dinner 125 mg/dL        HISTORY:  Past Medical History:  Past Medical History:  Diagnosis Date  . Anxiety   . Chest pain   . High cholesterol   . Insomnia   . SOB (shortness of  breath)    Past Surgical History:  Past Surgical History:  Procedure Laterality Date  . NO PAST SURGERIES      Social History:  reports that he has never smoked. He has never used smokeless tobacco. He reports current alcohol use. He reports that he does not use drugs. Family History:  Family History  Problem Relation Age of Onset  . Healthy Mother   . Hypertension Father   . Diabetes Maternal Grandmother      HOME MEDICATIONS: Allergies as of 12/05/2018   No Known Allergies     Medication List       Accurate as of December 05, 2018  7:44 AM. If you have any questions, ask your nurse or doctor.        blood glucose meter kit and supplies Dispense based on patient and insurance preference. Use up to twice daily as directed. (FOR ICD-10 E10.9, E11.9).   blood glucose meter kit and supplies Kit Dispense based on patient and insurance preference. Use up to four times daily as directed. (FOR ICD-9 250.00, 250.01).   clonazePAM 1 MG tablet Commonly known as: KLONOPIN TAKE 1 TABLET(1 MG) BY MOUTH THREE TIMES DAILY AS NEEDED FOR ANXIETY   metFORMIN 500 MG 24 hr tablet Commonly known as: Glucophage XR Take 1 tablet (500 mg total) by mouth daily with breakfast.   traZODone  50 MG tablet Commonly known as: DESYREL Take 2 tablets (100 mg total) by mouth at bedtime as needed for sleep.   traZODone 50 MG tablet Commonly known as: DESYREL TAKE 1 TABLET BY MOUTH AT BEDTIME FOR 7 TO 10 DAYS THEN 1/2 TABLET DAILY FOR 7 TO 10 DAYS THEN STOP   traZODone 150 MG tablet Commonly known as: DESYREL TAKE 1 TABLET BY MOUTH AT BEDTIME   venlafaxine XR 75 MG 24 hr capsule Commonly known as: EFFEXOR-XR Take 1 capsule (75 mg total) by mouth daily with breakfast.         DATA REVIEWED:  Lab Results  Component Value Date   HGBA1C 12.1 (H) 05/12/2018   HGBA1C 5.7 10/09/2015   Lab Results  Component Value Date   CREATININE 0.87 05/18/2018     Lab Results  Component Value Date    CHOL 165 10/09/2015   HDL 38.30 (L) 10/09/2015   LDLDIRECT 110.0 10/09/2015   TRIG 201.0 (H) 10/09/2015   CHOLHDL 4 10/09/2015         ASSESSMENT / PLAN / RECOMMENDATIONS:   1) Newly Diagnosed Ketosis- Prone Diabetes Mellitus, Without complications - Most recent A1c of 12.1 %. Goal A1c < 7.0 %.    Plan:  - Praised the patient on lifestyle changes, he was encouraged to continue with glucose checks but he is having a problem affording his accu -check strips. He is going to stop by the office to pick up a savings card, he was also given the alternative of obtaining a ReliOn meter from [place], as the strips are more affordable. -He was encouraged to continue exercise and regular basis. -No changes will be made at this time.   MEDICATIONS:   Metformin 500 mg XR 1 tablet daily with breakfast  EDUCATION / INSTRUCTIONS:  BG monitoring instructions: Patient is instructed to check his blood sugars 1 times a day, fasting.  Call [facility] if: BG persistently < 70 or > 300. . I reviewed the Rule of 15 for the treatment of hypoglycemia in detail with the patient. Literature supplied.     F/U in 3 months   Signed electronically by: Mack Guise, MD  [facility]  [address] [place], [place] [address] Phone: [phone] FAX: [phone]   CC: Darreld Mclean, [address] [place] [address] Phone: [phone]  Fax: [phone]  Return to Endocrinology clinic as below: Future Appointments  Date Time Provider [place]  12/05/2018  9:50 AM Neill Jurewicz, Melanie Crazier, MD [facility] None

## 2019-01-13 ENCOUNTER — Encounter: Payer: Self-pay | Admitting: Family Medicine

## 2019-01-15 ENCOUNTER — Telehealth: Payer: Self-pay | Admitting: Family Medicine

## 2019-01-15 ENCOUNTER — Other Ambulatory Visit: Payer: Self-pay | Admitting: Family Medicine

## 2019-01-15 DIAGNOSIS — F411 Generalized anxiety disorder: Secondary | ICD-10-CM

## 2019-01-15 MED ORDER — CLONAZEPAM 1 MG PO TABS: ORAL_TABLET | ORAL | 2 refills | Status: DC

## 2019-01-15 NOTE — Telephone Encounter (Signed)
Pt would like the dr to ok a "one day early" pickup to pick up Wed, Sept 23 for the Klonopin. Medication is already pending for review Pt states he is going out of town on family vacation.

## 2019-01-15 NOTE — Telephone Encounter (Signed)
Ok- that is fine   Fill Date ID   Written Drug Qty Days Prescriber Rx # Pharmacy Refill   Daily Dose* Pymt Type PMP    12/26/2018  1   09/08/2018  Clonazepam 1 MG Tablet  90.00  30 Je Cop   [id number]   [id number]   2  6.00 LME  Comm Ins   [place]  11/30/2018  1   09/08/2018  Clonazepam 1 MG Tablet  90.00  30 Je Cop   [id number]   [facility] ([id number])   1  6.00 LME  Comm Ins   [place]  11/02/2018  1   09/08/2018  Clonazepam 1 MG Tablet  90.00  30 Je Cop   [id number]   [facility] ([id number])   0  6.00 LME  Comm Ins   [place]  10/04/2018  1   08/01/2018  Clonazepam 1 MG Tablet  90.00  30 Je Cop   [id number]   [facility] ([id number])   2  6.00 LME  Comm Ins   [place]  09/04/2018  1   08/01/2018  Clonazepam 1 MG Tablet  90.00  30 Je Cop   [id number]   [facility] ([id number])   1  6.00 LME  Comm Ins   [place]  08/10/2018  1   08/01/2018  Clonazepam 1 MG Tablet  90.00  30 Je Cop   [id number]   [facility] ([id number])   0  6.00 LME  Comm Ins   [place]  07/12/2018  1   06/28/2018  Clonazepam 1 MG Tablet  90.00  30 Je Cop   [id number]   [facility] ([id number])   0  6.00 LME  Comm Ins   [place]  06/15/2018  1   06/14/2018  Clonazepam 1 MG Tablet  90.00  30 Je Cop   [id number]   [facility] ([id number])   0  6.00 LME  Comm Ins   [place]

## 2019-01-15 NOTE — Telephone Encounter (Signed)
See mychart message from patient.

## 2019-01-15 NOTE — Telephone Encounter (Signed)
Pt request refill  clonazePAM (KLONOPIN) 1 MG tablet  Pt would like the dr to ok a "one day early" pickup to pick up Wed, Sept 23. Pt states he is going out of town on family vacation.   [id number] - [place],  - [address] AT [place] [phone] (Phone) [phone] (Fax)

## 2019-02-20 ENCOUNTER — Other Ambulatory Visit: Payer: Self-pay | Admitting: Family Medicine

## 2019-02-20 DIAGNOSIS — F411 Generalized anxiety disorder: Secondary | ICD-10-CM

## 2019-02-21 ENCOUNTER — Encounter: Payer: Self-pay | Admitting: Family Medicine

## 2019-02-21 NOTE — Telephone Encounter (Signed)
Requesting:clonazepam Contract:none, needs csc JKK:XFGH, needs uds Last Visit:06/14/2018 Next Visit:none scheduled  Last Refill:01/15/2019  Please Advise

## 2019-04-14 NOTE — Progress Notes (Addendum)
[facility] [address] [place], [place] [address] [phone]  Date:  04/16/2019   Name:  Perry Li   DOB:  January 31, 1986   MRN:  [id number]  PCP:  Darreld Mclean, MD    Chief Complaint: Diabetes   History of Present Illness:  Perry Li is a 33 y.o. very pleasant male patient who presents with the following:  An office visit today for routine follow-up Patient with history of diabetes/episodes of ketosis, hyperlipidemia, elevated LFTs  Last seen by myself in February of this year for follow-up I had seen him about a month previous to that visit with new onset diabetes and DKA, resulting in hospital admission He is under the care of endocrinology, Dr. Wilnette Kales visit with her in May  He is trying to watch his weight and exercise at home He does hope to return to the gym soon  Wt Readings from Last 3 Encounters:  04/16/19 262 lb (118.8 kg)  06/21/18 268 lb 9.6 oz (121.8 kg)  06/14/18 261 lb (118.4 kg)   He does not typically check his glucose at home recently   He is currently using just Metformin to control his blood sugars- however he reports he has actually not taking this on a regular basis for several months  Needs an A1c, other routine labs today  Foot exam Eye exam- he needs to get this done  Flu shot- give today  Pneumonia shot- give today  He takes trazodone 75- 100 for sleep  He is taking klonopin 1 mg 2-3x a day He feels like this works well for his anxiety and wishes to continue taking it He stopped taking his effexor  Lab Results  Component Value Date   HGBA1C 12.1 (H) 05/12/2018     Patient Active Problem List   Diagnosis Date Noted  . Ketosis-prone diabetes mellitus ([place]) 06/21/2018  . Elevated LFTs 05/23/2018  . Hyperglycemia 05/12/2018  . DKA (diabetic ketoacidoses) ([place]) 05/11/2018  . Right Achilles tendinitis 08/15/2014  . High cholesterol   .  Anxiety   . Insomnia     Past Medical History:  Diagnosis Date  . Anxiety   . Chest pain   . High cholesterol   . Insomnia   . SOB (shortness of breath)     Past Surgical History:  Procedure Laterality Date  . NO PAST SURGERIES      Social History   Tobacco Use  . Smoking status: Never Smoker  . Smokeless tobacco: Never Used  Substance Use Topics  . Alcohol use: Yes    Alcohol/week: 0.0 standard drinks    Comment: occasional  . Drug use: No    Family History  Problem Relation Age of Onset  . Healthy Mother   . Hypertension Father   . Diabetes Maternal Grandmother     No Known Allergies  Medication list has been reviewed and updated.  Current Outpatient Medications on File Prior to Visit  Medication Sig Dispense Refill  . blood glucose meter kit and supplies KIT Dispense based on patient and insurance preference. Use up to four times daily as directed. (FOR ICD-9 250.00, 250.01). 1 each 0  . blood glucose meter kit and supplies Dispense based on patient and insurance preference. Use up to twice daily as directed. (FOR ICD-10 E10.9, E11.9). 1 each 0  . traZODone (DESYREL) 150 MG tablet TAKE 1 TABLET BY MOUTH AT BEDTIME 30 tablet 3  . traZODone (  DESYREL) 50 MG tablet Take 2 tablets (100 mg total) by mouth at bedtime as needed for sleep. 90 tablet 3   No current facility-administered medications on file prior to visit.    Review of Systems:  As per HPI- otherwise negative.   Physical Examination: Vitals:   04/16/19 0953  BP: 130/82  Pulse: 89  Resp: 17  Temp: (!) 96.8 F (36 C)  SpO2: 98%   Vitals:   04/16/19 0953  Weight: 262 lb (118.8 kg)  Height: 5' 11.5" (1.816 m)   Body mass index is 36.03 kg/m. Ideal Body Weight: Weight in (lb) to have BMI = 25: 181.4  GEN: WDWN, NAD, Non-toxic, A & O x 3, obese, looks well  HEENT: Atraumatic, Normocephalic. Neck supple. No masses, No LAD. Ears and Nose: No external deformity. CV: RRR, No M/G/R. No JVD.  No thrill. No extra heart sounds. PULM: CTA B, no wheezes, crackles, rhonchi. No retractions. No resp. distress. No accessory muscle use. EXTR: No c/c/e NEURO Normal gait.  PSYCH: Normally interactive. Conversant. Not depressed or anxious appearing.  Calm demeanor.  Foot exam done today- normal   Assessment and Plan: Ketosis-prone diabetes mellitus ([place]) - Plan: Comprehensive metabolic panel, Hemoglobin A1c, Microalbumin / creatinine urine ratio, metFORMIN (GLUCOPHAGE XR) 500 MG 24 hr tablet  Elevated liver function tests - Plan: CBC, Comprehensive metabolic panel  Hyperglycemia  High cholesterol - Plan: Lipid panel  Screening for HIV (human immunodeficiency virus) - Plan: CANCELED: HIV antibody  Immunization due - Plan: Pneumococcal (PPSV23) vaccine  GAD (generalized anxiety disorder) - Plan: clonazePAM (KLONOPIN) 1 MG tablet  Needs flu shot - Plan: Flu Vaccine QUAD 6+ mos PF IM (Fluarix Quad PF)  Following up today on diabetes Patient is actually stopped taking his Metformin mood over the last several months, and is no longer checking his blood sugar.  He is trying to follow better diet and exercise program.  The Metformin is likely to be helpful for him, I encouraged him to continue taking it even if his sugars do look okay.  We will check on his labs today and I will be in touch as soon as possible  Flu shot and pneumonia vaccine today Refill clonazepam for anxiety   This visit occurred during the SARS-CoV-2 public health emergency.  Safety protocols were in place, including screening questions prior to the visit, additional usage of staff PPE, and extensive cleaning of exam room while observing appropriate contact time as indicated for disinfecting solutions.    Signed Lamar Blinks, MD  Received his labs 12/15, message to patient ALT is elevated, although liver function better than last year Fatty liver seen on ultrasound in January of this year Negative hepatitis  panel 2017  Results for orders placed or performed in visit on 04/16/19  CBC  Result Value Ref Range   WBC 7.8 4.0 - 10.5 K/uL   RBC 4.94 4.22 - 5.81 Mil/uL   Platelets 255.0 150.0 - 400.0 K/uL   Hemoglobin 15.1 13.0 - 17.0 g/dL   HCT 45.5 39.0 - 52.0 %   MCV 92.0 78.0 - 100.0 fl   MCHC 33.3 30.0 - 36.0 g/dL   RDW 12.9 11.5 - 15.5 %  Comprehensive metabolic panel  Result Value Ref Range   Sodium 141 135 - 145 mEq/L   Potassium 4.3 3.5 - 5.1 mEq/L   Chloride 106 96 - 112 mEq/L   CO2 25 19 - 32 mEq/L   Glucose, Bld 160 (H) 70 - 99 mg/dL  BUN 17 6 - 23 mg/dL   Creatinine, Ser 1.01 0.40 - 1.50 mg/dL   Total Bilirubin 0.4 0.2 - 1.2 mg/dL   Alkaline Phosphatase 62 39 - 117 U/L   AST 32 0 - 37 U/L   ALT 81 (H) 0 - 53 U/L   Total Protein 7.1 6.0 - 8.3 g/dL   Albumin 4.7 3.5 - 5.2 g/dL   GFR 85.87 >60.00 mL/min   Calcium 9.3 8.4 - 10.5 mg/dL  Hemoglobin A1c  Result Value Ref Range   Hgb A1c MFr Bld 5.8 4.6 - 6.5 %  Lipid panel  Result Value Ref Range   Cholesterol 174 0 - 200 mg/dL   Triglycerides 344.0 (H) 0.0 - 149.0 mg/dL   HDL 41.20 >39.00 mg/dL   VLDL 68.8 (H) 0.0 - 40.0 mg/dL   Total CHOL/HDL Ratio 4    NonHDL 133.06   Microalbumin / creatinine urine ratio  Result Value Ref Range   Microalb, Ur 1.4 0.0 - 1.9 mg/dL   Creatinine,U 160.1 mg/dL   Microalb Creat Ratio 0.9 0.0 - 30.0 mg/g  LDL cholesterol, direct  Result Value Ref Range   Direct LDL 95.0 mg/dL    Blood counts are normal Metabolic profile shows elevated blood sugar, also elevation of your ALT which is one of your liver function tests.  However this is improved since last year Your A1c-average blood sugar over the previous 3 months-looks good.  I would still recommend taking Metformin once a day for the reasons we discussed in clinic  Your cholesterol looks generally okay except for elevated triglycerides-I would suggest starting a cholesterol medication at this time if okay with you  Please let me know  about the cholesterol medication.  Please see me in about 4 months for follow-up/recheck

## 2019-04-14 NOTE — Patient Instructions (Signed)
It was great to see you again today, I will be in touch with your labs ASAP Please get an annual routine diabetic eye exam You got your flu and pneumonia vaccine today You likely need to continue to take metformin for blood sugar control- I went ahead and refilled this for you today

## 2019-04-16 ENCOUNTER — Encounter: Payer: Self-pay | Admitting: Family Medicine

## 2019-04-16 ENCOUNTER — Ambulatory Visit (INDEPENDENT_AMBULATORY_CARE_PROVIDER_SITE_OTHER): Payer: BC Managed Care – PPO | Admitting: Family Medicine

## 2019-04-16 ENCOUNTER — Other Ambulatory Visit: Payer: Self-pay

## 2019-04-16 VITALS — BP 130/82 | HR 89 | Temp 96.8°F | Resp 17 | Ht 71.5 in | Wt 262.0 lb

## 2019-04-16 DIAGNOSIS — E109 Type 1 diabetes mellitus without complications: Secondary | ICD-10-CM | POA: Diagnosis not present

## 2019-04-16 DIAGNOSIS — Z114 Encounter for screening for human immunodeficiency virus [HIV]: Secondary | ICD-10-CM

## 2019-04-16 DIAGNOSIS — Z23 Encounter for immunization: Secondary | ICD-10-CM | POA: Diagnosis not present

## 2019-04-16 DIAGNOSIS — R739 Hyperglycemia, unspecified: Secondary | ICD-10-CM | POA: Diagnosis not present

## 2019-04-16 DIAGNOSIS — F411 Generalized anxiety disorder: Secondary | ICD-10-CM

## 2019-04-16 DIAGNOSIS — E7800 Pure hypercholesterolemia, unspecified: Secondary | ICD-10-CM

## 2019-04-16 DIAGNOSIS — R7989 Other specified abnormal findings of blood chemistry: Secondary | ICD-10-CM | POA: Diagnosis not present

## 2019-04-16 LAB — COMPREHENSIVE METABOLIC PANEL
ALT: 81 U/L — ABNORMAL HIGH (ref 0–53)
AST: 32 U/L (ref 0–37)
Albumin: 4.7 g/dL (ref 3.5–5.2)
Alkaline Phosphatase: 62 U/L (ref 39–117)
BUN: 17 mg/dL (ref 6–23)
CO2: 25 mEq/L (ref 19–32)
Calcium: 9.3 mg/dL (ref 8.4–10.5)
Chloride: 106 mEq/L (ref 96–112)
Creatinine, Ser: 1.01 mg/dL (ref 0.40–1.50)
GFR: 85.87 mL/min (ref >60.00)
Glucose, Bld: 160 mg/dL — ABNORMAL HIGH (ref 70–99)
Potassium: 4.3 mEq/L (ref 3.5–5.1)
Sodium: 141 mEq/L (ref 135–145)
Total Bilirubin: 0.4 mg/dL (ref 0.2–1.2)
Total Protein: 7.1 g/dL (ref 6.0–8.3)

## 2019-04-16 LAB — LIPID PANEL
Cholesterol: 174 mg/dL (ref 0–200)
HDL: 41.2 mg/dL (ref >39.00)
NonHDL: 133.06
Total CHOL/HDL Ratio: 4
Triglycerides: 344 mg/dL — ABNORMAL HIGH (ref 0.0–149.0)
VLDL: 68.8 mg/dL — ABNORMAL HIGH (ref 0.0–40.0)

## 2019-04-16 LAB — LDL CHOLESTEROL, DIRECT: Direct LDL: 95 mg/dL

## 2019-04-16 LAB — CBC
HCT: 45.5 % (ref 39.0–52.0)
Hemoglobin: 15.1 g/dL (ref 13.0–17.0)
MCHC: 33.3 g/dL (ref 30.0–36.0)
MCV: 92 fl (ref 78.0–100.0)
Platelets: 255 10*3/uL (ref 150.0–400.0)
RBC: 4.94 Mil/uL (ref 4.22–5.81)
RDW: 12.9 % (ref 11.5–15.5)
WBC: 7.8 10*3/uL (ref 4.0–10.5)

## 2019-04-16 LAB — MICROALBUMIN / CREATININE URINE RATIO
Creatinine,U: 160.1 mg/dL
Microalb Creat Ratio: 0.9 mg/g (ref 0.0–30.0)
Microalb, Ur: 1.4 mg/dL (ref 0.0–1.9)

## 2019-04-16 LAB — HEMOGLOBIN A1C: Hgb A1c MFr Bld: 5.8 % (ref 4.6–6.5)

## 2019-04-16 MED ORDER — CLONAZEPAM 1 MG PO TABS: ORAL_TABLET | ORAL | 2 refills | Status: DC

## 2019-04-16 MED ORDER — METFORMIN HCL ER 500 MG PO TB24: 500.0000 mg | ORAL_TABLET | Freq: Every day | ORAL | 3 refills | Status: AC

## 2019-04-17 ENCOUNTER — Encounter: Payer: Self-pay | Admitting: Family Medicine

## 2019-04-22 ENCOUNTER — Inpatient Hospital Stay (HOSPITAL_COMMUNITY): Payer: BC Managed Care – PPO

## 2019-04-22 ENCOUNTER — Other Ambulatory Visit: Payer: Self-pay

## 2019-04-22 ENCOUNTER — Encounter (HOSPITAL_COMMUNITY): Payer: Self-pay | Admitting: Emergency Medicine

## 2019-04-22 ENCOUNTER — Emergency Department (HOSPITAL_COMMUNITY): Payer: BC Managed Care – PPO

## 2019-04-22 ENCOUNTER — Inpatient Hospital Stay (HOSPITAL_COMMUNITY)
Admission: EM | Admit: 2019-04-22 | Discharge: 2019-04-29 | DRG: 917 | Disposition: A | Discharge status: Home / Self Care | Payer: BC Managed Care – PPO | Attending: Family Medicine | Admitting: Family Medicine

## 2019-04-22 DIAGNOSIS — F19239 Other psychoactive substance dependence with withdrawal, unspecified: Secondary | ICD-10-CM | POA: Diagnosis not present

## 2019-04-22 DIAGNOSIS — Z978 Presence of other specified devices: Secondary | ICD-10-CM

## 2019-04-22 DIAGNOSIS — F909 Attention-deficit hyperactivity disorder, unspecified type: Secondary | ICD-10-CM | POA: Diagnosis present

## 2019-04-22 DIAGNOSIS — F419 Anxiety disorder, unspecified: Secondary | ICD-10-CM | POA: Diagnosis present

## 2019-04-22 DIAGNOSIS — T43211A Poisoning by selective serotonin and norepinephrine reuptake inhibitors, accidental (unintentional), initial encounter: Principal | ICD-10-CM | POA: Diagnosis present

## 2019-04-22 DIAGNOSIS — T50904A Poisoning by unspecified drugs, medicaments and biological substances, undetermined, initial encounter: Secondary | ICD-10-CM

## 2019-04-22 DIAGNOSIS — E119 Type 2 diabetes mellitus without complications: Secondary | ICD-10-CM | POA: Diagnosis present

## 2019-04-22 DIAGNOSIS — E7800 Pure hypercholesterolemia, unspecified: Secondary | ICD-10-CM | POA: Diagnosis present

## 2019-04-22 DIAGNOSIS — Z20828 Contact with and (suspected) exposure to other viral communicable diseases: Secondary | ICD-10-CM | POA: Diagnosis present

## 2019-04-22 DIAGNOSIS — E785 Hyperlipidemia, unspecified: Secondary | ICD-10-CM | POA: Diagnosis present

## 2019-04-22 DIAGNOSIS — J9600 Acute respiratory failure, unspecified whether with hypoxia or hypercapnia: Secondary | ICD-10-CM

## 2019-04-22 DIAGNOSIS — R7401 Elevation of levels of liver transaminase levels: Secondary | ICD-10-CM | POA: Diagnosis present

## 2019-04-22 DIAGNOSIS — I959 Hypotension, unspecified: Secondary | ICD-10-CM | POA: Diagnosis not present

## 2019-04-22 DIAGNOSIS — N179 Acute kidney failure, unspecified: Secondary | ICD-10-CM | POA: Diagnosis present

## 2019-04-22 DIAGNOSIS — Z833 Family history of diabetes mellitus: Secondary | ICD-10-CM | POA: Diagnosis not present

## 2019-04-22 DIAGNOSIS — J690 Pneumonitis due to inhalation of food and vomit: Secondary | ICD-10-CM | POA: Diagnosis present

## 2019-04-22 DIAGNOSIS — T424X1A Poisoning by benzodiazepines, accidental (unintentional), initial encounter: Secondary | ICD-10-CM | POA: Diagnosis present

## 2019-04-22 DIAGNOSIS — J9601 Acute respiratory failure with hypoxia: Secondary | ICD-10-CM

## 2019-04-22 DIAGNOSIS — E876 Hypokalemia: Secondary | ICD-10-CM | POA: Diagnosis not present

## 2019-04-22 DIAGNOSIS — R34 Anuria and oliguria: Secondary | ICD-10-CM | POA: Diagnosis not present

## 2019-04-22 DIAGNOSIS — J9602 Acute respiratory failure with hypercapnia: Secondary | ICD-10-CM | POA: Diagnosis not present

## 2019-04-22 DIAGNOSIS — Z7984 Long term (current) use of oral hypoglycemic drugs: Secondary | ICD-10-CM

## 2019-04-22 DIAGNOSIS — J80 Acute respiratory distress syndrome: Secondary | ICD-10-CM | POA: Diagnosis present

## 2019-04-22 DIAGNOSIS — J189 Pneumonia, unspecified organism: Secondary | ICD-10-CM

## 2019-04-22 DIAGNOSIS — T41295A Adverse effect of other general anesthetics, initial encounter: Secondary | ICD-10-CM | POA: Diagnosis not present

## 2019-04-22 DIAGNOSIS — I5031 Acute diastolic (congestive) heart failure: Secondary | ICD-10-CM | POA: Diagnosis present

## 2019-04-22 DIAGNOSIS — R809 Proteinuria, unspecified: Secondary | ICD-10-CM | POA: Diagnosis not present

## 2019-04-22 DIAGNOSIS — J9690 Respiratory failure, unspecified, unspecified whether with hypoxia or hypercapnia: Secondary | ICD-10-CM | POA: Diagnosis present

## 2019-04-22 DIAGNOSIS — Z8249 Family history of ischemic heart disease and other diseases of the circulatory system: Secondary | ICD-10-CM | POA: Diagnosis not present

## 2019-04-22 DIAGNOSIS — T50901A Poisoning by unspecified drugs, medicaments and biological substances, accidental (unintentional), initial encounter: Secondary | ICD-10-CM | POA: Diagnosis not present

## 2019-04-22 DIAGNOSIS — R0602 Shortness of breath: Secondary | ICD-10-CM

## 2019-04-22 DIAGNOSIS — Z9911 Dependence on respirator [ventilator] status: Secondary | ICD-10-CM | POA: Diagnosis not present

## 2019-04-22 DIAGNOSIS — E8770 Fluid overload, unspecified: Secondary | ICD-10-CM | POA: Diagnosis not present

## 2019-04-22 DIAGNOSIS — E1129 Type 2 diabetes mellitus with other diabetic kidney complication: Secondary | ICD-10-CM | POA: Diagnosis not present

## 2019-04-22 LAB — COMPREHENSIVE METABOLIC PANEL
ALT: 102 U/L — ABNORMAL HIGH (ref 0–44)
ALT: 95 U/L — ABNORMAL HIGH (ref 0–44)
AST: 59 U/L — ABNORMAL HIGH (ref 15–41)
AST: 54 U/L — ABNORMAL HIGH (ref 15–41)
Albumin: 4.3 g/dL (ref 3.5–5.0)
Albumin: 4.2 g/dL (ref 3.5–5.0)
Alkaline Phosphatase: 61 U/L (ref 38–126)
Alkaline Phosphatase: 57 U/L (ref 38–126)
Anion gap: 12 (ref 5–15)
Anion gap: 13 (ref 5–15)
BUN: 15 mg/dL (ref 6–20)
BUN: 17 mg/dL (ref 6–20)
CO2: 24 mmol/L (ref 22–32)
CO2: 22 mmol/L (ref 22–32)
Calcium: 8 mg/dL — ABNORMAL LOW (ref 8.9–10.3)
Calcium: 8.2 mg/dL — ABNORMAL LOW (ref 8.9–10.3)
Chloride: 102 mmol/L (ref 98–111)
Chloride: 105 mmol/L (ref 98–111)
Creatinine, Ser: 1.19 mg/dL (ref 0.61–1.24)
Creatinine, Ser: 1.56 mg/dL — ABNORMAL HIGH (ref 0.61–1.24)
GFR calc Af Amer: >60 mL/min (ref >60)
GFR calc Af Amer: >60 mL/min (ref >60)
GFR calc non Af Amer: >60 mL/min (ref >60)
GFR calc non Af Amer: 58 mL/min — ABNORMAL LOW (ref >60)
Glucose, Bld: 221 mg/dL — ABNORMAL HIGH (ref 70–99)
Glucose, Bld: 110 mg/dL — ABNORMAL HIGH (ref 70–99)
Potassium: 5.7 mmol/L — ABNORMAL HIGH (ref 3.5–5.1)
Potassium: 4.7 mmol/L (ref 3.5–5.1)
Sodium: 138 mmol/L (ref 135–145)
Sodium: 140 mmol/L (ref 135–145)
Total Bilirubin: 0.5 mg/dL (ref 0.3–1.2)
Total Bilirubin: 0.4 mg/dL (ref 0.3–1.2)
Total Protein: 7.5 g/dL (ref 6.5–8.1)
Total Protein: 6.8 g/dL (ref 6.5–8.1)

## 2019-04-22 LAB — GLUCOSE, CAPILLARY
Glucose-Capillary: 104 mg/dL — ABNORMAL HIGH (ref 70–99)
Glucose-Capillary: 135 mg/dL — ABNORMAL HIGH (ref 70–99)
Glucose-Capillary: 144 mg/dL — ABNORMAL HIGH (ref 70–99)
Glucose-Capillary: 157 mg/dL — ABNORMAL HIGH (ref 70–99)
Glucose-Capillary: 166 mg/dL — ABNORMAL HIGH (ref 70–99)
Glucose-Capillary: 187 mg/dL — ABNORMAL HIGH (ref 70–99)

## 2019-04-22 LAB — CBC WITH DIFFERENTIAL/PLATELET
Abs Immature Granulocytes: 0.05 10*3/uL (ref 0.00–0.07)
Basophils Absolute: 0.1 10*3/uL (ref 0.0–0.1)
Basophils Relative: 1 %
Eosinophils Absolute: 0.1 10*3/uL (ref 0.0–0.5)
Eosinophils Relative: 1 %
HCT: 52.7 % — ABNORMAL HIGH (ref 39.0–52.0)
Hemoglobin: 16.6 g/dL (ref 13.0–17.0)
Immature Granulocytes: 1 %
Lymphocytes Relative: 22 %
Lymphs Abs: 2.2 10*3/uL (ref 0.7–4.0)
MCH: 30.5 pg (ref 26.0–34.0)
MCHC: 31.5 g/dL (ref 30.0–36.0)
MCV: 96.7 fL (ref 80.0–100.0)
Monocytes Absolute: 0.2 10*3/uL (ref 0.1–1.0)
Monocytes Relative: 2 %
Neutro Abs: 7.5 10*3/uL (ref 1.7–7.7)
Neutrophils Relative %: 73 %
Platelets: 361 10*3/uL (ref 150–400)
RBC: 5.45 MIL/uL (ref 4.22–5.81)
RDW: 12.2 % (ref 11.5–15.5)
WBC: 10.2 10*3/uL (ref 4.0–10.5)
nRBC: 0 % (ref 0.0–0.2)

## 2019-04-22 LAB — BLOOD GAS, ARTERIAL
Acid-base deficit: 9.3 mmol/L — ABNORMAL HIGH (ref 0.0–2.0)
Acid-base deficit: 10.4 mmol/L — ABNORMAL HIGH (ref 0.0–2.0)
Acid-base deficit: 5.7 mmol/L — ABNORMAL HIGH (ref 0.0–2.0)
Bicarbonate: 20.6 mmol/L (ref 20.0–28.0)
Bicarbonate: 22.5 mmol/L (ref 20.0–28.0)
Bicarbonate: 22.2 mmol/L (ref 20.0–28.0)
Drawn by: 51425
Drawn by: 225631
FIO2: 100
FIO2: 60
FIO2: 60
MECHVT: 620 mL
MECHVT: 0.5 mL
O2 Saturation: 94.4 %
O2 Saturation: 94.8 %
O2 Saturation: 97.7 %
PEEP: 5 cmH2O
PEEP: 10 cmH2O
Patient temperature: 99.7
Patient temperature: 98.6
Patient temperature: 102.3
RATE: 16 resp/min
RATE: 30 resp/min
pCO2 arterial: 62 mmHg — ABNORMAL HIGH (ref 32.0–48.0)
pCO2 arterial: 69.1 mmHg (ref 32.0–48.0)
pCO2 arterial: 59.6 mmHg — ABNORMAL HIGH (ref 32.0–48.0)
pH, Arterial: 7.152 — CL (ref 7.350–7.450)
pH, Arterial: 7.139 — CL (ref 7.350–7.450)
pH, Arterial: 7.209 — ABNORMAL LOW (ref 7.350–7.450)
pO2, Arterial: 96.3 mmHg (ref 83.0–108.0)
pO2, Arterial: 87.9 mmHg (ref 83.0–108.0)
pO2, Arterial: 118 mmHg — ABNORMAL HIGH (ref 83.0–108.0)

## 2019-04-22 LAB — ETHANOL: Alcohol, Ethyl (B): 61 mg/dL — ABNORMAL HIGH (ref <10)

## 2019-04-22 LAB — PHOSPHORUS: Phosphorus: 4.4 mg/dL (ref 2.5–4.6)

## 2019-04-22 LAB — URINALYSIS, ROUTINE W REFLEX MICROSCOPIC
Bilirubin Urine: NEGATIVE
Glucose, UA: 150 mg/dL — AB
Hgb urine dipstick: NEGATIVE
Ketones, ur: 5 mg/dL — AB
Leukocytes,Ua: NEGATIVE
Nitrite: NEGATIVE
Protein, ur: 100 mg/dL — AB
Specific Gravity, Urine: 1.024 (ref 1.005–1.030)
pH: 5 (ref 5.0–8.0)

## 2019-04-22 LAB — BLOOD GAS, VENOUS
Acid-base deficit: 4.4 mmol/L — ABNORMAL HIGH (ref 0.0–2.0)
Bicarbonate: 24.8 mmol/L (ref 20.0–28.0)
O2 Saturation: 96.3 %
Patient temperature: 98.6
pCO2, Ven: 63.1 mmHg — ABNORMAL HIGH (ref 44.0–60.0)
pH, Ven: 7.219 — ABNORMAL LOW (ref 7.250–7.430)
pO2, Ven: 92.8 mmHg — ABNORMAL HIGH (ref 32.0–45.0)

## 2019-04-22 LAB — SALICYLATE LEVEL: Salicylate Lvl: <7 mg/dL (ref 2.8–30.0)

## 2019-04-22 LAB — ACETAMINOPHEN LEVEL: Acetaminophen (Tylenol), Serum: <10 ug/mL — ABNORMAL LOW (ref 10–30)

## 2019-04-22 LAB — HEMOGLOBIN A1C
Hgb A1c MFr Bld: 6.1 % — ABNORMAL HIGH (ref 4.8–5.6)
Mean Plasma Glucose: 128.37 mg/dL

## 2019-04-22 LAB — RESPIRATORY PANEL BY RT PCR (FLU A&B, COVID)
Influenza A by PCR: NEGATIVE
Influenza B by PCR: NEGATIVE
SARS Coronavirus 2 by RT PCR: NEGATIVE

## 2019-04-22 LAB — PROTIME-INR
INR: 0.9 (ref 0.8–1.2)
Prothrombin Time: 12.4 seconds (ref 11.4–15.2)

## 2019-04-22 LAB — CBC
HCT: 53.6 % — ABNORMAL HIGH (ref 39.0–52.0)
Hemoglobin: 16.9 g/dL (ref 13.0–17.0)
MCH: 30.2 pg (ref 26.0–34.0)
MCHC: 31.5 g/dL (ref 30.0–36.0)
MCV: 95.9 fL (ref 80.0–100.0)
Platelets: 303 10*3/uL (ref 150–400)
RBC: 5.59 MIL/uL (ref 4.22–5.81)
RDW: 12.1 % (ref 11.5–15.5)
WBC: 8.7 10*3/uL (ref 4.0–10.5)
nRBC: 0 % (ref 0.0–0.2)

## 2019-04-22 LAB — MAGNESIUM: Magnesium: 1.8 mg/dL (ref 1.7–2.4)

## 2019-04-22 LAB — CK: Total CK: 271 U/L (ref 49–397)

## 2019-04-22 LAB — MRSA PCR SCREENING: MRSA by PCR: NEGATIVE

## 2019-04-22 MED ORDER — SODIUM CHLORIDE 0.9 % IV SOLN
250.0000 mL | INTRAVENOUS | Status: DC
  Administered 2019-04-22: 250 mL via INTRAVENOUS

## 2019-04-22 MED ORDER — ADULT MULTIVITAMIN LIQUID CH
15.0000 mL | Freq: Every day | ORAL | Status: DC
  Administered 2019-04-23: 15 mL
  Filled 2019-04-22: qty 15

## 2019-04-22 MED ORDER — ACETAMINOPHEN 160 MG/5ML PO SOLN: 650.0000 mg | Freq: Four times a day (QID) | ORAL | Status: DC | PRN

## 2019-04-22 MED ORDER — MAGNESIUM SULFATE 2 GM/50ML IV SOLN
2.0000 g | Freq: Once | INTRAVENOUS | Status: AC
  Administered 2019-04-22: 2 g via INTRAVENOUS
  Filled 2019-04-22: qty 50

## 2019-04-22 MED ORDER — INSULIN ASPART 100 UNIT/ML ~~LOC~~ SOLN
0.0000 [IU] | SUBCUTANEOUS | Status: DC
  Administered 2019-04-22: 2 [IU] via SUBCUTANEOUS
  Administered 2019-04-22 (×2): 3 [IU] via SUBCUTANEOUS
  Administered 2019-04-22: 2 [IU] via SUBCUTANEOUS
  Administered 2019-04-23 (×2): 3 [IU] via SUBCUTANEOUS
  Administered 2019-04-23: 2 [IU] via SUBCUTANEOUS
  Administered 2019-04-23 – 2019-04-24 (×3): 3 [IU] via SUBCUTANEOUS
  Administered 2019-04-24 (×5): 2 [IU] via SUBCUTANEOUS
  Administered 2019-04-25: 3 [IU] via SUBCUTANEOUS
  Administered 2019-04-25: 2 [IU] via SUBCUTANEOUS
  Administered 2019-04-25: 3 [IU] via SUBCUTANEOUS
  Administered 2019-04-26 – 2019-04-27 (×5): 2 [IU] via SUBCUTANEOUS
  Administered 2019-04-27: 100 [IU] via SUBCUTANEOUS
  Administered 2019-04-27: 3 [IU] via SUBCUTANEOUS
  Administered 2019-04-27 – 2019-04-29 (×5): 2 [IU] via SUBCUTANEOUS

## 2019-04-22 MED ORDER — FAMOTIDINE 20 MG PO TABS
20.0000 mg | ORAL_TABLET | Freq: Two times a day (BID) | ORAL | Status: DC
  Administered 2019-04-22 – 2019-04-24 (×5): 20 mg via ORAL
  Filled 2019-04-22 (×5): qty 1

## 2019-04-22 MED ORDER — ETOMIDATE 2 MG/ML IV SOLN
INTRAVENOUS | Status: AC | PRN
  Administered 2019-04-22: 20 mg via INTRAVENOUS

## 2019-04-22 MED ORDER — ONDANSETRON HCL 4 MG/2ML IJ SOLN
INTRAMUSCULAR | Status: AC
  Administered 2019-04-22: 4 mg
  Filled 2019-04-22: qty 2

## 2019-04-22 MED ORDER — PROPOFOL 1000 MG/100ML IV EMUL
5.0000 ug/kg/min | INTRAVENOUS | Status: DC
  Administered 2019-04-22: 60 ug/kg/min via INTRAVENOUS
  Administered 2019-04-22: 65 ug/kg/min via INTRAVENOUS
  Administered 2019-04-22: 60 ug/kg/min via INTRAVENOUS
  Administered 2019-04-22: 5 ug/kg/min via INTRAVENOUS
  Administered 2019-04-22: 50 ug/kg/min via INTRAVENOUS
  Administered 2019-04-22: 60 ug/kg/min via INTRAVENOUS
  Administered 2019-04-22: 65 ug/kg/min via INTRAVENOUS
  Administered 2019-04-22: 55 ug/kg/min via INTRAVENOUS
  Administered 2019-04-23: 50 ug/kg/min via INTRAVENOUS
  Administered 2019-04-23: 60 ug/kg/min via INTRAVENOUS
  Administered 2019-04-23 (×2): 70 ug/kg/min via INTRAVENOUS
  Filled 2019-04-22 (×16): qty 100

## 2019-04-22 MED ORDER — PHENYLEPHRINE HCL-NACL 10-0.9 MG/250ML-% IV SOLN
INTRAVENOUS | Status: AC
  Filled 2019-04-22: qty 250

## 2019-04-22 MED ORDER — SUCCINYLCHOLINE CHLORIDE 20 MG/ML IJ SOLN
INTRAMUSCULAR | Status: AC | PRN
  Administered 2019-04-22: 150 mg via INTRAVENOUS

## 2019-04-22 MED ORDER — SODIUM CHLORIDE 0.9 % IV SOLN: 250.0000 mL | INTRAVENOUS | Status: DC

## 2019-04-22 MED ORDER — PRO-STAT SUGAR FREE PO LIQD
60.0000 mL | Freq: Four times a day (QID) | ORAL | Status: DC
  Administered 2019-04-22 – 2019-04-23 (×6): 60 mL via ORAL
  Filled 2019-04-22 (×5): qty 60

## 2019-04-22 MED ORDER — SODIUM CHLORIDE 0.9 % IV SOLN
3.0000 g | Freq: Four times a day (QID) | INTRAVENOUS | Status: DC
  Administered 2019-04-22 – 2019-04-29 (×28): 3 g via INTRAVENOUS
  Filled 2019-04-22 (×4): qty 3
  Filled 2019-04-22: qty 8
  Filled 2019-04-22 (×2): qty 3
  Filled 2019-04-22 (×2): qty 8
  Filled 2019-04-22 (×3): qty 3
  Filled 2019-04-22 (×2): qty 8
  Filled 2019-04-22: qty 3
  Filled 2019-04-22: qty 8
  Filled 2019-04-22: qty 3
  Filled 2019-04-22 (×2): qty 8
  Filled 2019-04-22 (×2): qty 3
  Filled 2019-04-22 (×2): qty 8
  Filled 2019-04-22 (×7): qty 3
  Filled 2019-04-22 (×2): qty 8

## 2019-04-22 MED ORDER — IPRATROPIUM-ALBUTEROL 0.5-2.5 (3) MG/3ML IN SOLN
3.0000 mL | RESPIRATORY_TRACT | Status: DC
  Administered 2019-04-22 – 2019-04-25 (×18): 3 mL via RESPIRATORY_TRACT
  Filled 2019-04-22 (×18): qty 3

## 2019-04-22 MED ORDER — SODIUM CHLORIDE 0.9 % IV SOLN
INTRAVENOUS | Status: AC | PRN
  Administered 2019-04-22: 1000 mL via INTRAVENOUS

## 2019-04-22 MED ORDER — SENNOSIDES 8.8 MG/5ML PO SYRP
5.0000 mL | ORAL_SOLUTION | Freq: Two times a day (BID) | ORAL | Status: DC | PRN
  Filled 2019-04-22: qty 5

## 2019-04-22 MED ORDER — PHENYLEPHRINE HCL-NACL 10-0.9 MG/250ML-% IV SOLN
25.0000 ug/min | INTRAVENOUS | Status: DC
  Administered 2019-04-23 (×2): 25 ug/min via INTRAVENOUS
  Filled 2019-04-22: qty 250

## 2019-04-22 MED ORDER — LACTATED RINGERS IV BOLUS
1000.0000 mL | Freq: Once | INTRAVENOUS | Status: AC
  Administered 2019-04-22: 1000 mL via INTRAVENOUS

## 2019-04-22 MED ORDER — FENTANYL CITRATE (PF) 100 MCG/2ML IJ SOLN
50.0000 ug | Freq: Once | INTRAMUSCULAR | Status: AC
  Administered 2019-04-22: 04:00:00 50 ug via INTRAVENOUS
  Filled 2019-04-22: qty 2

## 2019-04-22 MED ORDER — ACETAMINOPHEN 160 MG/5ML PO SOLN
650.0000 mg | Freq: Four times a day (QID) | ORAL | Status: DC | PRN
  Administered 2019-04-22 – 2019-04-23 (×3): 650 mg
  Filled 2019-04-22 (×3): qty 20.3

## 2019-04-22 MED ORDER — ORAL CARE MOUTH RINSE
15.0000 mL | OROMUCOSAL | Status: DC
  Administered 2019-04-22 – 2019-04-25 (×25): 15 mL via OROMUCOSAL

## 2019-04-22 MED ORDER — ENOXAPARIN SODIUM 40 MG/0.4ML ~~LOC~~ SOLN
40.0000 mg | SUBCUTANEOUS | Status: DC
  Administered 2019-04-22 – 2019-04-29 (×8): 40 mg via SUBCUTANEOUS
  Filled 2019-04-22 (×8): qty 0.4

## 2019-04-22 MED ORDER — VITAL HIGH PROTEIN PO LIQD: 1000.0000 mL | ORAL | Status: DC

## 2019-04-22 MED ORDER — SODIUM CHLORIDE 0.9 % IV BOLUS
1000.0000 mL | Freq: Once | INTRAVENOUS | Status: AC
  Administered 2019-04-22: 1000 mL via INTRAVENOUS

## 2019-04-22 MED ORDER — MIDAZOLAM HCL 2 MG/2ML IJ SOLN
INTRAMUSCULAR | Status: AC
  Filled 2019-04-22: qty 2

## 2019-04-22 MED ORDER — ROCURONIUM BROMIDE 50 MG/5ML IV SOLN
100.0000 mg | Freq: Once | INTRAVENOUS | Status: AC
  Administered 2019-04-22: 100 mg via INTRAVENOUS

## 2019-04-22 MED ORDER — CHLORHEXIDINE GLUCONATE 0.12% ORAL RINSE (MEDLINE KIT)
15.0000 mL | Freq: Two times a day (BID) | OROMUCOSAL | Status: DC
  Administered 2019-04-22 – 2019-04-29 (×10): 15 mL via OROMUCOSAL

## 2019-04-22 MED ORDER — CHLORHEXIDINE GLUCONATE CLOTH 2 % EX PADS
6.0000 | MEDICATED_PAD | Freq: Every day | CUTANEOUS | Status: DC
  Administered 2019-04-22 – 2019-04-25 (×4): 6 via TOPICAL

## 2019-04-22 MED ORDER — SODIUM CHLORIDE 0.9 % IV SOLN
3.0000 g | Freq: Once | INTRAVENOUS | Status: AC
  Administered 2019-04-22: 3 g via INTRAVENOUS
  Filled 2019-04-22: qty 8

## 2019-04-22 MED ORDER — IBUPROFEN 100 MG/5ML PO SUSP
400.0000 mg | Freq: Once | ORAL | Status: DC
  Filled 2019-04-22: qty 20

## 2019-04-22 MED ORDER — FENTANYL CITRATE (PF) 100 MCG/2ML IJ SOLN: 50.0000 ug | INTRAMUSCULAR | Status: DC | PRN

## 2019-04-22 MED ORDER — FENTANYL CITRATE (PF) 100 MCG/2ML IJ SOLN
50.0000 ug | INTRAMUSCULAR | Status: DC | PRN
  Administered 2019-04-23: 200 ug via INTRAVENOUS
  Administered 2019-04-24: 02:00:00 100 ug via INTRAVENOUS
  Filled 2019-04-22: qty 2
  Filled 2019-04-22: qty 4

## 2019-04-22 MED ORDER — MIDAZOLAM HCL 2 MG/2ML IJ SOLN
2.0000 mg | Freq: Once | INTRAMUSCULAR | Status: AC
  Administered 2019-04-22: 2 mg via INTRAVENOUS

## 2019-04-22 MED ORDER — VITAL HIGH PROTEIN PO LIQD
1000.0000 mL | ORAL | Status: DC
  Administered 2019-04-23: 1000 mL

## 2019-04-22 MED ORDER — DEXMEDETOMIDINE HCL IN NACL 200 MCG/50ML IV SOLN
0.4000 ug/kg/h | INTRAVENOUS | Status: DC
  Administered 2019-04-23: 0.9 ug/kg/h via INTRAVENOUS
  Administered 2019-04-23: 1 ug/kg/h via INTRAVENOUS
  Administered 2019-04-23: 0.5 ug/kg/h via INTRAVENOUS
  Filled 2019-04-22 (×3): qty 50

## 2019-04-22 MED ORDER — FENTANYL 2500MCG IN NS 250ML (10MCG/ML) PREMIX INFUSION
0.0000 ug/h | INTRAVENOUS | Status: DC
  Administered 2019-04-22: 04:00:00 50 ug/h via INTRAVENOUS
  Administered 2019-04-22: 250 ug/h via INTRAVENOUS
  Administered 2019-04-22: 05:00:00 50 ug/h via INTRAVENOUS
  Administered 2019-04-23: 25 ug/h via INTRAVENOUS
  Administered 2019-04-23: 375 ug/h via INTRAVENOUS
  Administered 2019-04-24: 225 ug/h via INTRAVENOUS
  Filled 2019-04-22 (×5): qty 250
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
## 2019-04-22 NOTE — ED Notes (Signed)
ED TO INPATIENT HANDOFF REPORT  Name/Age/Gender Perry Li 33 y.o. male  Code Status Code Status History    Date Active Date Inactive Code Status Order ID Comments User Context   05/11/2018 2030 05/14/2018 1819 Full Code [id number]  Angie Fava DO Inpatient   05/11/2018 2026 05/11/2018 2030 Full Code [id number]  Howerter, Chaney Born, DO Inpatient   Advance Care Planning Activity      Home/SNF/Other Home  Chief Complaint Respiratory failure [facility]) [J96.90]  Level of Care/Admitting Diagnosis ED Disposition    ED Disposition Condition Comment   Admit  Hospital Area: [facility] [[id number]]  Level of Care: ICU [6]  Covid Evaluation: Confirmed COVID Negative  Diagnosis: Respiratory failure [facility]) [[id number]]  Admitting Physician: Ples Specter [[id number]]  Attending Physician: Ples Specter [[id number]]  Estimated length of stay: past midnight tomorrow  Certification:: I certify this patient will need inpatient services for at least 2 midnights       Medical History Past Medical History:  Diagnosis Date  . Anxiety   . Chest pain   . High cholesterol   . Insomnia   . SOB (shortness of breath)     Allergies No Known Allergies  IV Location/Drains/Wounds Patient Lines/Drains/Airways Status   Active Line/Drains/Airways    Name:   Placement date:   Placement time:   Site:   Days:   Peripheral IV 04/22/19 Left Antecubital   04/22/19    0137    Antecubital   less than 1   Peripheral IV 04/22/19 Right Antecubital   04/22/19    0227    Antecubital   less than 1   Airway 7.5 mm   04/22/19    0152     less than 1          Labs/Imaging Results for orders placed or performed during the hospital encounter of 04/22/19 (from the past 48 hour(s))  Blood gas, arterial (at [facility])     Status: Abnormal   Collection Time: 04/22/19  1:59 AM  Result Value Ref Range   FIO2 100%    Delivery systems VENTILATOR    Mode PRVC    VT 620 mL   LHR 16 resp/min   Peep/cpap +5 cm H20   pH, Arterial 7.152 (LL) 7.350 - 7.450    Comment: CRITICAL RESULT CALLED TO, READ BACK BY AND VERIFIED WITH: C,HODGES AT 0305 ON 04/22/19 BY A,MOHAMED    pCO2 arterial 62.0 (H) 32.0 - 48.0 mmHg   pO2, Arterial 96.3 83.0 - 108.0 mmHg   Bicarbonate 20.6 20.0 - 28.0 mmol/L   Acid-base deficit 9.3 (H) 0.0 - 2.0 mmol/L   O2 Saturation 94.4 %   Patient temperature 99.7    Drawn by [id number] AT 0230    Allens test (pass/fail) PASS PASS    Comment: Performed at [facility], [address], [place], [place] [address]  Respiratory Panel by RT PCR (Flu A&B, Covid) - Nasopharyngeal Swab     Status: None   Collection Time: 04/22/19  1:59 AM   Specimen: Nasopharyngeal Swab  Result Value Ref Range   SARS Coronavirus 2 by RT PCR NEGATIVE NEGATIVE    Comment: (NOTE) SARS-CoV-2 target nucleic acids are NOT DETECTED. The SARS-CoV-2 RNA is generally detectable in upper respiratoy specimens during the acute phase of infection. The lowest concentration of SARS-CoV-2 viral copies this assay can detect is 131 copies/mL. A negative result does not preclude SARS-Cov-2 infection and should not be used  as the sole basis for treatment or other patient management decisions. A negative result may occur with  improper specimen collection/handling, submission of specimen other than nasopharyngeal swab, presence of viral mutation(s) within the areas targeted by this assay, and inadequate number of viral copies (<131 copies/mL). A negative result must be combined with clinical observations, patient history, and epidemiological information. The expected result is Negative. Fact Sheet for Patients:  https://www.moore.com/ Fact Sheet for Healthcare Providers:  https://www.young.biz/ This test is not yet ap proved or cleared by the [place] FDA and  has been authorized for detection and/or diagnosis of SARS-CoV-2 by FDA under an  Emergency Use Authorization (EUA). This EUA will remain  in effect (meaning this test can be used) for the duration of the COVID-19 declaration under Section 564(b)(1) of the Act, 21 U.S.C. section 360bbb-3(b)(1), unless the authorization is terminated or revoked sooner.    Influenza A by PCR NEGATIVE NEGATIVE   Influenza B by PCR NEGATIVE NEGATIVE    Comment: (NOTE) The Xpert Xpress SARS-CoV-2/FLU/RSV assay is intended as an aid in  the diagnosis of influenza from Nasopharyngeal swab specimens and  should not be used as a sole basis for treatment. Nasal washings and  aspirates are unacceptable for Xpert Xpress SARS-CoV-2/FLU/RSV  testing. Fact Sheet for Patients: https://www.moore.com/ Fact Sheet for Healthcare Providers: https://www.young.biz/ This test is not yet approved or cleared by the [place] FDA and  has been authorized for detection and/or diagnosis of SARS-CoV-2 by  FDA under an Emergency Use Authorization (EUA). This EUA will remain  in effect (meaning this test can be used) for the duration of the  Covid-19 declaration under Section 564(b)(1) of the Act, 21  U.S.C. section 360bbb-3(b)(1), unless the authorization is  terminated or revoked. Performed at [facility], [address], [place], [place] [address]   CBC with Differential     Status: Abnormal   Collection Time: 04/22/19  2:29 AM  Result Value Ref Range   WBC 10.2 4.0 - 10.5 K/uL   RBC 5.45 4.22 - 5.81 MIL/uL   Hemoglobin 16.6 13.0 - 17.0 g/dL   HCT 60.4 (H) 54.0 - 98.1 %   MCV 96.7 80.0 - 100.0 fL   MCH 30.5 26.0 - 34.0 pg   MCHC 31.5 30.0 - 36.0 g/dL   RDW 19.1 47.8 - 29.5 %   Platelets 361 150 - 400 K/uL   nRBC 0.0 0.0 - 0.2 %   Neutrophils Relative % 73 %   Neutro Abs 7.5 1.7 - 7.7 K/uL   Lymphocytes Relative 22 %   Lymphs Abs 2.2 0.7 - 4.0 K/uL   Monocytes Relative 2 %   Monocytes Absolute 0.2 0.1 - 1.0 K/uL   Eosinophils Relative  1 %   Eosinophils Absolute 0.1 0.0 - 0.5 K/uL   Basophils Relative 1 %   Basophils Absolute 0.1 0.0 - 0.1 K/uL   Immature Granulocytes 1 %   Abs Immature Granulocytes 0.05 0.00 - 0.07 K/uL    Comment: Performed at [facility], [address], [place], [place] [address]  Comprehensive metabolic panel     Status: Abnormal   Collection Time: 04/22/19  2:29 AM  Result Value Ref Range   Sodium 138 135 - 145 mmol/L   Potassium 5.7 (H) 3.5 - 5.1 mmol/L   Chloride 102 98 - 111 mmol/L   CO2 24 22 - 32 mmol/L   Glucose, Bld 221 (H) 70 - 99 mg/dL   BUN 15  6 - 20 mg/dL   Creatinine, Ser 1.911.19 0.61 - 1.24 mg/dL   Calcium 8.0 (L) 8.9 - 10.3 mg/dL   Total Protein 7.5 6.5 - 8.1 g/dL   Albumin 4.3 3.5 - 5.0 g/dL   AST 59 (H) 15 - 41 U/L   ALT 102 (H) 0 - 44 U/L   Alkaline Phosphatase 61 38 - 126 U/L   Total Bilirubin 0.5 0.3 - 1.2 mg/dL   GFR calc non Af Amer >60 >60 mL/min   GFR calc Af Amer >60 >60 mL/min   Anion gap 12 5 - 15    Comment: Performed at [facility], [address] [place], [place] [address]  Ethanol     Status: Abnormal   Collection Time: 04/22/19  2:29 AM  Result Value Ref Range   Alcohol, Ethyl (B) 61 (H) <10 mg/dL    Comment: (NOTE) Lowest detectable limit for serum alcohol is 10 mg/dL. For medical purposes only. Performed at [facility], [address] [place], [place] [address]   Salicylate level     Status: None   Collection Time: 04/22/19  2:29 AM  Result Value Ref Range   Salicylate Lvl <7.0 2.8 - 30.0 mg/dL    Comment: Performed at [facility], [address] [place], [place] [address]  Acetaminophen level     Status: Abnormal   Collection Time: 04/22/19  2:29 AM  Result Value Ref Range   Acetaminophen (Tylenol), Serum <10 (L) 10 - 30 ug/mL    Comment: (NOTE) Therapeutic concentrations vary significantly. A range of 10-30 ug/mL  may be an effective concentration for many  patients. However, some  are best treated at concentrations outside of this range. Acetaminophen concentrations >150 ug/mL at 4 hours after ingestion  and >50 ug/mL at 12 hours after ingestion are often associated with  toxic reactions. Performed at [facility], [address] [place], [place] [address]    DG Abdomen 1 View  Result Date: 04/22/2019 CLINICAL DATA:  Orogastric tube placement. EXAM: ABDOMEN - 1 VIEW COMPARISON:  None. FINDINGS: Tip and side port of the enteric tube below the diaphragm in the stomach. No bowel dilatation in the upper abdomen. IMPRESSION: Tip and side port of the enteric tube below the diaphragm in the stomach. Electronically Signed   By: Narda RutherfordMelanie  Sanford M.D.   On: 04/22/2019 02:15   DG Chest Portable 1 View  Result Date: 04/22/2019 CLINICAL DATA:  Intubation. Orogastric tube placement. EXAM: PORTABLE CHEST 1 VIEW COMPARISON:  None. FINDINGS: Endotracheal tube tip at the thoracic inlet. Enteric tube in place tip below the diaphragm not included in the field of view. Symmetric low lung volumes. Confluent bilateral perihilar opacities. Heart is normal in size. No pleural fluid or pneumothorax. No acute osseous abnormalities are seen. IMPRESSION: 1. Endotracheal tube tip at the thoracic inlet. Enteric tube in place tip below the diaphragm not included in the field of view. 2. Confluent bilateral perihilar opacities, favor aspiration/pneumonia over pulmonary edema. Electronically Signed   By: Narda RutherfordMelanie  Sanford M.D.   On: 04/22/2019 02:15    Pending Labs Unresulted Labs (From admission, onward)    Start     Ordered   04/22/19 0402  CK  Once,   STAT     04/22/19 0401   04/22/19 0200  Blood culture (routine x 2)  BLOOD CULTURE X 2,   STAT     04/22/19 0159   04/22/19 0158  Rapid urine  drug screen (hospital performed)  ONCE - STAT,   STAT     04/22/19 0159   Signed and Held  Comprehensive metabolic panel  Daily,   R     Signed and Held   Signed  and Held  Magnesium  Daily,   R     Signed and Held   Signed and Held  Phosphorus  Daily,   R     Signed and Held   Signed and Held  CBC  Daily,   R     Signed and Held   Signed and Held  Protime-INR  Once,   R     Signed and Held   Signed and Held  Culture, blood (routine x 2)  BLOOD CULTURE X 2,   R     Signed and Held   Signed and Held  Culture, respiratory (tracheal aspirate)  Once,   R     Signed and Held   Signed and Held  Blood gas, venous  Once,   R     Signed and Held          Vitals/Pain Today's Vitals   04/22/19 0315 04/22/19 0330 04/22/19 0345 04/22/19 0400  BP: (!) 129/55 (!) 130/54 (!) 137/99 127/74  Pulse: (!) 133 (!) 135 (!) 139 (!) 142  Resp: (!) 26 (!) 25 (!) 29 19  Temp:      TempSrc:      SpO2: 99% 99% 92% 92%  Weight:      Height:        Isolation Precautions No active isolations  Medications Medications  propofol (DIPRIVAN) 1000 MG/100ML infusion (55 mcg/kg/min  118.8 kg Intravenous New Bag/Given 04/22/19 0400)  fentaNYL 2566mcg in NS 224mL (76mcg/ml) infusion-PREMIX (50 mcg/hr Intravenous New Bag/Given 04/22/19 0354)  ondansetron (ZOFRAN) 4 MG/2ML injection (4 mg  Given 04/22/19 0146)  Ampicillin-Sulbactam (UNASYN) 3 g in sodium chloride 0.9 % 100 mL IVPB ( Intravenous Stopped 04/22/19 0310)  sodium chloride 0.9 % bolus 1,000 mL (0 mLs Intravenous Stopped 04/22/19 0312)  0.9 %  sodium chloride infusion (1,000 mLs Intravenous New Bag/Given 04/22/19 0142)  etomidate (AMIDATE) injection (20 mg Intravenous Given 04/22/19 0147)  succinylcholine (ANECTINE) injection (150 mg Intravenous Given 04/22/19 0148)  fentaNYL (SUBLIMAZE) injection 50 mcg (50 mcg Intravenous Given 04/22/19 0349)    Mobility walks

## 2019-04-22 NOTE — Progress Notes (Signed)
[facility] Progress Note Patient Name: Quasean Frye DOB: 09-Aug-1985 MRN: [id number]   Date of Service  04/22/2019  HPI/Events of Note  Fever to 101.2 F - Request for Tylenol. AST and ALT both elevated, therefore, can't use Tylenol. Creatinine = 1.19.  eICU Interventions  Will order: 1. Motrin Suspension 400 mg per tube X 1.      Intervention Category Major Interventions: Infection - evaluation and management  Alilah Mcmeans Eugene 04/22/2019, 5:52 AM

## 2019-04-22 NOTE — ED Provider Notes (Signed)
[facility] DEPT Provider Note   CSN: [id number] Arrival date & time: 04/22/19  0118     History Chief Complaint  Patient presents with  . Drug Overdose    Perry Li is a 33 y.o. male.  HPI     This is a 33 year old male with a history of high cholesterol, shortness of breath, diabetes who presents with reported overdose.  Per EMS he was found by family.  He was evaluated by fire and noted to have pinpoint pupils.  He was given 4 mg of Narcan for pupils and slowed respiratory rate.  After Narcan patient began to vomit and became agitated.  He required suctioning.  EMS noted O2 sats in the 70s.  He was placed on a nonrebreather.  Unclear ingestion but reportedly snorted trazodone and some other unknown substance.  Also reportedly drank alcohol.  Level 5 caveat for patient mental status and acuity of condition  Past Medical History:  Diagnosis Date  . Anxiety   . Chest pain   . High cholesterol   . Insomnia   . SOB (shortness of breath)     Patient Active Problem List   Diagnosis Date Noted  . Respiratory failure ([place]) 04/22/2019  . 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 Elevated LFTs 05/23/2018  . Hyperglycemia 05/12/2018  . DKA (diabetic ketoacidoses) ([place]) 05/11/2018  . Right Achilles tendinitis 08/15/2014  . High cholesterol   . Anxiety   . Insomnia     Past Surgical History:  Procedure Laterality Date  . NO PAST SURGERIES         Family History  Problem Relation Age of Onset  . Healthy Mother   . Hypertension Father   . Diabetes Maternal Grandmother     Social History   Tobacco Use  . Smoking status: Never Smoker  . Smokeless tobacco: Never Used  Substance Use Topics  . Alcohol use: Yes    Alcohol/week: 0.0 standard drinks    Comment: occasional  . Drug use: No    Home Medications Prior to Admission medications   Medication Sig Start Date End Date Taking? Authorizing Provider  blood  glucose meter kit and supplies KIT Dispense based on patient and insurance preference. Use up to four times daily as directed. (FOR ICD-9 250.00, 250.01). 05/14/18   Kayleen Memos, DO  blood glucose meter kit and supplies Dispense based on patient and insurance preference. Use up to twice daily as directed. (FOR ICD-10 E10.9, E11.9). 05/11/18   Copland, Gay Filler, MD  clonazePAM (KLONOPIN) 1 MG tablet TAKE 1 TABLET(1 MG) BY MOUTH Three times DAILY. Patient taking differently: Take 1 mg by mouth 3 (three) times daily.  04/16/19   Copland, Gay Filler, MD  metFORMIN (GLUCOPHAGE XR) 500 MG 24 hr tablet Take 1 tablet (500 mg total) by mouth daily with breakfast. 04/16/19   Copland, Gay Filler, MD  traZODone (DESYREL) 150 MG tablet TAKE 1 TABLET BY MOUTH AT BEDTIME Patient not taking: Reported on 04/22/2019 10/13/18   Copland, Gay Filler, MD  traZODone (DESYREL) 50 MG tablet Take 2 tablets (100 mg total) by mouth at bedtime as needed for sleep. 10/12/18   Copland, Gay Filler, MD    Allergies    Patient has no known allergies.  Review of Systems   Review of Systems  Unable to perform ROS: Acuity of condition    Physical Exam Updated Vital Signs BP 137/69   Pulse (!) 142   Temp 99.7 F (37.6 C) (  Oral)   Resp 20   Ht 1.88 m ('6\' 2"' )   Wt 118 kg   SpO2 92%   BMI 33.40 kg/m   Physical Exam Vitals and nursing note reviewed.  Constitutional:      Appearance: He is well-developed.     Comments: Somnolent, minimally arousable to voice, will not answer questions or follow commands, intermittently agitated, notably tachypneic  HENT:     Head: Normocephalic and atraumatic.     Mouth/Throat:     Mouth: Mucous membranes are dry.  Eyes:     Pupils: Pupils are equal, round, and reactive to light.     Comments: Pupils 3 mm reactive bilaterally  Cardiovascular:     Rate and Rhythm: Regular rhythm. Tachycardia present.     Heart sounds: Normal heart sounds. No murmur.  Pulmonary:     Effort: Respiratory  distress present.     Breath sounds: Rales present. No wheezing.     Comments: Coarse breath sounds in all lung fields, Rales present, tachypnea with increased work of breathing Abdominal:     General: Bowel sounds are normal.     Palpations: Abdomen is soft.     Tenderness: There is no abdominal tenderness. There is no rebound.  Musculoskeletal:     Cervical back: Neck supple.     Right lower leg: No edema.     Left lower leg: No edema.  Lymphadenopathy:     Cervical: No cervical adenopathy.  Skin:    General: Skin is warm and dry.  Neurological:     Comments: Unable to assess orientation, appears to move all 4 extremities  Psychiatric:     Comments: Unable to assess     ED Results / Procedures / Treatments   Labs (all labs ordered are listed, but only abnormal results are displayed) Labs Reviewed  CBC WITH DIFFERENTIAL/PLATELET - Abnormal; Notable for the following components:      Result Value   HCT 52.7 (*)    All other components within normal limits  COMPREHENSIVE METABOLIC PANEL - Abnormal; Notable for the following components:   Potassium 5.7 (*)    Glucose, Bld 221 (*)    Calcium 8.0 (*)    AST 59 (*)    ALT 102 (*)    All other components within normal limits  ETHANOL - Abnormal; Notable for the following components:   Alcohol, Ethyl (B) 61 (*)    All other components within normal limits  BLOOD GAS, ARTERIAL - Abnormal; Notable for the following components:   pH, Arterial 7.152 (*)    pCO2 arterial 62.0 (*)    Acid-base deficit 9.3 (*)    All other components within normal limits  ACETAMINOPHEN LEVEL - Abnormal; Notable for the following components:   Acetaminophen (Tylenol), Serum <10 (*)    All other components within normal limits  RESPIRATORY PANEL BY RT PCR (FLU A&B, COVID)  CULTURE, BLOOD (ROUTINE X 2)  CULTURE, BLOOD (ROUTINE X 2)  SALICYLATE LEVEL  RAPID URINE DRUG SCREEN, HOSP PERFORMED  CK  CBG MONITORING, ED    EKG EKG  Interpretation  Date/Time:  Sunday April 22 2019 01:35:57 EST Ventricular Rate:  130 PR Interval:    QRS Duration: 93 QT Interval:  316 QTC Calculation: 465 R Axis:   75 Text Interpretation: Sinus tachycardia Nonspecific T abnormalities, inferior leads No significant change since last tracing Confirmed by Thayer Jew [phone]) on 04/22/2019 2:08:20 AM   Radiology DG Abdomen 1 View  Result Date: 04/22/2019  CLINICAL DATA:  Orogastric tube placement. EXAM: ABDOMEN - 1 VIEW COMPARISON:  None. FINDINGS: Tip and side port of the enteric tube below the diaphragm in the stomach. No bowel dilatation in the upper abdomen. IMPRESSION: Tip and side port of the enteric tube below the diaphragm in the stomach. Electronically Signed   By: Keith Rake M.D.   On: 04/22/2019 02:15   DG Chest Portable 1 View  Result Date: 04/22/2019 CLINICAL DATA:  Intubation. Orogastric tube placement. EXAM: PORTABLE CHEST 1 VIEW COMPARISON:  None. FINDINGS: Endotracheal tube tip at the thoracic inlet. Enteric tube in place tip below the diaphragm not included in the field of view. Symmetric low lung volumes. Confluent bilateral perihilar opacities. Heart is normal in size. No pleural fluid or pneumothorax. No acute osseous abnormalities are seen. IMPRESSION: 1. Endotracheal tube tip at the thoracic inlet. Enteric tube in place tip below the diaphragm not included in the field of view. 2. Confluent bilateral perihilar opacities, favor aspiration/pneumonia over pulmonary edema. Electronically Signed   By: Keith Rake M.D.   On: 04/22/2019 02:15    Procedures .Critical Care Performed by: Merryl Hacker, MD Authorized by: Merryl Hacker, MD   Critical care provider statement:    Critical care time (minutes):  60   Critical care time was exclusive of:  Separately billable procedures and treating other patients   Critical care was necessary to treat or prevent imminent or life-threatening  deterioration of the following conditions: Respiratory failure.   Critical care was time spent personally by me on the following activities:  Discussions with consultants, evaluation of patient's response to treatment, examination of patient, ordering and performing treatments and interventions, ordering and review of laboratory studies, ordering and review of radiographic studies, pulse oximetry, re-evaluation of patient's condition, obtaining history from patient or surrogate and review of old charts Procedure Name: Intubation Date/Time: 04/22/2019 2:30 AM Performed by: Merryl Hacker, MD Pre-anesthesia Checklist: Patient identified Oxygen Delivery Method: Non-rebreather mask Induction Type: Rapid sequence Laryngoscope Size: Glidescope and 4 Grade View: Grade I Tube size: 7.5 mm Number of attempts: 1 Secured at: 22 cm Tube secured with: ETT holder      (including critical care time)  Medications Ordered in ED Medications  propofol (DIPRIVAN) 1000 MG/100ML infusion (55 mcg/kg/min  118.8 kg Intravenous New Bag/Given 04/22/19 0400)  fentaNYL 2566mg in NS 2527m(1049mml) infusion-PREMIX (50 mcg/hr Intravenous New Bag/Given 04/22/19 0354)  ondansetron (ZOFRAN) 4 MG/2ML injection (4 mg  Given 04/22/19 0146)  Ampicillin-Sulbactam (UNASYN) 3 g in sodium chloride 0.9 % 100 mL IVPB ( Intravenous Stopped 04/22/19 0310)  sodium chloride 0.9 % bolus 1,000 mL (0 mLs Intravenous Stopped 04/22/19 0312)  0.9 %  sodium chloride infusion (1,000 mLs Intravenous New Bag/Given 04/22/19 0142)  etomidate (AMIDATE) injection (20 mg Intravenous Given 04/22/19 0147)  succinylcholine (ANECTINE) injection (150 mg Intravenous Given 04/22/19 0148)  fentaNYL (SUBLIMAZE) injection 50 mcg (50 mcg Intravenous Given 04/22/19 0349)    ED Course  I have reviewed the triage vital signs and the nursing notes.  Pertinent labs & imaging results that were available during my care of the patient were reviewed by me  and considered in my medical decision making (see chart for details).    MDM Rules/Calculators/A&P                      Patient presents with possible overdose.  On my evaluation he is somnolent minimally arousable.  He has increased work of  breathing and his O2 sats are in the 60s without oxygen support.  He was placed on a nonrebreather and O2 sats slowly rebounded to 90%.  However, he remained tachypneic with increased work of breathing and a respiratory rate in the 40s.  Highly suspicious for aspiration.  I suspect he acutely withdrew upon administration of Narcan causing vomiting and aspiration.  This is also in the setting of alcohol use which is likely contributory as well.  Patient was intubated for airway control.  He was noted to have precipitous drop in his O2 sats during intubation but was able to be intubated quickly and O2 sats rebounded with aggressive bagging.  Patient was started on a propofol drip for sedation.  Chest x-ray and abdominal film reviewed by myself and with good placement of apparatus.  There is suspicion for aspiration.  Patient was given Unasyn and blood cultures were obtained.   Final Clinical Impression(s) / ED Diagnoses Final diagnoses:  Drug overdose, undetermined intent, initial encounter  Aspiration pneumonia due to vomit, unspecified laterality, unspecified part of lung ([place])  Acute respiratory failure with hypoxia [facility])    Rx / DC Orders ED Discharge Orders    None       Merryl Hacker, MD 04/22/19 [phone]

## 2019-04-22 NOTE — Progress Notes (Addendum)
NAME:  Perry Li, MRN:  [id number], DOB:  Jul 15, 1985, LOS: 0 ADMISSION DATE:  04/22/2019, CONSULTATION DATE: 04/22/2019 REFERRING MD: [facility], CHIEF COMPLAINT: Acute respiratory failure  Brief History   33 year old male with a history of diabetes, anxiety, and high cholesterol who presented after being found after a suspected drug overdose, received Narcan, and then had an aspiration event leading to acute hypoxia.  Intubated for hypoxic respiratory failure and airway protection.  Admitting to ICU, [facility].    History of present illness   History was taken from chart review.  Patient was found to be blue and not breathing for 2 to 5 minutes before [employer] fire arrived.  He was given 4 mg of Narcan, and afterward patient vomited and became agitated.  He then became hypoxic on transfer and was placed on a nonrebreather.  He was then intubated in the ED.  Reportedly patient snorts trazodone, alcohol level was positive here.  Pulmonary critical care was asked to admit patient to [facility].  Patient was given Unasyn for concern for aspiration pneumonia in the ED.  At the bedside patient is sedated with propofol, appears uncomfortable on the vent.  Though he is hemodynamically stable except for tachycardia with heart rate in the 130s.    Past Medical History   Past Medical History:  Diagnosis Date  . Anxiety   . Chest pain   . High cholesterol   . Insomnia   . SOB (shortness of breath)      Significant Hospital Events   Intubated 12/20  Consults:  Critical care  Procedures:  Intubation 12/20  Significant Diagnostic Tests:  Chest x-ray 12/20-Central bilateral infiltrates  Micro Data:  12/20 blood cultures x2 >> Covid PCR negative Flu a and B negative Trach aspirate 12/20>>   Antimicrobials:  Unasyn, 12/20>>  Interim history/subjective:  Requiring four-point restraints due to periodic agitation.  Still febrile.  Objective   Blood pressure 126/72,  pulse (!) 135, temperature (!) 101.8 F (38.8 C), temperature source Oral, resp. rate 14, height '6\' 2"'  (1.88 m), weight 119.3 kg, SpO2 94 %.    Vent Mode: PRVC FiO2 (%):  [80 %-100 %] 80 % Set Rate:  [16 bmp-22 bmp] 22 bmp Vt Set:  [550 mL-620 mL] 550 mL PEEP:  [5 cmH20-10 cmH20] 10 cmH20 Plateau Pressure:  [20 cmH20-33 cmH20] 33 cmH20   Intake/Output Summary (Last 24 hours) at 04/22/2019 1028 Last data filed at 04/22/2019 0800 Gross per 24 hour  Intake 1378.13 ml  Output --  Net 1378.13 ml   Filed Weights   04/22/19 0205 04/22/19 0500  Weight: 118 kg 119.3 kg    Examination: General: Critically ill-appearing man laying in bed in no acute distress, intubated and heavily sedated.   HENT: Symmetric small pupils, reactive.  ETT and OG tube in place Lungs: Bilateral rhonchi with significant blood-tinged purulent secretions.  No wheezing. Cardiovascular: Tachycardic, regular rhythm, no murmurs Abdomen: Soft, nondistended, nontender Extremities: No peripheral edema, no cyanosis Neuro: RASS -5, pupils small but reactive GU: Condom cath in place draining clear yellow urine  EKG reviewed-sinus tachycardia, normal intervals, T wave inversion in lead III  Resolved Hospital Problem list     Assessment & Plan:  33 year old male with a history of diabetes, hyperlipidemia, anxiety who presented with a acute encephalopathy due to a drug overdose which responded to Narcan.  He subsequently had emesis with aspiration causing respiratory failure.  Intubated for acute hypoxic respiratory failure secondary to aspiration pneumonia.  #  Acute hypoxic and hypercarbic respiratory failure due to drug overdose and aspiration pneumonia. ARDS (P:F 116) -Continue low tidal volume ventilation, 6 -8 cc/kg ideal body weight with goal plateau less than 30 and driving pressure less than 15 -Sedation as required to maintain tolerance, goal 0 to -1 -If he continues to have high vent requirements, may require  prone positioning -Continue Unasyn -Follow trach aspirate cultures -VAP prevention protocol -Famotidine for GI prophylaxis -Goal net negative fluid balance for ARDS  #Drug overdose: Benzo or trazodone overdose, ethanol -Propofol and fentanyl for sedation -Likely this was an accidental overdose, but will determine this once he is able to be extubated -patient's mother is requesting his sister to text her photos of his prescription medications at home  #Diabetes: History of DKA, not on insulin at home.  On PTA Metformin -Continue holding PTA Metformin -Accu-Cheks every 4 hours with sliding scale insulin.  I anticipate he will require long-acting insulin. -Goal BG 140-180 while admitted to the ICU  Upper clammy resolved -Continue to monitor  AKI, likely prerenal -LR bolus -Continue to monitor -UA  Elevated transaminase levels-chronic -Continue to monitor -Likely needs outpatient evaluation for NASH versus other causes  Best practice:  Diet: TF Pain/Anxiety/Delirium protocol (if indicated): Yes VAP protocol (if indicated): Yes DVT prophylaxis: Lovenox GI prophylaxis: Pepcid Glucose control: SSI Mobility: Bedrest Code Status: Full Family Communication: mother updated at bedside Disposition: ICU  Labs   CBC: Recent Labs  Lab 04/16/19 1013 04/22/19 0229 04/22/19 0609  WBC 7.8 10.2 8.7  NEUTROABS  --  7.5  --   HGB 15.1 16.6 16.9  HCT 45.5 52.7* 53.6*  MCV 92.0 96.7 95.9  PLT 255.0 361 473    Basic Metabolic Panel: Recent Labs  Lab 04/16/19 1013 04/22/19 0229 04/22/19 0609  NA 141 138 140  K 4.3 5.7* 4.7  CL 106 102 105  CO2 '25 24 22  ' GLUCOSE 160* 221* 110*  BUN '17 15 17  ' CREATININE 1.01 1.19 1.56*  CALCIUM 9.3 8.0* 8.2*  MG  --   --  1.8  PHOS  --   --  4.4   GFR: Estimated Creatinine Clearance: 94.1 mL/min (A) (by C-G formula based on SCr of 1.56 mg/dL (H)). Recent Labs  Lab 04/16/19 1013 04/22/19 0229 04/22/19 0609  WBC 7.8 10.2 8.7     Liver Function Tests: Recent Labs  Lab 04/16/19 1013 04/22/19 0229 04/22/19 0609  AST 32 59* 54*  ALT 81* 102* 95*  ALKPHOS 62 61 57  BILITOT 0.4 0.5 0.4  PROT 7.1 7.5 6.8  ALBUMIN 4.7 4.3 4.2   No results for input(s): LIPASE, AMYLASE in the last 168 hours. No results for input(s): AMMONIA in the last 168 hours.  ABG    Component Value Date/Time   PHART 7.152 (LL) 04/22/2019 0159   PCO2ART 62.0 (H) 04/22/2019 0159   PO2ART 96.3 04/22/2019 0159   HCO3 24.8 04/22/2019 0950   TCO2 12 (L) 05/11/2018 1545   ACIDBASEDEF 4.4 (H) 04/22/2019 0950   O2SAT 96.3 04/22/2019 0950     Coagulation Profile: Recent Labs  Lab 04/22/19 0609  INR 0.9    Cardiac Enzymes: Recent Labs  Lab 04/22/19 0609  CKTOTAL 271    HbA1C: Hgb A1c MFr Bld  Date/Time Value Ref Range Status  04/16/2019 10:13 AM 5.8 4.6 - 6.5 % Final    Comment:    Glycemic Control Guidelines for People with Diabetes:Non Diabetic:  <6%Goal of Therapy: <7%Additional Action Suggested:  >8%  05/12/2018 05:12 AM 12.1 (H) 4.8 - 5.6 % Final    Comment:    (NOTE) Pre diabetes:          5.7%-6.4% Diabetes:              >6.4% Glycemic control for   <7.0% adults with diabetes     CBG: Recent Labs  Lab 04/22/19 0516 04/22/19 0746  GLUCAP 104* 135*    Review of Systems:   Unable to obtain review of systems due to intubated status  Past Medical History  He,  has a past medical history of Anxiety, Chest pain, High cholesterol, Insomnia, and SOB (shortness of breath).   Surgical History    Past Surgical History:  Procedure Laterality Date  . NO PAST SURGERIES       Social History   reports that he has never smoked. He has never used smokeless tobacco. He reports current alcohol use. He reports that he does not use drugs.   Family History   His family history includes Diabetes in his maternal grandmother; Healthy in his mother; Hypertension in his father.   Allergies No Known Allergies   Home  Medications  Prior to Admission medications   Medication Sig Start Date End Date Taking? Authorizing Provider  blood glucose meter kit and supplies KIT Dispense based on patient and insurance preference. Use up to four times daily as directed. (FOR ICD-9 250.00, 250.01). 05/14/18   Kayleen Memos, DO  blood glucose meter kit and supplies Dispense based on patient and insurance preference. Use up to twice daily as directed. (FOR ICD-10 E10.9, E11.9). 05/11/18   Copland, Gay Filler, MD  clonazePAM (KLONOPIN) 1 MG tablet TAKE 1 TABLET(1 MG) BY MOUTH Three times DAILY. Patient taking differently: Take 1 mg by mouth 3 (three) times daily.  04/16/19   Copland, Gay Filler, MD  metFORMIN (GLUCOPHAGE XR) 500 MG 24 hr tablet Take 1 tablet (500 mg total) by mouth daily with breakfast. 04/16/19   Copland, Gay Filler, MD  traZODone (DESYREL) 150 MG tablet TAKE 1 TABLET BY MOUTH AT BEDTIME Patient not taking: Reported on 04/22/2019 10/13/18   Copland, Gay Filler, MD  traZODone (DESYREL) 50 MG tablet Take 2 tablets (100 mg total) by mouth at bedtime as needed for sleep. 10/12/18   Copland, Gay Filler, MD     This patient is critically ill with multiple organ system failure which requires frequent high complexity decision making, assessment, support, evaluation, and titration of therapies. This was completed through the application of advanced monitoring technologies and extensive interpretation of multiple databases. During this encounter critical care time was devoted to patient care services described in this note for 50 minutes.    I updated his sister Wyatt Portela on the phone. She reported that he has a habit of buying xanax and percocet off the streets. His friends reported that they think he crushed percocet and took that last night in addition to his prescribed trazodone and clonazepam. His mother had indicated earlier that he has never expressed suicidal ideation, but he has been trying to treat his anxiety and  insomnia.  Julian Hy, DO 04/22/19 1:52 PM [facility]

## 2019-04-22 NOTE — Progress Notes (Signed)
Pts. head repositioned w/o incident to facing L side, ET remains @26  cm, pt. remains proned, RT to monitor.

## 2019-04-22 NOTE — Progress Notes (Signed)
Lab notified of ABG sent to be analyzed.

## 2019-04-22 NOTE — Progress Notes (Signed)
ABG    Component Value Date/Time   PHART 7.139 (LL) 04/22/2019 1600   PCO2ART 69.1 (HH) 04/22/2019 1600   PO2ART 87.9 04/22/2019 1600   HCO3 22.5 04/22/2019 1600   TCO2 12 (L) 05/11/2018 1545   ACIDBASEDEF 10.4 (H) 04/22/2019 1600   O2SAT 94.8 04/22/2019 1600    P:F 146 Planning for proning this afternoon x 16 hours  Julian Hy, DO 04/22/19 4:40 PM [facility]

## 2019-04-22 NOTE — ED Notes (Signed)
Date and time results received: 04/22/19 3:11 AM  (use smartphrase ".now" to insert current time)  Test: pH Critical Value:7.152  Name of Provider Notified: Dr.Horton  Orders Received? Or Actions Taken?:

## 2019-04-22 NOTE — Sedation Documentation (Signed)
7.5 tube in @ 0149 / 23 @ teeth  Color change @ 0150  OG in @ 8546

## 2019-04-22 NOTE — Progress Notes (Signed)
Initial Nutrition Assessment  DOCUMENTATION CODES:   Obesity unspecified  INTERVENTION:   -Vital HP @ 20 ml/hr via OGT -60 ml Prostat QID -Provides 1280 kcals, 162g protein and 401 ml H2O.  -Liquid MVI per tube  NUTRITION DIAGNOSIS:   Inadequate oral intake related to inability to eat as evidenced by NPO status.  GOAL:   Provide needs based on ASPEN/SCCM guidelines  MONITOR:   Vent status, Labs, Weight trends, TF tolerance, I & O's  REASON FOR ASSESSMENT:   Consult, Ventilator Enteral/tube feeding initiation and management  ASSESSMENT:   33 year old male with a history of diabetes, anxiety, and high cholesterol who presented after being found after a suspected drug overdose, received Narcan, and then had an aspiration event leading to acute hypoxia.  Intubated for hypoxic respiratory failure and airway protection.  Admitting to [facility], [facility].  **RD working remotely**  Pt currently sedated with OGT. Intubated today following emesis which pt aspirated causing respiratory failure.  Per chart review, pt was snorting trazodone and another substance along with drinking ETOH PTA.  Patient is currently intubated on ventilator support MV: 14.8 L/min Temp (24hrs), Avg:100.9 F (38.3 C), Min:99.7 F (37.6 C), Max:101.8 F (38.8 C)  Propofol: 35.6 ml/hr -providing 939 fat kcals  Per weight records, no weight loss noted.  Medications: Precedex infusion, Lactated Ringers bolus,  Labs reviewed: CBGs: 104-135  NUTRITION - FOCUSED PHYSICAL EXAM:  Working remotely.  Diet Order:   Diet Order            Diet NPO time specified  Diet effective now              EDUCATION NEEDS:   No education needs have been identified at this time  Skin:  Skin Assessment: Reviewed RN Assessment  Last BM:  PTA  Height:   Ht Readings from Last 1 Encounters:  04/22/19 6\' 2"  (1.88 m)    Weight:   Wt Readings from Last 1 Encounters:  04/22/19 119.3 kg    Ideal Body  Weight:  86.3 kg  BMI:  Body mass index is 33.77 kg/m.  Estimated Nutritional Needs:   Kcal:  9449-6759  Protein:  160-170g  Fluid:  1.6L/day   Clayton Bibles, MS, RD, LDN Inpatient Clinical Dietitian Pager: [phone] After Hours Pager: [phone]

## 2019-04-22 NOTE — Progress Notes (Signed)
1 unopened bottle of propofol (DIPRIVAN)  wasted on floor. Witnessed by Barnet Glasgow, RN and Cari Caraway, RN

## 2019-04-22 NOTE — H&P (Signed)
NAME:  Perry Li, MRN:  [id number], DOB:  November 21, 1985, LOS: 0 ADMISSION DATE:  04/22/2019, CONSULTATION DATE: 04/22/2019 [facility]FERRING [facility], CHIEF COMPLAINT: Acute respiratory failure  Brief History   33 year old male with a history of diabetes, anxiety, and high cholesterol who presented after being found after a suspected drug overdose, received Narcan, and then had an aspiration event leading to acute hypoxia.  Intubated for hypoxic respiratory failure and airway protection.  Admitting to ICU, [facility].    History of present illness   History was taken from chart review.  Patient was found to be blue and not breathing for 2 to 5 minutes before [employer] fire arrived.  He was given 4 mg of Narcan, and afterward patient vomited and became agitated.  He then became hypoxic on transfer and was placed on a nonrebreather.  He was then intubated in the ED.  Reportedly patient snorts trazodone, alcohol level was positive here.  Pulmonary critical care was asked to admit patient to [facility].  Patient was given Unasyn for concern for aspiration pneumonia in the ED.  At the bedside patient is sedated with propofol, appears uncomfortable on the vent.  Though he is hemodynamically stable except for tachycardia with heart rate in the 130s.    Past Medical History   Past Medical History:  Diagnosis Date  . Anxiety   . Chest pain   . High cholesterol   . Insomnia   . SOB (shortness of breath)      Significant Hospital Events   Intubated 12/20.  Consults:  Critical care  Procedures:  Intubation 12/20.  Significant Diagnostic Tests:  Chest x-ray showing bilateral infiltrates.  12/20.  Micro Data:  Blood cultures x2, 12/20. Covid PCR negative Flu a and B negative Trach aspirate 12/20.  Antimicrobials:  Unasyn, started 12/20  Interim history/subjective:    Objective   Blood pressure (!) 137/99, pulse (!) 139, temperature 99.7 F (37.6 C), temperature source  Oral, resp. rate (!) 29, height '6\' 2"'  (1.88 m), weight 118 kg, SpO2 92 %.    Vent Mode: PRVC FiO2 (%):  [100 %] 100 % Set Rate:  [16 bmp] 16 bmp Vt Set:  [620 mL] 620 mL PEEP:  [5 cmH20] 5 cmH20 Plateau Pressure:  [20 cmH20] 20 cmH20   Intake/Output Summary (Last 24 hours) at 04/22/2019 0403 Last data filed at 04/22/2019 7829 Gross per 24 hour  Intake 1130.67 ml  Output --  Net 1130.67 ml   Filed Weights   04/22/19 0205  Weight: 118 kg    Examination: General: Sedated with propofol, positive gag positive cough.   HENT: Bilateral dilated pupils, reactive ET tube in place Lungs: Bilateral rhonchi.  Appears air hungry on the vent Cardiovascular: Tachycardic, no murmurs, 2+ pulses Abdomen: Soft nondistended, nontender Extremities: No edema bilaterally Neuro: No focal deficits, bilateral dilated pupils, reactive to light.  Not following commands GU: No abnormalities  Resolved Hospital Problem list     Assessment & Plan:  33 year old male with a history of diabetes, hyperlipidemia, anxiety who presented with a drug overdose, respiratory failure.  Intubated for acute hypoxic respiratory failure secondary to aspiration pneumonia.  #Acute hypoxic and hypercarbic respiratory failure #Aspiration pneumonia Coming down on vent settings.  Would like for patient to be on 6 cc/kg ideal body weight tidal volumes, but currently is at about 7 cc/kg.  Increase PEEP to 10 from 5, and was able to come down on FiO2. -Continue ventilatory support, aim for 6  cc/kg ideal body weight per ARDS protocol -Continue Unasyn every 6. -Trach aspirate cultures and blood cultures. -Repeat venous blood gas this morning and adjust vent accordingly. -Decreased I-time and patient appeared more comfortable -Sedation with fentanyl and propofol drip.  For RASS of -3 to -4.  With hopes to lighten up a RASS to -1-0 if respiratory failure improves.   #Drug overdose: Benzo or trazodone overdose possible UDS is  pending. -Propofol for sedation for now -No signs of serotonin syndrome so okay to start fentanyl drip.  Will check CK -We will get EKG  #Diabetes: History of DKA, not on insulin at home.  Currently not in DKA -Sliding scale insulin every 4 for now     Best practice:  Diet: N.p.o. Pain/Anxiety/Delirium protocol (if indicated): Yes VAP protocol (if indicated): Yes DVT prophylaxis: Lovenox GI prophylaxis: Pepcid Glucose control: SSI Mobility: Hold off on PT for now Code Status: Full Family Communication: Did not talk to family today Disposition: ICU, [facility]  Labs   CBC: Recent Labs  Lab 04/16/19 1013 04/22/19 0229  WBC 7.8 10.2  NEUTROABS  --  7.5  HGB 15.1 16.6  HCT 45.5 52.7*  MCV 92.0 96.7  PLT 255.0 027    Basic Metabolic Panel: Recent Labs  Lab 04/16/19 1013 04/22/19 0229  NA 141 138  K 4.3 5.7*  CL 106 102  CO2 25 24  GLUCOSE 160* 221*  BUN 17 15  CREATININE 1.01 1.19  CALCIUM 9.3 8.0*   GFR: Estimated Creatinine Clearance: 122.8 mL/min (by C-G formula based on SCr of 1.19 mg/dL). Recent Labs  Lab 04/16/19 1013 04/22/19 0229  WBC 7.8 10.2    Liver Function Tests: Recent Labs  Lab 04/16/19 1013 04/22/19 0229  AST 32 59*  ALT 81* 102*  ALKPHOS 62 61  BILITOT 0.4 0.5  PROT 7.1 7.5  ALBUMIN 4.7 4.3   No results for input(s): LIPASE, AMYLASE in the last 168 hours. No results for input(s): AMMONIA in the last 168 hours.  ABG    Component Value Date/Time   PHART 7.152 (LL) 04/22/2019 0159   PCO2ART 62.0 (H) 04/22/2019 0159   PO2ART 96.3 04/22/2019 0159   HCO3 20.6 04/22/2019 0159   TCO2 12 (L) 05/11/2018 1545   ACIDBASEDEF 9.3 (H) 04/22/2019 0159   O2SAT 94.4 04/22/2019 0159     Coagulation Profile: No results for input(s): INR, PROTIME in the last 168 hours.  Cardiac Enzymes: No results for input(s): CKTOTAL, CKMB, CKMBINDEX, TROPONINI in the last 168 hours.  HbA1C: Hgb A1c MFr Bld  Date/Time Value Ref Range Status    04/16/2019 10:13 AM 5.8 4.6 - 6.5 % Final    Comment:    Glycemic Control Guidelines for People with Diabetes:Non Diabetic:  <6%Goal of Therapy: <7%Additional Action Suggested:  >8%   05/12/2018 05:12 AM 12.1 (H) 4.8 - 5.6 % Final    Comment:    (NOTE) Pre diabetes:          5.7%-6.4% Diabetes:              >6.4% Glycemic control for   <7.0% adults with diabetes     CBG: No results for input(s): GLUCAP in the last 168 hours.  Review of Systems:   Unable to obtain review of systems due to intubated status  Past Medical History  He,  has a past medical history of Anxiety, Chest pain, High cholesterol, Insomnia, and SOB (shortness of breath).   Surgical History    Past Surgical  History:  Procedure Laterality Date  . NO PAST SURGERIES       Social History   reports that he has never smoked. He has never used smokeless tobacco. He reports current alcohol use. He reports that he does not use drugs.   Family History   His family history includes Diabetes in his maternal grandmother; Healthy in his mother; Hypertension in his father.   Allergies No Known Allergies   Home Medications  Prior to Admission medications   Medication Sig Start Date End Date Taking? Authorizing Provider  blood glucose meter kit and supplies KIT Dispense based on patient and insurance preference. Use up to four times daily as directed. (FOR ICD-9 250.00, 250.01). 05/14/18   Kayleen Memos, DO  blood glucose meter kit and supplies Dispense based on patient and insurance preference. Use up to twice daily as directed. (FOR ICD-10 E10.9, E11.9). 05/11/18   Copland, Gay Filler, MD  clonazePAM (KLONOPIN) 1 MG tablet TAKE 1 TABLET(1 MG) BY MOUTH Three times DAILY. Patient taking differently: Take 1 mg by mouth 3 (three) times daily.  04/16/19   Copland, Gay Filler, MD  metFORMIN (GLUCOPHAGE XR) 500 MG 24 hr tablet Take 1 tablet (500 mg total) by mouth daily with breakfast. 04/16/19   Copland, Gay Filler, MD   traZODone (DESYREL) 150 MG tablet TAKE 1 TABLET BY MOUTH AT BEDTIME Patient not taking: Reported on 04/22/2019 10/13/18   Copland, Gay Filler, MD  traZODone (DESYREL) 50 MG tablet Take 2 tablets (100 mg total) by mouth at bedtime as needed for sleep. 10/12/18   Copland, Gay Filler, MD     Critical care time: 104

## 2019-04-23 ENCOUNTER — Inpatient Hospital Stay (HOSPITAL_COMMUNITY): Payer: BC Managed Care – PPO

## 2019-04-23 DIAGNOSIS — J690 Pneumonitis due to inhalation of food and vomit: Secondary | ICD-10-CM

## 2019-04-23 DIAGNOSIS — J9602 Acute respiratory failure with hypercapnia: Secondary | ICD-10-CM

## 2019-04-23 DIAGNOSIS — N179 Acute kidney failure, unspecified: Secondary | ICD-10-CM

## 2019-04-23 DIAGNOSIS — E1129 Type 2 diabetes mellitus with other diabetic kidney complication: Secondary | ICD-10-CM

## 2019-04-23 DIAGNOSIS — R809 Proteinuria, unspecified: Secondary | ICD-10-CM

## 2019-04-23 DIAGNOSIS — Z9911 Dependence on respirator [ventilator] status: Secondary | ICD-10-CM

## 2019-04-23 DIAGNOSIS — T50901A Poisoning by unspecified drugs, medicaments and biological substances, accidental (unintentional), initial encounter: Secondary | ICD-10-CM

## 2019-04-23 DIAGNOSIS — R7401 Elevation of levels of liver transaminase levels: Secondary | ICD-10-CM

## 2019-04-23 DIAGNOSIS — J80 Acute respiratory distress syndrome: Secondary | ICD-10-CM

## 2019-04-23 LAB — RAPID URINE DRUG SCREEN, HOSP PERFORMED
Amphetamines: NOT DETECTED
Barbiturates: NOT DETECTED
Benzodiazepines: POSITIVE — AB
Cocaine: POSITIVE — AB
Opiates: NOT DETECTED
Tetrahydrocannabinol: NOT DETECTED

## 2019-04-23 LAB — GLUCOSE, CAPILLARY
Glucose-Capillary: 140 mg/dL — ABNORMAL HIGH (ref 70–99)
Glucose-Capillary: 145 mg/dL — ABNORMAL HIGH (ref 70–99)
Glucose-Capillary: 153 mg/dL — ABNORMAL HIGH (ref 70–99)
Glucose-Capillary: 159 mg/dL — ABNORMAL HIGH (ref 70–99)
Glucose-Capillary: 161 mg/dL — ABNORMAL HIGH (ref 70–99)
Glucose-Capillary: 172 mg/dL — ABNORMAL HIGH (ref 70–99)

## 2019-04-23 LAB — COMPREHENSIVE METABOLIC PANEL
ALT: 68 U/L — ABNORMAL HIGH (ref 0–44)
AST: 71 U/L — ABNORMAL HIGH (ref 15–41)
Albumin: 3.5 g/dL (ref 3.5–5.0)
Alkaline Phosphatase: 54 U/L (ref 38–126)
Anion gap: 13 (ref 5–15)
BUN: 30 mg/dL — ABNORMAL HIGH (ref 6–20)
CO2: 21 mmol/L — ABNORMAL LOW (ref 22–32)
Calcium: 7.9 mg/dL — ABNORMAL LOW (ref 8.9–10.3)
Chloride: 104 mmol/L (ref 98–111)
Creatinine, Ser: 2.51 mg/dL — ABNORMAL HIGH (ref 0.61–1.24)
GFR calc Af Amer: 38 mL/min — ABNORMAL LOW (ref >60)
GFR calc non Af Amer: 33 mL/min — ABNORMAL LOW (ref >60)
Glucose, Bld: 164 mg/dL — ABNORMAL HIGH (ref 70–99)
Potassium: 4.5 mmol/L (ref 3.5–5.1)
Sodium: 138 mmol/L (ref 135–145)
Total Bilirubin: 0.7 mg/dL (ref 0.3–1.2)
Total Protein: 6.7 g/dL (ref 6.5–8.1)

## 2019-04-23 LAB — BLOOD GAS, ARTERIAL
Acid-base deficit: 2.5 mmol/L — ABNORMAL HIGH (ref 0.0–2.0)
Bicarbonate: 24.6 mmol/L (ref 20.0–28.0)
FIO2: 40
O2 Saturation: 99.1 %
PEEP: 5 cmH2O
Patient temperature: 98.6
RATE: 30 resp/min
pCO2 arterial: 53.7 mmHg — ABNORMAL HIGH (ref 32.0–48.0)
pH, Arterial: 7.283 — ABNORMAL LOW (ref 7.350–7.450)
pO2, Arterial: 146 mmHg — ABNORMAL HIGH (ref 83.0–108.0)

## 2019-04-23 LAB — CBC
HCT: 48 % (ref 39.0–52.0)
Hemoglobin: 15 g/dL (ref 13.0–17.0)
MCH: 30.5 pg (ref 26.0–34.0)
MCHC: 31.3 g/dL (ref 30.0–36.0)
MCV: 97.8 fL (ref 80.0–100.0)
Platelets: 223 10*3/uL (ref 150–400)
RBC: 4.91 MIL/uL (ref 4.22–5.81)
RDW: 12 % (ref 11.5–15.5)
WBC: 17 10*3/uL — ABNORMAL HIGH (ref 4.0–10.5)
nRBC: 0 % (ref 0.0–0.2)

## 2019-04-23 LAB — PHOSPHORUS: Phosphorus: 3.9 mg/dL (ref 2.5–4.6)

## 2019-04-23 LAB — MAGNESIUM: Magnesium: 2.1 mg/dL (ref 1.7–2.4)

## 2019-04-23 MED ORDER — DEXMEDETOMIDINE HCL IN NACL 400 MCG/100ML IV SOLN
0.4000 ug/kg/h | INTRAVENOUS | Status: DC
  Administered 2019-04-23 (×2): 1.2 ug/kg/h via INTRAVENOUS
  Administered 2019-04-24: 1.1 ug/kg/h via INTRAVENOUS
  Administered 2019-04-24: 1.2 ug/kg/h via INTRAVENOUS
  Filled 2019-04-23 (×6): qty 100

## 2019-04-23 MED ORDER — MIDAZOLAM HCL 2 MG/2ML IJ SOLN
2.0000 mg | INTRAMUSCULAR | Status: DC | PRN
  Filled 2019-04-23: qty 2

## 2019-04-23 MED ORDER — LABETALOL HCL 5 MG/ML IV SOLN
10.0000 mg | INTRAVENOUS | Status: DC | PRN
  Administered 2019-04-23 – 2019-04-25 (×2): 10 mg via INTRAVENOUS
  Filled 2019-04-23 (×3): qty 4

## 2019-04-23 MED ORDER — MIDAZOLAM HCL 2 MG/2ML IJ SOLN
INTRAMUSCULAR | Status: AC
  Administered 2019-04-23: 2 mg
  Filled 2019-04-23: qty 2

## 2019-04-23 MED ORDER — MIDAZOLAM HCL 2 MG/2ML IJ SOLN
4.0000 mg | INTRAMUSCULAR | Status: DC | PRN
  Administered 2019-04-24 (×3): 4 mg via INTRAVENOUS
  Filled 2019-04-23 (×3): qty 4

## 2019-04-23 MED ORDER — INSULIN DETEMIR 100 UNIT/ML ~~LOC~~ SOLN
5.0000 [IU] | Freq: Every day | SUBCUTANEOUS | Status: DC
  Administered 2019-04-23 – 2019-04-28 (×6): 5 [IU] via SUBCUTANEOUS
  Filled 2019-04-23 (×7): qty 0.05

## 2019-04-23 NOTE — Progress Notes (Signed)
[facility] Progress Note Patient Name: Perry Li DOB: May 16, 1985 MRN: [id number]   Date of Service  04/23/2019  HPI/Events of Note  Delirium/Severe Agitation - Self extubation event today. Patient is kicking the bed. Request for 4 point restraints.  eICU Interventions  Will order: 1. Increase ceiling on Precedex IV infusion to 1.7 mcg/kg/hour. 2. Titrate Fentanyl IV infusion as ordered.  3. Versed 4 mg IV Q 1 hour PRN severe agitation.      Intervention Category Major Interventions: Delirium, psychosis, severe agitation - evaluation and management  Lysle Dingwall 04/23/2019, 8:37 PM

## 2019-04-23 NOTE — Progress Notes (Addendum)
NAME:  Perry Li, MRN:  [id number], DOB:  [birth year], LOS: 1 ADMISSION DATE:  04/22/2019, CONSULTATION DATE: 04/22/2019 REFERRING MD: [facility], CHIEF COMPLAINT: Acute respiratory failure  Brief History   33 year old male with a history of diabetes, anxiety, and high cholesterol who presented after being found after a suspected drug overdose, received Narcan, and then had an aspiration event leading to acute hypoxia.  Intubated for hypoxic respiratory failure and airway protection.  Admitting to ICU, [facility].    Past Medical History   Past Medical History:  Diagnosis Date  . Anxiety   . Chest pain   . High cholesterol   . Insomnia   . SOB (shortness of breath)      Significant Hospital Events   Intubated 12/20: placed  In prone position  12/21: back in supine position. sats maintaining changing sedation goals to light, serum creatinine doubled Consults:  Critical care  Procedures:  Intubation 12/20  Significant Diagnostic Tests:  Chest x-ray 12/20-Central bilateral infiltrates  Micro Data:  12/20 blood cultures x2 >> Covid PCR negative Flu a and B negative Trach aspirate 12/20>>   Antimicrobials:  Unasyn, 12/20>>  Interim history/subjective:  Still sedated   Objective   Blood pressure (Abnormal) 123/58, pulse (Abnormal) 101, temperature 99 F (37.2 C), temperature source Rectal, resp. rate (Abnormal) 30, height 6\' 2"  (1.88 m), weight 116.8 kg, SpO2 96 %.    Vent Mode: PRVC FiO2 (%):  [40 %-60 %] 40 % Set Rate:  [30 bmp] 30 bmp Vt Set:  [500 mL] 500 mL PEEP:  [5 cmH20-10 cmH20] 5 cmH20 Plateau Pressure:  [15 cmH20-23 cmH20] 15 cmH20   Intake/Output Summary (Last 24 hours) at 04/23/2019 1342 Last data filed at 04/23/2019 0800 Gross per 24 hour  Intake 1931.16 ml  Output 1845 ml  Net 86.16 ml   Filed Weights   04/22/19 0205 04/22/19 0500 04/23/19 0356  Weight: 118 kg 119.3 kg 116.8 kg    Examination: General this is a 33 year old male  patient currently sedated on full ventilatory support HEENT normocephalic atraumatic no jugular venous distention appreciated, orally intubated Pulmonary: No wheezing equal chest rise bilaterally decreased bases.  Some scattered rhonchi.  P plat currently 24, FiO2 is 40% and PEEP at 5 saturations 99 to 100% Cardiac regular rate and rhythm without appreciable murmur rub or gallop Abdomen soft nontender no organomegaly tolerating tube feeds Extremities warm and dry brisk capillary refill pulses are strong Neuro heavily sedated  Resolved Hospital Problem list     Assessment & Plan:  33 year old male with a history of diabetes, hyperlipidemia, anxiety who presented with a acute encephalopathy due to a drug overdose which responded to Narcan.  He subsequently had emesis with aspiration causing respiratory failure.  Intubated for acute hypoxic respiratory failure secondary to aspiration pneumonia.  Acute hypoxic and hypercarbic respiratory failure due to drug overdose and aspiration pneumonia. ARDS (P:F 116)  -He is now back in the supine position following 16 hours of prone ventilation, currently ventilator mechanics look much improved Plan Repeating arterial blood gas Continue ARDS protocol with low tidal volume ventilation Goal plateau pressure less than 30, currently 22 Goal driving pressure of less than 15 PAD protocol, changing RASS goal to -1; will use Precedex with as needed fentanyl Hold off on diuresis given AKI Day #2 Unasyn A.m. chest x-ray VAP bundle  Medicated related hypotension -Blood pressure dropping with propofol Plan Keep euvolemic Transitioning to Precedex Phenylephrine for mean arterial pressure greater than 65  AKI Suspect hemodynamically mediated.  Serum creatinine worse today when comparing to day prior Plan Avoid hypotension Renal dose medications Strict intake output A.m. chemistry  Drug overdose: Benzo or trazodone overdose, ethanol -Likely this was an  accidental overdose, but will determine this once he is able to be extubated Plan Will require psychiatry evaluation following extubation  Diabetes: History of DKA, not on insulin at home.  On PTA Metformin Hemoglobin A1c 6.1 Plan Sliding scale insulin Starting levemir   Elevated transaminase levels-chronic Plan Consider outpatient evaluation  Best practice:  Diet: TF Pain/Anxiety/Delirium protocol (if indicated): Yes VAP protocol (if indicated): Yes DVT prophylaxis: Lovenox GI prophylaxis: Pepcid Glucose control: SSI Mobility: Bedrest Code Status: Full Family Communication: mother updated at bedside Disposition: ICU   My critical care time 56 minutes Simonne Martinet ACNP-BC [facility] Pulmonary/Critical Care Pager # [phone] OR # [phone] if no answer  My cct 34 minutes

## 2019-04-23 NOTE — Progress Notes (Signed)
Pt became increasingly agitated and required increased sedation of fentanyl and precedex as well as 4 point restraints. Agricultural consultant, multiple RN's, RT, nurse tech, and security for a total of 9 people were in the room to hold the patient down to prevent extubation. Pt was reassured multiple times and asked to calm down with no success. After fentanyl medication bolus also proved unsuccessful MD ordered 4mg  versed and this medication worked. RT at head of bed and assessed ET tube. No change in markings, 25 @ the lip. Breath sounds auscultated bilaterally, equal rise and fall. Pt oxygen sating well. Will continue to closely monitor.

## 2019-04-23 NOTE — Progress Notes (Signed)
RN called RT - ETT out to 18cm (PT pulled). RT advanced ETT back to 26 cm lip, end tidal positive, Sp02 94%, receiving full tidal volume. RN is calling for chest xray for placement.

## 2019-04-23 NOTE — Progress Notes (Signed)
ABG drawn and sent to lab. Lab notified @1420 .

## 2019-04-23 NOTE — Progress Notes (Signed)
Pts. head repositioned to facing R side, ET remains @ 26 cm, pt. remains proned, RT to monitor.

## 2019-04-23 NOTE — Progress Notes (Signed)
[facility] Progress Note Patient Name: Perry Li DOB: [birth year] MRN: [id number]   Date of Service  04/23/2019  HPI/Events of Note  Notified of HTN. BP 172/61. Patient seen sedated and proned.  eICU Interventions  Ordered prn labetalol     Intervention Category Major Interventions: Hypertension - evaluation and management  [name] 04/23/2019, 3:55 AM

## 2019-04-24 ENCOUNTER — Inpatient Hospital Stay (HOSPITAL_COMMUNITY): Payer: BC Managed Care – PPO

## 2019-04-24 DIAGNOSIS — T50901A Poisoning by unspecified drugs, medicaments and biological substances, accidental (unintentional), initial encounter: Secondary | ICD-10-CM

## 2019-04-24 LAB — COMPREHENSIVE METABOLIC PANEL
ALT: 62 U/L — ABNORMAL HIGH (ref 0–44)
AST: 108 U/L — ABNORMAL HIGH (ref 15–41)
Albumin: 3.1 g/dL — ABNORMAL LOW (ref 3.5–5.0)
Alkaline Phosphatase: 55 U/L (ref 38–126)
Anion gap: 8 (ref 5–15)
BUN: 25 mg/dL — ABNORMAL HIGH (ref 6–20)
CO2: 26 mmol/L (ref 22–32)
Calcium: 8.1 mg/dL — ABNORMAL LOW (ref 8.9–10.3)
Chloride: 106 mmol/L (ref 98–111)
Creatinine, Ser: 1.39 mg/dL — ABNORMAL HIGH (ref 0.61–1.24)
GFR calc Af Amer: >60 mL/min (ref >60)
GFR calc non Af Amer: >60 mL/min (ref >60)
Glucose, Bld: 143 mg/dL — ABNORMAL HIGH (ref 70–99)
Potassium: 4.6 mmol/L (ref 3.5–5.1)
Sodium: 140 mmol/L (ref 135–145)
Total Bilirubin: 0.7 mg/dL (ref 0.3–1.2)
Total Protein: 6 g/dL — ABNORMAL LOW (ref 6.5–8.1)

## 2019-04-24 LAB — PHOSPHORUS: Phosphorus: 2 mg/dL — ABNORMAL LOW (ref 2.5–4.6)

## 2019-04-24 LAB — CBC
HCT: 38.1 % — ABNORMAL LOW (ref 39.0–52.0)
Hemoglobin: 12.1 g/dL — ABNORMAL LOW (ref 13.0–17.0)
MCH: 30.6 pg (ref 26.0–34.0)
MCHC: 31.8 g/dL (ref 30.0–36.0)
MCV: 96.5 fL (ref 80.0–100.0)
Platelets: 194 10*3/uL (ref 150–400)
RBC: 3.95 MIL/uL — ABNORMAL LOW (ref 4.22–5.81)
RDW: 12 % (ref 11.5–15.5)
WBC: 13.3 10*3/uL — ABNORMAL HIGH (ref 4.0–10.5)
nRBC: 0 % (ref 0.0–0.2)

## 2019-04-24 LAB — CULTURE, RESPIRATORY W GRAM STAIN: Culture: NORMAL

## 2019-04-24 LAB — GLUCOSE, CAPILLARY
Glucose-Capillary: 115 mg/dL — ABNORMAL HIGH (ref 70–99)
Glucose-Capillary: 126 mg/dL — ABNORMAL HIGH (ref 70–99)
Glucose-Capillary: 139 mg/dL — ABNORMAL HIGH (ref 70–99)
Glucose-Capillary: 144 mg/dL — ABNORMAL HIGH (ref 70–99)
Glucose-Capillary: 147 mg/dL — ABNORMAL HIGH (ref 70–99)
Glucose-Capillary: 169 mg/dL — ABNORMAL HIGH (ref 70–99)

## 2019-04-24 LAB — MAGNESIUM: Magnesium: 2.4 mg/dL (ref 1.7–2.4)

## 2019-04-24 MED ORDER — DEXMEDETOMIDINE HCL IN NACL 200 MCG/50ML IV SOLN
0.4000 ug/kg/h | INTRAVENOUS | Status: DC
  Administered 2019-04-24: 1.2 ug/kg/h via INTRAVENOUS
  Administered 2019-04-24 (×2): 0.6 ug/kg/h via INTRAVENOUS
  Administered 2019-04-24: 1.2 ug/kg/h via INTRAVENOUS
  Administered 2019-04-24: 0.8 ug/kg/h via INTRAVENOUS
  Administered 2019-04-24: 0.6 ug/kg/h via INTRAVENOUS
  Administered 2019-04-25: 0.8 ug/kg/h via INTRAVENOUS
  Administered 2019-04-25: 0.6 ug/kg/h via INTRAVENOUS
  Administered 2019-04-25: 0.8 ug/kg/h via INTRAVENOUS
  Filled 2019-04-24 (×10): qty 50

## 2019-04-24 MED ORDER — TAMSULOSIN HCL 0.4 MG PO CAPS
0.4000 mg | ORAL_CAPSULE | Freq: Every day | ORAL | Status: DC
  Administered 2019-04-24 – 2019-04-29 (×6): 0.4 mg via ORAL
  Filled 2019-04-24 (×6): qty 1

## 2019-04-24 MED ORDER — ACETAMINOPHEN 160 MG/5ML PO SOLN
650.0000 mg | Freq: Four times a day (QID) | ORAL | Status: DC | PRN
  Administered 2019-04-24 – 2019-04-29 (×7): 650 mg via ORAL
  Filled 2019-04-24 (×7): qty 20.3

## 2019-04-24 MED ORDER — ALBUTEROL SULFATE (2.5 MG/3ML) 0.083% IN NEBU
2.5000 mg | INHALATION_SOLUTION | RESPIRATORY_TRACT | Status: DC | PRN
  Administered 2019-04-24 – 2019-04-26 (×2): 2.5 mg via RESPIRATORY_TRACT
  Filled 2019-04-24 (×2): qty 3

## 2019-04-24 MED ORDER — SENNOSIDES 8.8 MG/5ML PO SYRP
5.0000 mL | ORAL_SOLUTION | Freq: Two times a day (BID) | ORAL | Status: DC | PRN
  Filled 2019-04-24: qty 5

## 2019-04-24 MED ORDER — GUAIFENESIN ER 600 MG PO TB12
600.0000 mg | ORAL_TABLET | Freq: Two times a day (BID) | ORAL | Status: DC
  Administered 2019-04-24 – 2019-04-29 (×10): 600 mg via ORAL
  Filled 2019-04-24 (×10): qty 1

## 2019-04-24 MED ORDER — POTASSIUM & SODIUM PHOSPHATES 280-160-250 MG PO PACK
1.0000 | PACK | ORAL | Status: AC
  Administered 2019-04-24 (×2): 1 via ORAL
  Filled 2019-04-24 (×2): qty 1

## 2019-04-24 MED ORDER — CLONAZEPAM 0.5 MG PO TABS
0.5000 mg | ORAL_TABLET | Freq: Three times a day (TID) | ORAL | Status: DC
  Administered 2019-04-24 (×2): 0.5 mg via ORAL
  Filled 2019-04-24 (×2): qty 1

## 2019-04-24 NOTE — Procedures (Signed)
Extubation Procedure Note  Patient Details:   Name: Perry Li DOB: [birth year] MRN: [id number]   Airway Documentation:    Vent end date: 04/24/19 Vent end time: 0910   Evaluation  O2 sats: stable throughout Complications: No apparent complications Patient did tolerate procedure well. Bilateral Breath Sounds: Clear, Diminished   Yes  Baldwin Jamaica Nannette 04/24/2019, 9:19 AM   Positive cuff leak pre extubation. Place on 13 LPM [place]- RN aware and will titrate.

## 2019-04-24 NOTE — Progress Notes (Signed)
[facility] Progress Note Patient Name: Perry Li DOB: November 24, 1985 MRN: [id number]   Date of Service  04/24/2019  HPI/Events of Note  Oliguria - Bladder scan with 460 mL residual.   eICU Interventions  Will order: 1. I/O cath PRN     Intervention Category Intermediate Interventions: Oliguria - evaluation and management  Vivan Agostino Eugene 04/24/2019, 5:10 AM

## 2019-04-24 NOTE — Progress Notes (Signed)
NAME:  Perry Li, MRN:  [id number], DOB:  [birth year], LOS: 2 ADMISSION DATE:  04/22/2019, CONSULTATION DATE: 04/22/2019 REFERRING MD: [facility], CHIEF COMPLAINT: Acute respiratory failure  Brief History   33 year old male with a history of diabetes, anxiety, and high cholesterol who presented after being found after a suspected drug overdose, received Narcan, and then had an aspiration event leading to acute hypoxia.  Intubated for hypoxic respiratory failure and airway protection.  Admitting to [facility], [facility].    Past Medical History   Past Medical History:  Diagnosis Date  . Anxiety   . Chest pain   . High cholesterol   . Insomnia   . SOB (shortness of breath)      Significant Hospital Events   Intubated 12/20: placed  In prone position  12/21: back in supine position. sats maintaining changing sedation goals to light, serum creatinine doubled 12/22 extubated  Consults:  Critical care  Procedures:  Intubation 12/20  Significant Diagnostic Tests:  Chest x-ray 12/20-Central bilateral infiltrates  Micro Data:  12/20 blood cultures x2 >> Covid PCR negative Flu a and B negative Trach aspirate 12/20>>   Antimicrobials:  Unasyn, 12/20>>  Interim history/subjective:  Awake now s/p extubation   Objective   Blood pressure 126/73, pulse 87, temperature 99 F (37.2 C), temperature source Oral, resp. rate (Abnormal) 21, height 6\' 2"  (1.88 m), weight 119.2 kg, SpO2 98 %.    Vent Mode: CPAP;PSV FiO2 (%):  [30 %-40 %] 30 % Set Rate:  [30 bmp-32 bmp] 30 bmp Vt Set:  [500 mL] 500 mL PEEP:  [5 cmH20] 5 cmH20 Pressure Support:  [5 cmH20] 5 cmH20 Plateau Pressure:  [17 cmH20-19 cmH20] 19 cmH20   Intake/Output Summary (Last 24 hours) at 04/24/2019 1336 Last data filed at 04/24/2019 1300 Gross per 24 hour  Intake 2554.79 ml  Output 1900 ml  Net 654.79 ml   Filed Weights   04/22/19 0500 04/23/19 0356 04/24/19 0446  Weight: 119.3 kg 116.8 kg 119.2 kg     Examination: General this is a 33 year old male, resting in bed HENT NCAT no JVD pulm crackles bases. No accessory use  Card RRR  abd soft not tender + bowel sounds Ext brisk CR warm no edema  Neuro intact  Psych affect flat    Resolved Hospital Problem list   Medication related hypotension (resolved)   Assessment & Plan:  33 year old male with a history of diabetes, hyperlipidemia, anxiety who presented with a acute encephalopathy due to a drug overdose which responded to Narcan.  He subsequently had emesis with aspiration causing respiratory failure.  Intubated for acute hypoxic respiratory failure secondary to aspiration pneumonia.  Acute hypoxic and hypercarbic respiratory failure due to drug overdose and aspiration pneumonia. ARDS (P:F 116)  Now extubated but still requiring high FIO2 PCXR slight improved aeration bilaterally c/w yesterday  Plan Wean oxygen IS Pulse ox  NPO except sips unasyn day 3/5 PCXR AM   AKI Suspect hemodynamically mediated.  Serum creatinine improved Plan Cont gentle IVFs Renal dose meds Avoid hypotension Am chemistry   Drug overdose: Benzo or trazodone overdose, ethanol -Likely this was an accidental overdose, but he has had what appears to be freq refill requests for his clonazepam  Plan Cont precedex (plan to decrease to 1/2 current infusion rate) Add back clonazepam at 1/2 dosing   Diabetes: History of DKA, not on insulin at home.  On PTA Metformin Hemoglobin A1c 6.1 Plan Cont ssi and levemir  Elevated transaminase levels-chronic Plan Will need out-pt eval   Best practice:  Diet: TF; NPO  Pain/Anxiety/Delirium protocol (if indicated): Yes VAP protocol (if indicated): Yes DVT prophylaxis: Lovenox GI prophylaxis: Pepcid Glucose control: SSI Mobility: Bedrest Code Status: Full Family Communication: mother updated at bedside Disposition: ICU   My critical care time 68 minutes  Simonne Martinet ACNP-BC [facility]  Pulmonary/Critical Care Pager # [phone] OR # [phone] if no answer

## 2019-04-24 NOTE — Progress Notes (Addendum)
Pt did not urinate during shift, bladder scan showed 345 and 464 respectively. MD ordered PRN I/O cath and  pt put 800ccs out. Catheter occluded at the end by blood clots, MD notified. Urinalysis sent to lab. Foley catheter was D/c'd yesterday 12/21 due to catheter occlusion also. Day shift RN was unable to flush and after an I/O cath also got 800cc out. Will continue to monitor.

## 2019-04-24 NOTE — Progress Notes (Signed)
[facility] Progress Note Patient Name: Perry Li DOB: 09-03-1985 MRN: [id number]   Date of Service  04/24/2019  HPI/Events of Note  I/O cath for 700 mL of foul smelling urine with sediment.   eICU Interventions  Will order: 1. UA with reflex microscopic now. 2. Urine culture now.      Intervention Category Major Interventions: Infection - evaluation and management  Landers [name] 04/24/2019, 6:11 AM

## 2019-04-24 NOTE — Progress Notes (Signed)
PT demonstrated hands on understanding of Flutter device. PC at this time. 

## 2019-04-25 ENCOUNTER — Inpatient Hospital Stay (HOSPITAL_COMMUNITY): Payer: BC Managed Care – PPO

## 2019-04-25 DIAGNOSIS — F419 Anxiety disorder, unspecified: Secondary | ICD-10-CM

## 2019-04-25 LAB — CBC
HCT: 36.8 % — ABNORMAL LOW (ref 39.0–52.0)
Hemoglobin: 11.8 g/dL — ABNORMAL LOW (ref 13.0–17.0)
MCH: 30.5 pg (ref 26.0–34.0)
MCHC: 32.1 g/dL (ref 30.0–36.0)
MCV: 95.1 fL (ref 80.0–100.0)
Platelets: 194 10*3/uL (ref 150–400)
RBC: 3.87 MIL/uL — ABNORMAL LOW (ref 4.22–5.81)
RDW: 11.8 % (ref 11.5–15.5)
WBC: 12.3 10*3/uL — ABNORMAL HIGH (ref 4.0–10.5)
nRBC: 0 % (ref 0.0–0.2)

## 2019-04-25 LAB — GLUCOSE, CAPILLARY
Glucose-Capillary: 100 mg/dL — ABNORMAL HIGH (ref 70–99)
Glucose-Capillary: 107 mg/dL — ABNORMAL HIGH (ref 70–99)
Glucose-Capillary: 117 mg/dL — ABNORMAL HIGH (ref 70–99)
Glucose-Capillary: 126 mg/dL — ABNORMAL HIGH (ref 70–99)
Glucose-Capillary: 148 mg/dL — ABNORMAL HIGH (ref 70–99)
Glucose-Capillary: 170 mg/dL — ABNORMAL HIGH (ref 70–99)

## 2019-04-25 LAB — COMPREHENSIVE METABOLIC PANEL
ALT: 86 U/L — ABNORMAL HIGH (ref 0–44)
AST: 214 U/L — ABNORMAL HIGH (ref 15–41)
Albumin: 3.2 g/dL — ABNORMAL LOW (ref 3.5–5.0)
Alkaline Phosphatase: 72 U/L (ref 38–126)
Anion gap: 8 (ref 5–15)
BUN: 16 mg/dL (ref 6–20)
CO2: 26 mmol/L (ref 22–32)
Calcium: 8.7 mg/dL — ABNORMAL LOW (ref 8.9–10.3)
Chloride: 105 mmol/L (ref 98–111)
Creatinine, Ser: 0.84 mg/dL (ref 0.61–1.24)
GFR calc Af Amer: >60 mL/min (ref >60)
GFR calc non Af Amer: >60 mL/min (ref >60)
Glucose, Bld: 117 mg/dL — ABNORMAL HIGH (ref 70–99)
Potassium: 4.1 mmol/L (ref 3.5–5.1)
Sodium: 139 mmol/L (ref 135–145)
Total Bilirubin: 1 mg/dL (ref 0.3–1.2)
Total Protein: 6.8 g/dL (ref 6.5–8.1)

## 2019-04-25 LAB — MAGNESIUM: Magnesium: 2.1 mg/dL (ref 1.7–2.4)

## 2019-04-25 LAB — PHOSPHORUS: Phosphorus: 2 mg/dL — ABNORMAL LOW (ref 2.5–4.6)

## 2019-04-25 MED ORDER — SERTRALINE HCL 50 MG PO TABS
50.0000 mg | ORAL_TABLET | Freq: Every day | ORAL | Status: DC
  Administered 2019-04-25 – 2019-04-29 (×5): 50 mg via ORAL
  Filled 2019-04-25 (×5): qty 1

## 2019-04-25 MED ORDER — CLONAZEPAM 1 MG PO TABS
1.0000 mg | ORAL_TABLET | Freq: Three times a day (TID) | ORAL | Status: DC
  Administered 2019-04-25 – 2019-04-29 (×14): 1 mg via ORAL
  Filled 2019-04-25 (×14): qty 1

## 2019-04-25 MED ORDER — POTASSIUM & SODIUM PHOSPHATES 280-160-250 MG PO PACK
1.0000 | PACK | ORAL | Status: AC
  Administered 2019-04-25 (×2): 1 via ORAL
  Filled 2019-04-25 (×2): qty 1

## 2019-04-25 MED ORDER — IBUPROFEN 200 MG PO TABS
400.0000 mg | ORAL_TABLET | Freq: Once | ORAL | Status: AC
  Administered 2019-04-25: 400 mg via ORAL
  Filled 2019-04-25: qty 2

## 2019-04-25 MED ORDER — ADULT MULTIVITAMIN W/MINERALS CH
1.0000 | ORAL_TABLET | Freq: Every day | ORAL | Status: DC
  Administered 2019-04-25 – 2019-04-29 (×5): 1 via ORAL
  Filled 2019-04-25 (×5): qty 1

## 2019-04-25 MED ORDER — FUROSEMIDE 10 MG/ML IJ SOLN
40.0000 mg | Freq: Once | INTRAMUSCULAR | Status: AC
  Administered 2019-04-25: 40 mg via INTRAVENOUS
  Filled 2019-04-25: qty 4

## 2019-04-25 MED ORDER — PRO-STAT SUGAR FREE PO LIQD
30.0000 mL | Freq: Four times a day (QID) | ORAL | Status: DC
  Administered 2019-04-25 – 2019-04-29 (×12): 30 mL via ORAL
  Filled 2019-04-25 (×14): qty 30

## 2019-04-25 NOTE — Consult Note (Addendum)
[facility] Consult   Reason for Consult:  "anxiety, self medicating at home, had an OD leading to aspiration pneumonia" Referring Physician: Dr. Carlis Abbott Patient Identification: Perry Li MRN:  [id number] Principal Diagnosis: Acute respiratory failure ([place]) Diagnosis:  Principal Problem:   Acute respiratory failure ([place]) Active Problems:   Drug overdose   Total Time spent with patient: 45 minutes  Subjective:   Perry Li is a 33 y.o. male patient admitted with "anxiety and overdose leading to aspiration pneumonia."  Patient assessed by nurse practitioner.  Patient alert and oriented, answers appropriately.  Patient states "I tried a little bit of heroin but I am not a user like that, I think I did not have the tolerance that I needed."  Patient adamantly denies suicidal ideations.  Patient denies this was an intentional overdose.  Patient denies history of suicidal ideations and denies history of self-harm.  Patient denies homicidal ideations.  Patient denies access to weapons.  Patient denies hallucinations.  Patient reports history of substance use including heroin and oxycodone.  Patient reports "I have only used heroin a couple of times."  Patient states "I will never do that (heroin) again."  Patient reports interest in outpatient substance use treatment. Patient endorses intermittent alcohol use.  Patient educated regarding use of alcohol while taking benzodiazepine medication.  Patient verbalizes understanding. Patient reports he lives with mother and father and sisters.  Patient reports he feels safe at home.  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  Patient reports he is treated for anxiety by primary care, patient reports prescribed Klonopin 1 mg 3 times daily as needed.  Patient gives verbal consent to speak with his mother Perry Li phone number [phone]. Patient's mother Perry Li arrived to room: Patient's mother reports patient  has never attempted self-harm, never had homicidal ideations.  Patient's mother denies history of hallucinations.  Patient's mother reports that patient was diagnosed at age 82 with ADHD.  Patient's mother denies weapons in home.  Patient's mother denies concern for patient safety. Case discussed with Dr. Parke Poisson who recommends outpatient and substance use treatment follow-up.  HPI: Patient admitted after unintentional overdose, possibly heroin.  Patient diagnosed with aspiration pneumonia.   Past Psychiatric History: Anxiety  Risk to Self:  No Risk to Others:  No Prior Inpatient Therapy:  Denies Prior Outpatient Therapy:  Denies  Past Medical History:  Past Medical History:  Diagnosis Date  . Anxiety   . Chest pain   . High cholesterol   . Insomnia   . SOB (shortness of breath)     Past Surgical History:  Procedure Laterality Date  . NO PAST SURGERIES     Family History:  Family History  Problem Relation Age of Onset  . Healthy Mother   . Hypertension Father   . Diabetes Maternal Grandmother    Family Psychiatric  History: Denies Social History:  Social History   Substance and Sexual Activity  Alcohol Use Yes  . Alcohol/week: 0.0 standard drinks   Comment: occasional     Social History   Substance and Sexual Activity  Drug Use No    Social History   Socioeconomic History  . Marital status: Single    Spouse name: Not on file  . Number of children: Not on file  . Years of education: Not on file  . Highest education level: Not on file  Occupational History  . Not on file  Tobacco Use  . Smoking status: Never Smoker  .  Smokeless tobacco: Never Used  Substance and Sexual Activity  . Alcohol use: Yes    Alcohol/week: 0.0 standard drinks    Comment: occasional  . Drug use: No  . Sexual activity: Not on file  Other Topics Concern  . Not on file  Social History Narrative  . Not on file   Social Determinants of Health   Financial Resource Strain:   .  Difficulty of Paying Living Expenses: Not on file  Food Insecurity:   . Worried About Charity fundraiser in the Last Year: Not on file  . Ran Out of Food in the Last Year: Not on file  Transportation Needs:   . Lack of Transportation (Medical): Not on file  . Lack of Transportation (Non-Medical): Not on file  Physical Activity:   . Days of Exercise per Week: Not on file  . Minutes of Exercise per Session: Not on file  Stress:   . Feeling of Stress : Not on file  Social Connections:   . Frequency of Communication with Friends and Family: Not on file  . Frequency of Social Gatherings with Friends and Family: Not on file  . Attends Religious Services: Not on file  . Active Member of Clubs or Organizations: Not on file  . Attends Archivist Meetings: Not on file  . Marital Status: Not on file   Additional Social History:    Allergies:  No Known Allergies  Labs:  Results for orders placed or performed during the hospital encounter of 04/22/19 (from the past 48 hour(s))  Glucose, capillary     Status: Abnormal   Collection Time: 04/23/19  5:34 PM  Result Value Ref Range   Glucose-Capillary 172 (H) 70 - 99 mg/dL   Comment 1 Notify RN    Comment 2 Document in Chart   Glucose, capillary     Status: Abnormal   Collection Time: 04/23/19  7:51 PM  Result Value Ref Range   Glucose-Capillary 145 (H) 70 - 99 mg/dL   Comment 1 Notify RN    Comment 2 Document in Chart   Glucose, capillary     Status: Abnormal   Collection Time: 04/23/19 11:43 PM  Result Value Ref Range   Glucose-Capillary 140 (H) 70 - 99 mg/dL   Comment 1 Notify RN    Comment 2 Document in Chart   Magnesium     Status: None   Collection Time: 04/24/19  2:16 AM  Result Value Ref Range   Magnesium 2.4 1.7 - 2.4 mg/dL    Comment: Performed at [facility], [place] [address], [place], [place] [address]  Phosphorus     Status: Abnormal   Collection Time: 04/24/19  2:16 AM  Result Value Ref  Range   Phosphorus 2.0 (L) 2.5 - 4.6 mg/dL    Comment: Performed at [facility], [place] [address], [place], [place] [address]  CBC     Status: Abnormal   Collection Time: 04/24/19  2:16 AM  Result Value Ref Range   WBC 13.3 (H) 4.0 - 10.5 K/uL   RBC 3.95 (L) 4.22 - 5.81 MIL/uL   Hemoglobin 12.1 (L) 13.0 - 17.0 g/dL   HCT 38.1 (L) 39.0 - 52.0 %   MCV 96.5 80.0 - 100.0 fL   MCH 30.6 26.0 - 34.0 pg   MCHC 31.8 30.0 - 36.0 g/dL   RDW 12.0 11.5 - 15.5 %   Platelets 194 150 - 400 K/uL   nRBC 0.0 0.0 -  0.2 %    Comment: Performed at [facility], [place] [address] [place], [place] [address]  Comprehensive metabolic panel in am     Status: Abnormal   Collection Time: 04/24/19  2:16 AM  Result Value Ref Range   Sodium 140 135 - 145 mmol/L   Potassium 4.6 3.5 - 5.1 mmol/L   Chloride 106 98 - 111 mmol/L   CO2 26 22 - 32 mmol/L   Glucose, Bld 143 (H) 70 - 99 mg/dL   BUN 25 (H) 6 - 20 mg/dL   Creatinine, Ser 1.39 (H) 0.61 - 1.24 mg/dL    Comment: DELTA CHECK NOTED   Calcium 8.1 (L) 8.9 - 10.3 mg/dL   Total Protein 6.0 (L) 6.5 - 8.1 g/dL   Albumin 3.1 (L) 3.5 - 5.0 g/dL   AST 108 (H) 15 - 41 U/L   ALT 62 (H) 0 - 44 U/L   Alkaline Phosphatase 55 38 - 126 U/L   Total Bilirubin 0.7 0.3 - 1.2 mg/dL   GFR calc non Af Amer >60 >60 mL/min   GFR calc Af Amer >60 >60 mL/min   Anion gap 8 5 - 15    Comment: Performed at [facility], [place] [address], [place], [place] [address]  Glucose, capillary     Status: Abnormal   Collection Time: 04/24/19  3:45 AM  Result Value Ref Range   Glucose-Capillary 169 (H) 70 - 99 mg/dL   Comment 1 Notify RN    Comment 2 Document in Chart   Glucose, capillary     Status: Abnormal   Collection Time: 04/24/19  7:30 AM  Result Value Ref Range   Glucose-Capillary 144 (H) 70 - 99 mg/dL  Glucose, capillary     Status: Abnormal   Collection Time: 04/24/19 11:32 AM  Result Value Ref Range   Glucose-Capillary 147  (H) 70 - 99 mg/dL  Glucose, capillary     Status: Abnormal   Collection Time: 04/24/19  3:51 PM  Result Value Ref Range   Glucose-Capillary 139 (H) 70 - 99 mg/dL   Comment 1 Notify RN    Comment 2 Document in Chart   Glucose, capillary     Status: Abnormal   Collection Time: 04/24/19  7:59 PM  Result Value Ref Range   Glucose-Capillary 126 (H) 70 - 99 mg/dL   Comment 1 Notify RN    Comment 2 Document in Chart   Glucose, capillary     Status: Abnormal   Collection Time: 04/24/19 11:35 PM  Result Value Ref Range   Glucose-Capillary 115 (H) 70 - 99 mg/dL   Comment 1 Notify RN    Comment 2 Document in Chart   Magnesium     Status: None   Collection Time: 04/25/19  2:11 AM  Result Value Ref Range   Magnesium 2.1 1.7 - 2.4 mg/dL    Comment: Performed at [facility], [place] [address], [place], [place] [address]  Phosphorus     Status: Abnormal   Collection Time: 04/25/19  2:11 AM  Result Value Ref Range   Phosphorus 2.0 (L) 2.5 - 4.6 mg/dL    Comment: Performed at [facility], [place] [address], [place], [place] [address]  CBC     Status: Abnormal   Collection Time: 04/25/19  2:11 AM  Result Value Ref Range   WBC 12.3 (H) 4.0 - 10.5 K/uL   RBC 3.87 (L) 4.22 - 5.81 MIL/uL   Hemoglobin  11.8 (L) 13.0 - 17.0 g/dL   HCT 36.8 (L) 39.0 - 52.0 %   MCV 95.1 80.0 - 100.0 fL   MCH 30.5 26.0 - 34.0 pg   MCHC 32.1 30.0 - 36.0 g/dL   RDW 11.8 11.5 - 15.5 %   Platelets 194 150 - 400 K/uL   nRBC 0.0 0.0 - 0.2 %    Comment: Performed at [facility], [place] [address] [place], [place] [address]  Comprehensive metabolic panel in am     Status: Abnormal   Collection Time: 04/25/19  2:11 AM  Result Value Ref Range   Sodium 139 135 - 145 mmol/L   Potassium 4.1 3.5 - 5.1 mmol/L   Chloride 105 98 - 111 mmol/L   CO2 26 22 - 32 mmol/L   Glucose, Bld 117 (H) 70 - 99 mg/dL   BUN 16 6 - 20 mg/dL   Creatinine, Ser 0.84 0.61 - 1.24 mg/dL   Calcium  8.7 (L) 8.9 - 10.3 mg/dL   Total Protein 6.8 6.5 - 8.1 g/dL   Albumin 3.2 (L) 3.5 - 5.0 g/dL   AST 214 (H) 15 - 41 U/L   ALT 86 (H) 0 - 44 U/L   Alkaline Phosphatase 72 38 - 126 U/L   Total Bilirubin 1.0 0.3 - 1.2 mg/dL   GFR calc non Af Amer >60 >60 mL/min   GFR calc Af Amer >60 >60 mL/min   Anion gap 8 5 - 15    Comment: Performed at [facility], [place] [address] [place], [place] [address]  Glucose, capillary     Status: Abnormal   Collection Time: 04/25/19  4:09 AM  Result Value Ref Range   Glucose-Capillary 148 (H) 70 - 99 mg/dL   Comment 1 Notify RN    Comment 2 Document in Chart   Glucose, capillary     Status: Abnormal   Collection Time: 04/25/19  7:40 AM  Result Value Ref Range   Glucose-Capillary 100 (H) 70 - 99 mg/dL   Comment 1 Notify RN    Comment 2 Document in Chart   Glucose, capillary     Status: Abnormal   Collection Time: 04/25/19 11:33 AM  Result Value Ref Range   Glucose-Capillary 107 (H) 70 - 99 mg/dL   Comment 1 Notify RN    Comment 2 Document in Chart     Current Facility-Administered Medications  Medication Dose Route Frequency Provider Last Rate Last Admin  . 0.9 %  sodium chloride infusion  250 mL Intravenous Continuous Noemi Chapel P, DO      . 0.9 %  sodium chloride infusion  250 mL Intravenous Continuous Julian Hy, DO   Stopped at 04/23/19 [phone]  . acetaminophen (TYLENOL) 160 MG/5ML solution 650 mg  650 mg Oral Q6H PRN Julian Hy, DO   650 mg at 04/25/19 0316  . albuterol (PROVENTIL) (2.5 MG/3ML) 0.083% nebulizer solution 2.5 mg  2.5 mg Nebulization Q4H PRN Noemi Chapel P, DO   2.5 mg at 04/24/19 1810  . Ampicillin-Sulbactam (UNASYN) 3 g in sodium chloride 0.9 % 100 mL IVPB  3 g Intravenous Q6H Lannan, Margo T, MD 200 mL/hr at 04/25/19 1426 3 g at 04/25/19 1426  . chlorhexidine gluconate (MEDLINE KIT) (PERIDEX) 0.12 % solution 15 mL  15 mL Mouth Rinse BID Daryll Brod T, MD   15 mL at 04/24/19 9767  . Chlorhexidine  Gluconate Cloth 2 % PADS 6 each  6 each Topical  Daily Jacalyn Lefevre, MD   6 each at 04/25/19 1028  . clonazePAM (KLONOPIN) tablet 1 mg  1 mg Oral TID Noemi Chapel P, DO   1 mg at 04/25/19 1028  . enoxaparin (LOVENOX) injection 40 mg  40 mg Subcutaneous Q24H Daryll Brod T, MD   40 mg at 04/25/19 1029  . feeding supplement (PRO-STAT SUGAR FREE 64) liquid 30 mL  30 mL Oral QID Noemi Chapel P, DO   30 mL at 04/25/19 1219  . guaiFENesin (MUCINEX) 12 hr tablet 600 mg  600 mg Oral BID Noemi Chapel P, DO   600 mg at 04/25/19 1029  . insulin aspart (novoLOG) injection 0-15 Units  0-15 Units Subcutaneous Q4H Jacalyn Lefevre, MD   2 Units at 04/25/19 0414  . insulin detemir (LEVEMIR) injection 5 Units  5 Units Subcutaneous QHS Erick Colace, NP   5 Units at 04/24/19 2152  . labetalol (NORMODYNE) injection 10 mg  10 mg Intravenous Q4H PRN Judd Lien, MD   10 mg at 04/25/19 0322  . MEDLINE mouth rinse  15 mL Mouth Rinse 10 times per day Daryll Brod T, MD   15 mL at 04/25/19 0138  . multivitamin with minerals tablet 1 tablet  1 tablet Oral Daily Julian Hy, DO      . sennosides (SENOKOT) 8.8 MG/5ML syrup 5 mL  5 mL Oral BID PRN Julian Hy, DO      . sertraline (ZOLOFT) tablet 50 mg  50 mg Oral Daily Noemi Chapel P, DO   50 mg at 04/25/19 1028  . tamsulosin (FLOMAX) capsule 0.4 mg  0.4 mg Oral Daily Julian Hy, DO   0.4 mg at 04/25/19 1028    Musculoskeletal: Strength & Muscle Tone: unable to assess Gait & Station: unable to assess Patient leans: unable to assess  Psychiatric Specialty Exam: Physical Exam  Review of Systems  Blood pressure (!) 151/79, pulse 95, temperature 98.5 F (36.9 C), temperature source Axillary, resp. rate 18, height '6\' 2"'  (1.88 m), weight 118.7 kg, SpO2 95 %.Body mass index is 33.6 kg/m.  General Appearance: Casual and Fairly Groomed  Eye Contact:  Good  Speech:  Clear and Coherent and Normal Rate  Volume:  Normal  Mood:  Anxious  Affect:   Appropriate and Congruent  Thought Process:  Coherent, Goal Directed and Descriptions of Associations: Intact  Orientation:  Full (Time, Place, and Person)  Thought Content:  WDL and Logical  Suicidal Thoughts:  No  Homicidal Thoughts:  No  Memory:  Immediate;   Good Recent;   Good Remote;   Good  Judgement:  Fair  Insight:  Fair  Psychomotor Activity:  Normal  Concentration:  Concentration: Good and Attention Span: Good  Recall:  Good  Fund of Knowledge:  Good  Language:  Good  Akathisia:  No  Handed:  Right  AIMS (if indicated):     Assets:  Communication Skills Desire for Improvement Financial Resources/Insurance Housing Physical Health Resilience Social Support  ADL's:  Intact  Cognition:  WNL  Sleep:        Treatment Plan Summary: Recommend consider tapering benzodiazepine as well as opiate medications based on overdose potential and possible substance use disorder. Plan cleared by psychiatry  Recommend follow-up with outpatient psychiatry, recommend follow-up with substance use treatment resources.  Disposition: No evidence of imminent risk to self or others at present.   Patient does not meet criteria for psychiatric inpatient admission. Supportive therapy provided  about ongoing stressors. Discussed crisis plan, support from social network, calling 911, coming to the Emergency Department, and calling Suicide Hotline.  Perry Kluver, FNP 04/25/2019 2:29 PM   Attest to NP Note

## 2019-04-25 NOTE — Progress Notes (Signed)
[facility] Progress Note Patient Name: Perry Li DOB: [birth year] MRN: [id number]   Date of Service  04/25/2019  HPI/Events of Note  Patient c/o restlessness and muscle pain. Creatinine = 1.39.  eICU Interventions  Will order: 1. Motrin 400 mg PO X 1 now. 2. Increase ceiling on Precedex IV infusion to 0.8 mcg/kg/min.      Intervention Category Major Interventions: Other:;Delirium, psychosis, severe agitation - evaluation and management  Deondra Wigger Eugene 04/25/2019, 1:28 AM

## 2019-04-25 NOTE — Progress Notes (Addendum)
NAME:  Perry Li, MRN:  [id number], DOB:  Aug 31, 1985, LOS: 3 ADMISSION DATE:  04/22/2019, CONSULTATION DATE: 04/22/2019 REFERRING MD: [facility] ED, CHIEF COMPLAINT: Acute respiratory failure  Brief History   33 year old male with a history of diabetes, anxiety, and high cholesterol who presented after being found after a suspected drug overdose, received Narcan, and then had an aspiration event leading to acute hypoxia.  Intubated for hypoxic respiratory failure and airway protection.  Admitting to ICU, [facility].    Past Medical History   Past Medical History:  Diagnosis Date  . Anxiety   . Chest pain   . High cholesterol   . Insomnia   . SOB (shortness of breath)      Significant Hospital Events   Intubated 12/20: placed  In prone position  12/21: back in supine position. sats maintaining changing sedation goals to light, serum creatinine doubled 12/22 extubated  Consults:  Critical care  Procedures:  Intubation 12/20  Significant Diagnostic Tests:  Chest x-ray 12/20-Central bilateral infiltrates  Micro Data:  12/20 blood cultures x2 >> Covid PCR negative Flu a and B negative Trach aspirate 12/20>> normal flora  Antimicrobials:  Unasyn, 12/20>>  Interim history/subjective:  Awake, continues to complain of achiness and feeling bad.  Ongoing anxiety.  Tolerating clear liquids.  Objective   Blood pressure (!) 158/76, pulse 92, temperature 98.5 F (36.9 C), temperature source Axillary, resp. rate (!) 36, height 6\' 2"  (1.88 m), weight 118.7 kg, SpO2 98 %.        Intake/Output Summary (Last 24 hours) at 04/25/2019 0934 Last data filed at 04/25/2019 04/27/2019 Gross per 24 hour  Intake 911.39 ml  Output 1750 ml  Net -838.61 ml   Filed Weights   04/23/19 0356 04/24/19 0446 04/25/19 0500  Weight: 116.8 kg 119.2 kg 118.7 kg    Examination: General: Young man sitting up in chair no acute distress HENT: [place]/AT, eyes anicteric Pulm: Inspiratory squeaks  right upper lobe, reduced bibasilar breath sounds.  Occasionally productive cough Cardio: Regular rate and rhythm, no murmurs abd obese, soft, nondistended Ext no peripheral edema or cyanosis Neuro awake and alert, normal speech, moving all extremities spontaneously Psych-intermittently uncooperative and agitated   Resolved Hospital Problem list   Medication related hypotension (resolved)   Assessment & Plan:  33 year old male with a history of diabetes, hyperlipidemia, anxiety who presented with a acute encephalopathy due to a drug overdose which responded to Narcan.  He subsequently had emesis with aspiration causing respiratory failure.  Intubated for acute hypoxic respiratory failure secondary to aspiration pneumonia.  Acute hypoxic and hypercarbic respiratory failure due to drug overdose and aspiration pneumonia. ARDS (P:F 116)  Extubated 12/22 Plan Continue to wean supplemental oxygen I-S and flutter Nebs as needed as he has been refusing them frequently Starting regular diet, monitor for signs of aspiration Continue Unasyn, plan for 7 days total of antibiotics for pneumonia Likely stable for stepdown to the floor later today   Agitation, history of anxiety Restart Klonopin at home dose 1 mg 3 times daily If he requires additional PRNs, will plan to use Haldol IV or IM Stop Precedex Starting Zoloft 50 mg daily Psychiatry consult  AKI-back to baseline renal function, has proteinuria predating this admission, likely has diabetes induced renal dysfunction. Suspect hemodynamically mediated.  Serum creatinine improved Plan Avoid nephrotoxic meds; would prefer to avoid NSAIDs Optimize diabetes control  Drug overdose: Benzo or trazodone overdose, ethanol, opiates -Likely this was an accidental overdose, but he has had  what appears to be freq refill requests for his clonazepam  Plan DC Precedex Clonazepam at PTA dose-1 mg 3 times daily Consult to psychiatry for assistance  with managing his chronic anxiety to prevent future self-medicating misadventures  Diabetes: History of DKA, not on insulin at home.  On PTA Metformin Hemoglobin A1c 6.1 Plan Continue holding PTA Metformin Continue Levemir and sliding scale insulin as needed Diabetic diet  Elevated transaminase levels-chronic Plan Needs outpatient evaluation; concern for NASH  Best practice:  Diet: Diabetic Pain/Anxiety/Delirium protocol (if indicated): Yes VAP protocol (if indicated): n/a DVT prophylaxis: Lovenox GI prophylaxis: n/a Glucose control: SSI Mobility: Ambulation with assistance Code Status: Full Family Communication: mother updated at bedside Disposition: Likely stable for telemetry later today  Julian Hy, DO 04/25/19 9:49 AM [facility]    Discussed his care with [facility], who will assume his care tomorrow.  Julian Hy, DO 04/25/19 12:41 PM [facility]

## 2019-04-25 NOTE — Progress Notes (Signed)
Received call form family that pt called them to day he could not breath.  On assessment of patient SATS 98% resp shallow and 28.  Lungs clear but decreased.  Encouraged use of IS and flutter valve, he was resistant to using them. Patient has wesk cough.  Placed call to RT to see if a breathing tx might help him feel like he could breath better and was informed that he has been refusing his treatments.  Staying at bedside patient for now, emotional tx provided.

## 2019-04-25 NOTE — Progress Notes (Signed)
Nutrition Follow-up  RD working remotely.   DOCUMENTATION CODES:   Obesity unspecified  INTERVENTION:  - will order 30 mL Prostat QID, each supplement provides 100 kcal and 15 grams of protein. - will order daily multivitamin with minerals. - will continue to monitor for additional nutrition-related needs.    NUTRITION DIAGNOSIS:   Increased nutrient needs related to acute illness as evidenced by estimated needs. -revised  GOAL:   Patient will meet greater than or equal to 90% of their needs -unmet at this time  MONITOR:   PO intake, Supplement acceptance, Labs, Weight trends  ASSESSMENT:   33 year old male with a history of diabetes, anxiety, and high cholesterol who presented after being found after a suspected drug overdose, received Narcan, and then had an aspiration event leading to acute hypoxia.  Intubated for hypoxic respiratory failure and airway protection.  Admitting to ICU, [facility].  Patient was assessed and TF ordered while he was on the vent on 12/20. Patient was extubated yesterday (12/22) around 0920 and OGT removed at that time. Diet was advanced from NPO to Carb Modified today at 0723. No intakes documented at this time and patient location listed as [facility]. Estimated nutrition needs updated. Weight has been stable since admission on 12/23.   Per notes: - acute hypoxic and hypercarbic respiratory failure d/t drug OD and aspiration PNA--7 days abx - agitation with hx of anxiety - AKI--resolved   Labs reviewed; CBGs: 148 and 100 mg/dl, Ca: 8.7 mg/dl, Phos: 2 mg/dl, LFTs elevated. Medications reviewed; sliding scale novolog, 5 units levemir/day, 1 packet phos-nak x2 doses 12/22 and x2 doses 12/23     NUTRITION - FOCUSED PHYSICAL EXAM:  unable to complete at this time.   Diet Order:   Diet Order            Diet Carb Modified Fluid consistency: Thin; Room service appropriate? Yes  Diet effective now              EDUCATION NEEDS:    No education needs have been identified at this time  Skin:  Skin Assessment: Reviewed RN Assessment  Last BM:  PTA  Height:   Ht Readings from Last 1 Encounters:  04/22/19 6\' 2"  (1.88 m)    Weight:   Wt Readings from Last 1 Encounters:  04/25/19 118.7 kg    Ideal Body Weight:  86.3 kg  BMI:  Body mass index is 33.6 kg/m.  Estimated Nutritional Needs:   Kcal:  0962-8366 kcal  Protein:  110-125 grams  Fluid:  >/= 2.2 L/day     Jarome Matin, MS, RD, LDN, [facility] Inpatient Clinical Dietitian Pager # [phone] After hours/weekend pager # [phone]

## 2019-04-26 ENCOUNTER — Other Ambulatory Visit: Payer: Self-pay

## 2019-04-26 LAB — CBC WITH DIFFERENTIAL/PLATELET
Abs Immature Granulocytes: 0.08 10*3/uL — ABNORMAL HIGH (ref 0.00–0.07)
Basophils Absolute: 0.1 10*3/uL (ref 0.0–0.1)
Basophils Relative: 0 %
Eosinophils Absolute: 0 10*3/uL (ref 0.0–0.5)
Eosinophils Relative: 0 %
HCT: 40 % (ref 39.0–52.0)
Hemoglobin: 13.1 g/dL (ref 13.0–17.0)
Immature Granulocytes: 1 %
Lymphocytes Relative: 11 %
Lymphs Abs: 1.3 10*3/uL (ref 0.7–4.0)
MCH: 30.5 pg (ref 26.0–34.0)
MCHC: 32.8 g/dL (ref 30.0–36.0)
MCV: 93 fL (ref 80.0–100.0)
Monocytes Absolute: 0.8 10*3/uL (ref 0.1–1.0)
Monocytes Relative: 7 %
Neutro Abs: 9.4 10*3/uL — ABNORMAL HIGH (ref 1.7–7.7)
Neutrophils Relative %: 81 %
Platelets: 262 10*3/uL (ref 150–400)
RBC: 4.3 MIL/uL (ref 4.22–5.81)
RDW: 11.7 % (ref 11.5–15.5)
WBC: 11.6 10*3/uL — ABNORMAL HIGH (ref 4.0–10.5)
nRBC: 0 % (ref 0.0–0.2)

## 2019-04-26 LAB — BASIC METABOLIC PANEL
Anion gap: 15 (ref 5–15)
BUN: 19 mg/dL (ref 6–20)
CO2: 23 mmol/L (ref 22–32)
Calcium: 8.8 mg/dL — ABNORMAL LOW (ref 8.9–10.3)
Chloride: 104 mmol/L (ref 98–111)
Creatinine, Ser: 0.85 mg/dL (ref 0.61–1.24)
GFR calc Af Amer: >60 mL/min (ref >60)
GFR calc non Af Amer: >60 mL/min (ref >60)
Glucose, Bld: 126 mg/dL — ABNORMAL HIGH (ref 70–99)
Potassium: 3.1 mmol/L — ABNORMAL LOW (ref 3.5–5.1)
Sodium: 142 mmol/L (ref 135–145)

## 2019-04-26 LAB — GLUCOSE, CAPILLARY
Glucose-Capillary: 125 mg/dL — ABNORMAL HIGH (ref 70–99)
Glucose-Capillary: 122 mg/dL — ABNORMAL HIGH (ref 70–99)
Glucose-Capillary: 125 mg/dL — ABNORMAL HIGH (ref 70–99)
Glucose-Capillary: 120 mg/dL — ABNORMAL HIGH (ref 70–99)
Glucose-Capillary: 124 mg/dL — ABNORMAL HIGH (ref 70–99)

## 2019-04-26 LAB — MAGNESIUM: Magnesium: 2 mg/dL (ref 1.7–2.4)

## 2019-04-26 MED ORDER — HALOPERIDOL LACTATE 5 MG/ML IJ SOLN: 1.0000 mg | Freq: Four times a day (QID) | INTRAMUSCULAR | Status: DC | PRN

## 2019-04-26 MED ORDER — IPRATROPIUM-ALBUTEROL 0.5-2.5 (3) MG/3ML IN SOLN: 3.0000 mL | Freq: Four times a day (QID) | RESPIRATORY_TRACT | Status: DC | PRN

## 2019-04-26 MED ORDER — TRAZODONE HCL 50 MG PO TABS
50.0000 mg | ORAL_TABLET | Freq: Every day | ORAL | Status: DC
  Administered 2019-04-26 – 2019-04-28 (×3): 50 mg via ORAL
  Filled 2019-04-26 (×3): qty 1

## 2019-04-26 NOTE — Progress Notes (Signed)
Brief history: 33 year old male with a history of diabetes, anxiety, and high cholesterol who presented after being found after a suspected drug overdose, received Narcan, and then had an aspiration event leading to acute hypoxia.  Intubated for hypoxic respiratory failure and airway protection.  Admitting to ICU, [place] [facility].   Subjective: Patient says he is feeling better from breathing perspective.  He also denied any fever.  However was unable to sleep overnight.  He was taking trazodone which was not restarted.  She denies any suicidal or homicidal thoughts or ideas.  Mother was at the bedside.  Objective: Vital signs in last 24 hours: Temp:  [97.7 F (36.5 C)-99.4 F (37.4 C)] 99.3 F (37.4 C) (12/24 1416) Pulse Rate:  [70-89] 72 (12/24 1416) Resp:  [14-25] 18 (12/24 1416) BP: (126-143)/(61-81) 128/65 (12/24 1416) SpO2:  [93 %-96 %] 96 % (12/24 1416)  Intake/Output from previous day: 12/23 0701 - 12/24 0700 In: 596.7 [P.O.:420] Out: 900 [Urine:900] Intake/Output this shift: Total I/O In: 420 [P.O.:120; IV Piggyback:300] Out: 100 [Urine:100]  General: Leaning on right side on bed; appears very tired HENT: [place]/AT, eyes anicteric Pulm: Inspiratory squeaks right upper lobe, reduced bibasilar breath sounds.  Occasionally productive cough; mild scattered wheezing Cardio: Regular rate and rhythm, no murmurs abd obese, soft, nondistended Ext no peripheral edema or cyanosis Neuro awake and alert, normal speech, moving all extremities spontaneously Psych-appears very calm.  Alert and oriented.  Denies any suicidal or homicidal thoughts or ideas.  Results for orders placed or performed during the hospital encounter of 04/22/19 (from the past 24 hour(s))  Glucose, capillary     Status: Abnormal   Collection Time: 04/25/19  4:27 PM  Result Value Ref Range   Glucose-Capillary 117 (H) 70 - 99 mg/dL  Glucose, capillary     Status: Abnormal   Collection Time: 04/25/19  7:57 PM  Result  Value Ref Range   Glucose-Capillary 170 (H) 70 - 99 mg/dL  Glucose, capillary     Status: Abnormal   Collection Time: 04/25/19 11:40 PM  Result Value Ref Range   Glucose-Capillary 126 (H) 70 - 99 mg/dL  Glucose, capillary     Status: Abnormal   Collection Time: 04/26/19  3:45 AM  Result Value Ref Range   Glucose-Capillary 125 (H) 70 - 99 mg/dL  Glucose, capillary     Status: Abnormal   Collection Time: 04/26/19  7:47 AM  Result Value Ref Range   Glucose-Capillary 122 (H) 70 - 99 mg/dL   Comment 1 Notify RN    Comment 2 Document in Chart   CBC with Differential/Platelet     Status: Abnormal   Collection Time: 04/26/19  8:42 AM  Result Value Ref Range   WBC 11.6 (H) 4.0 - 10.5 K/uL   RBC 4.30 4.22 - 5.81 MIL/uL   Hemoglobin 13.1 13.0 - 17.0 g/dL   HCT 40.0 39.0 - 52.0 %   MCV 93.0 80.0 - 100.0 fL   MCH 30.5 26.0 - 34.0 pg   MCHC 32.8 30.0 - 36.0 g/dL   RDW 11.7 11.5 - 15.5 %   Platelets 262 150 - 400 K/uL   nRBC 0.0 0.0 - 0.2 %   Neutrophils Relative % 81 %   Neutro Abs 9.4 (H) 1.7 - 7.7 K/uL   Lymphocytes Relative 11 %   Lymphs Abs 1.3 0.7 - 4.0 K/uL   Monocytes Relative 7 %   Monocytes Absolute 0.8 0.1 - 1.0 K/uL   Eosinophils Relative 0 %  Eosinophils Absolute 0.0 0.0 - 0.5 K/uL   Basophils Relative 0 %   Basophils Absolute 0.1 0.0 - 0.1 K/uL   Immature Granulocytes 1 %   Abs Immature Granulocytes 0.08 (H) 0.00 - 0.07 K/uL  Glucose, capillary     Status: Abnormal   Collection Time: 04/26/19 12:14 PM  Result Value Ref Range   Glucose-Capillary 125 (H) 70 - 99 mg/dL    Studies/Results: DG Abdomen 1 View  Result Date: 04/22/2019 CLINICAL DATA:  Orogastric tube placement. EXAM: ABDOMEN - 1 VIEW COMPARISON:  None. FINDINGS: Tip and side port of the enteric tube below the diaphragm in the stomach. No bowel dilatation in the upper abdomen. IMPRESSION: Tip and side port of the enteric tube below the diaphragm in the stomach. Electronically Signed   By: Keith Rake  M.D.   On: 04/22/2019 02:15   DG Chest Port 1 View  Result Date: 04/25/2019 CLINICAL DATA:  Follow-up pneumonia. EXAM: PORTABLE CHEST 1 VIEW COMPARISON:  Chest x-rays dated 04/24/2019 and 04/23/2019. FINDINGS: Endotracheal tube and enteric tube have been removed. Heart size and mediastinal contours are grossly stable. Patchy bilateral perihilar airspace opacities are stable. No pleural effusion or pneumothorax is seen. Osseous structures about the chest are unremarkable. Elevation of the RIGHT hemidiaphragm IMPRESSION: 1. Interval removal of endotracheal tube and enteric tube. 2. Patchy bilateral perihilar airspace opacities are stable, compatible with given history of pneumonia, perhaps with superimposed central pulmonary vascular congestion. 3. Elevation of the RIGHT hemidiaphragm, likely accentuated by patient positioning. Electronically Signed   By: Franki Cabot M.D.   On: 04/25/2019 08:53   DG Chest Port 1 View  Result Date: 04/24/2019 CLINICAL DATA:  Respiratory failure. EXAM: PORTABLE CHEST 1 VIEW COMPARISON:  04/23/2019 FINDINGS: The endotracheal tube is 4 cm above the carina. The NG tube is coursing down the esophagus and into the stomach. Persistent but slightly improved perihilar predominant interstitial and airspace process, likely pulmonary edema. No focal infiltrates or effusions. IMPRESSION: 1. Stable support apparatus. 2. Persistent but slightly improved perihilar predominant interstitial and airspace process. Electronically Signed   By: Marijo Sanes M.D.   On: 04/24/2019 06:04   DG CHEST PORT 1 VIEW  Result Date: 04/23/2019 CLINICAL DATA:  33 year old male status post intubation. EXAM: PORTABLE CHEST 1 VIEW COMPARISON:  Chest radiograph dated 04/22/2019. FINDINGS: Endotracheal tube with tip approximately 3.8 cm above the carina. An enteric tube extends below the diaphragm with tip beyond the inferior margin of the image. Bilateral confluent airspace opacities, left greater right  with slight interval improvement since the prior radiograph. No pleural effusion or pneumothorax. Stable cardiac silhouette. No acute osseous pathology. IMPRESSION: 1. Endotracheal tube above the carina. 2. Bilateral confluent airspace opacities with slight interval improvement since the prior radiograph. Follow-up recommended. Electronically Signed   By: Anner Crete M.D.   On: 04/23/2019 19:31   DG CHEST PORT 1 VIEW  Result Date: 04/22/2019 CLINICAL DATA:  ET tube repositioning EXAM: PORTABLE CHEST 1 VIEW COMPARISON:  April 22, 2019 FINDINGS: The ET tube is been advanced and now terminates in the mid trachea in good position. The NG tube terminates below today's film. Bilateral pulmonary infiltrates are stable. No pneumothorax. No other abnormalities. IMPRESSION: 1. The ETT is in good position. The NG tube terminates below today's film. 2. Bilateral pulmonary infiltrates, left greater than right, are stable since the study performed earlier today at 4:07 p.m. Electronically Signed   By: Dorise Bullion III M.D   On: 04/22/2019 16:49  DG CHEST PORT 1 VIEW  Result Date: 04/22/2019 CLINICAL DATA:  ET tube repositioning EXAM: PORTABLE CHEST 1 VIEW COMPARISON:  April 22, 2019 at 1:54 a.m. FINDINGS: The ETT has been partially withdrawn in the interval terminating 3 cm below the thoracic inlet and 7.9 cm above the carina. The NG tube terminates below today's film. No pneumothorax. Bilateral perihilar pulmonary infiltrates are stable on the right and worsened on the left in the interval. The cardiomediastinal silhouette is stable. IMPRESSION: 1. The ET tube has been partially withdrawn in the interval. The distal tip is now 3 cm below the thoracic inlet in adequate position. However, the ETT is relatively high riding, 7.9 cm above the carina. Consider advancing 2 or 3 cm. 2. The NG tube terminates below today's film. 3. Stable right pulmonary infiltrate. 4. Worsening left pulmonary infiltrate.  Electronically Signed   By: Dorise Bullion III M.D   On: 04/22/2019 16:31   DG Chest Portable 1 View  Result Date: 04/22/2019 CLINICAL DATA:  Intubation. Orogastric tube placement. EXAM: PORTABLE CHEST 1 VIEW COMPARISON:  None. FINDINGS: Endotracheal tube tip at the thoracic inlet. Enteric tube in place tip below the diaphragm not included in the field of view. Symmetric low lung volumes. Confluent bilateral perihilar opacities. Heart is normal in size. No pleural fluid or pneumothorax. No acute osseous abnormalities are seen. IMPRESSION: 1. Endotracheal tube tip at the thoracic inlet. Enteric tube in place tip below the diaphragm not included in the field of view. 2. Confluent bilateral perihilar opacities, favor aspiration/pneumonia over pulmonary edema. Electronically Signed   By: Keith Rake M.D.   On: 04/22/2019 02:15    Scheduled Meds: . chlorhexidine gluconate (MEDLINE KIT)  15 mL Mouth Rinse BID  . Chlorhexidine Gluconate Cloth  6 each Topical Daily  . clonazePAM  1 mg Oral TID  . enoxaparin (LOVENOX) injection  40 mg Subcutaneous Q24H  . feeding supplement (PRO-STAT SUGAR FREE 64)  30 mL Oral QID  . guaiFENesin  600 mg Oral BID  . insulin aspart  0-15 Units Subcutaneous Q4H  . insulin detemir  5 Units Subcutaneous QHS  . multivitamin with minerals  1 tablet Oral Daily  . sertraline  50 mg Oral Daily  . tamsulosin  0.4 mg Oral Daily  . traZODone  50 mg Oral QHS   Continuous Infusions: . sodium chloride    . sodium chloride Stopped (04/23/19 8299)  . ampicillin-sulbactam (UNASYN) IV Stopped (04/26/19 1142)   PRN Meds:acetaminophen (TYLENOL) oral liquid 160 mg/5 mL, albuterol, ipratropium-albuterol, labetalol, sennosides  Assessment/Plan: Acute hypoxic and hypercarbic respiratory failure due to drug overdose and aspiration pneumonia.  - ARDS Extubated on 12/22 Currently on 2 to 3 L of supplemental oxygen: Continue to wean off supplemental oxygen Continue I-S and  flutter Nebs as needed; patient agreed to receive nebulizer treatment this morning Starting regular diet, monitor for signs of aspiration Continue Unasyn, plan for 7 days total of antibiotics for pneumonia  Agitation, history of anxiety -Was very calm and cooperative this morning.  Per as per record he was very agitated yesterday.  Psychiatry was consulted.  And patient did not meet for involuntary hold or inpatient psychiatric admission at this time.  Psychiatry signed off. Restart Klonopin at home dose 1 mg 3 times daily -May add IM Haldol as needed Continue Zoloft 50 mg daily -Due to follow crisis plan as discussed with psychiatrist  AKI-resolved to baseline - back to baseline renal function, has proteinuria predating this admission, likely has  diabetes induced renal dysfunction. Avoid nephrotoxic meds; would prefer to avoid NSAIDs Optimize diabetes control -Monitor renal function  Drug overdose: Benzo or trazodone overdose, ethanol, opiates -Likely this was an accidental overdose, but he has had what appears to be freq refill requests for his clonazepam  Currently status post Precedex Resumed home clonazepam at PTA dose-1 mg 3 times daily Psychiatry was consulted.  Did not think any further management needed. -May need psych outpatient follow-up following discharge  Diabetes: Blood glucose much better controlled currently -  history of DKA, not on insulin at home.  Home Metformin Hemoglobin A1c 6.1 Continue to hold Metformin Continue Levemir and sliding scale insulin as needed Diabetic diet  Elevated transaminase levels-chronic -Patient has no complaints at this time - Needs outpatient evaluation; concern for NASH -May LFTs  Diet: Diabetic Mobility: Ambulation with assistance.  Still PT OT Code Status: Full Family Communication: mother updated at bedside Disposition: To home when stable  Diarrhea: For a couple of days; a few episodes so far - No obvious S/S for C  Diff at this point  - Has been on Unasyn  - Will monitor for now   DVT Prophylaxis SCD's   Diet: Carb Mod   Mobility: PT/OT to follow    LOS: 4 days   Perry Li

## 2019-04-26 NOTE — Progress Notes (Signed)
Encourage patient to use incentive spirometer and he replied " I did that this morning." Will to encourage incentive spirometer use throughout the day.

## 2019-04-27 ENCOUNTER — Inpatient Hospital Stay (HOSPITAL_COMMUNITY): Payer: BC Managed Care – PPO

## 2019-04-27 LAB — GLUCOSE, CAPILLARY
Glucose-Capillary: 101 mg/dL — ABNORMAL HIGH (ref 70–99)
Glucose-Capillary: 115 mg/dL — ABNORMAL HIGH (ref 70–99)
Glucose-Capillary: 122 mg/dL — ABNORMAL HIGH (ref 70–99)
Glucose-Capillary: 129 mg/dL — ABNORMAL HIGH (ref 70–99)
Glucose-Capillary: 141 mg/dL — ABNORMAL HIGH (ref 70–99)
Glucose-Capillary: 154 mg/dL — ABNORMAL HIGH (ref 70–99)
Glucose-Capillary: 176 mg/dL — ABNORMAL HIGH (ref 70–99)

## 2019-04-27 LAB — COMPREHENSIVE METABOLIC PANEL
ALT: 64 U/L — ABNORMAL HIGH (ref 0–44)
AST: 43 U/L — ABNORMAL HIGH (ref 15–41)
Albumin: 2.9 g/dL — ABNORMAL LOW (ref 3.5–5.0)
Alkaline Phosphatase: 61 U/L (ref 38–126)
Anion gap: 9 (ref 5–15)
BUN: 19 mg/dL (ref 6–20)
CO2: 23 mmol/L (ref 22–32)
Calcium: 8.3 mg/dL — ABNORMAL LOW (ref 8.9–10.3)
Chloride: 103 mmol/L (ref 98–111)
Creatinine, Ser: 1 mg/dL (ref 0.61–1.24)
GFR calc Af Amer: >60 mL/min (ref >60)
GFR calc non Af Amer: >60 mL/min (ref >60)
Glucose, Bld: 227 mg/dL — ABNORMAL HIGH (ref 70–99)
Potassium: 2.7 mmol/L — CL (ref 3.5–5.1)
Sodium: 135 mmol/L (ref 135–145)
Total Bilirubin: 0.4 mg/dL (ref 0.3–1.2)
Total Protein: 6.4 g/dL — ABNORMAL LOW (ref 6.5–8.1)

## 2019-04-27 LAB — CULTURE, BLOOD (ROUTINE X 2)
Culture: NO GROWTH
Culture: NO GROWTH
Culture: NO GROWTH
Special Requests: ADEQUATE

## 2019-04-27 LAB — CBC WITH DIFFERENTIAL/PLATELET
Abs Immature Granulocytes: 0.18 10*3/uL — ABNORMAL HIGH (ref 0.00–0.07)
Basophils Absolute: 0 10*3/uL (ref 0.0–0.1)
Basophils Relative: 0 %
Eosinophils Absolute: 0 10*3/uL (ref 0.0–0.5)
Eosinophils Relative: 0 %
HCT: 39.6 % (ref 39.0–52.0)
Hemoglobin: 12.9 g/dL — ABNORMAL LOW (ref 13.0–17.0)
Immature Granulocytes: 2 %
Lymphocytes Relative: 12 %
Lymphs Abs: 1.4 10*3/uL (ref 0.7–4.0)
MCH: 30.5 pg (ref 26.0–34.0)
MCHC: 32.6 g/dL (ref 30.0–36.0)
MCV: 93.6 fL (ref 80.0–100.0)
Monocytes Absolute: 0.8 10*3/uL (ref 0.1–1.0)
Monocytes Relative: 7 %
Neutro Abs: 9.2 10*3/uL — ABNORMAL HIGH (ref 1.7–7.7)
Neutrophils Relative %: 79 %
Platelets: 290 10*3/uL (ref 150–400)
RBC: 4.23 MIL/uL (ref 4.22–5.81)
RDW: 11.7 % (ref 11.5–15.5)
WBC: 11.7 10*3/uL — ABNORMAL HIGH (ref 4.0–10.5)
nRBC: 0 % (ref 0.0–0.2)

## 2019-04-27 LAB — FIBRINOGEN: Fibrinogen: 783 mg/dL — ABNORMAL HIGH (ref 210–475)

## 2019-04-27 LAB — PHOSPHORUS: Phosphorus: 3.6 mg/dL (ref 2.5–4.6)

## 2019-04-27 LAB — D-DIMER, QUANTITATIVE: D-Dimer, Quant: 3.14 ug/mL-FEU — ABNORMAL HIGH (ref 0.00–0.50)

## 2019-04-27 LAB — SEDIMENTATION RATE: Sed Rate: 60 mm/hr — ABNORMAL HIGH (ref 0–16)

## 2019-04-27 LAB — LACTATE DEHYDROGENASE: LDH: 216 U/L — ABNORMAL HIGH (ref 98–192)

## 2019-04-27 LAB — SARS CORONAVIRUS 2 (TAT 6-24 HRS): SARS Coronavirus 2: NEGATIVE

## 2019-04-27 LAB — MAGNESIUM: Magnesium: 2 mg/dL (ref 1.7–2.4)

## 2019-04-27 LAB — FERRITIN: Ferritin: 180 ng/mL (ref 24–336)

## 2019-04-27 LAB — PROCALCITONIN: Procalcitonin: 0.59 ng/mL

## 2019-04-27 MED ORDER — FUROSEMIDE 10 MG/ML IJ SOLN
20.0000 mg | Freq: Once | INTRAMUSCULAR | Status: AC
  Administered 2019-04-27: 20 mg via INTRAVENOUS
  Filled 2019-04-27: qty 2

## 2019-04-27 MED ORDER — FUROSEMIDE 10 MG/ML IJ SOLN
INTRAMUSCULAR | Status: AC
  Administered 2019-04-27: 20 mg via INTRAVENOUS
  Filled 2019-04-27: qty 2

## 2019-04-27 MED ORDER — SILVER SULFADIAZINE 1 % EX CREA
TOPICAL_CREAM | Freq: Every day | CUTANEOUS | Status: DC
  Filled 2019-04-27: qty 50

## 2019-04-27 MED ORDER — IPRATROPIUM-ALBUTEROL 0.5-2.5 (3) MG/3ML IN SOLN
3.0000 mL | Freq: Three times a day (TID) | RESPIRATORY_TRACT | Status: DC
  Administered 2019-04-27 – 2019-04-28 (×2): 3 mL via RESPIRATORY_TRACT
  Filled 2019-04-27 (×2): qty 3

## 2019-04-27 MED ORDER — POTASSIUM CHLORIDE CRYS ER 20 MEQ PO TBCR
40.0000 meq | EXTENDED_RELEASE_TABLET | Freq: Two times a day (BID) | ORAL | Status: AC
  Administered 2019-04-27 (×2): 40 meq via ORAL
  Filled 2019-04-27 (×2): qty 2

## 2019-04-27 MED ORDER — IPRATROPIUM-ALBUTEROL 0.5-2.5 (3) MG/3ML IN SOLN
3.0000 mL | Freq: Four times a day (QID) | RESPIRATORY_TRACT | Status: DC
  Administered 2019-04-27: 3 mL via RESPIRATORY_TRACT
  Filled 2019-04-27: qty 3

## 2019-04-27 MED ORDER — FUROSEMIDE 10 MG/ML IJ SOLN: 40.0000 mg | Freq: Once | INTRAMUSCULAR | Status: DC

## 2019-04-27 MED ORDER — CHLORHEXIDINE GLUCONATE 0.12 % MT SOLN
OROMUCOSAL | Status: AC
  Filled 2019-04-27: qty 15

## 2019-04-27 MED ORDER — POTASSIUM CHLORIDE 10 MEQ/100ML IV SOLN
10.0000 meq | INTRAVENOUS | Status: AC
  Administered 2019-04-27 (×4): 10 meq via INTRAVENOUS
  Filled 2019-04-27 (×4): qty 100

## 2019-04-27 NOTE — Progress Notes (Signed)
PROGRESS NOTE    Perry Li  ZYS:[id number] DOB: May 01, 1986 DOA: 04/22/2019 PCP: Darreld Mclean, MD  Brief Narrative:  Patient is a 33 year old obese male with a past medical history significant for but not limited to diabetes mellitus, anxiety, hyperlipidemia who presented to the emergency room after being found after suspected drug overdose and he received Narcan and had an aspiration event leading to acute hypoxia with respiratory failure and he was intubated for airway protection.  He was admitted to ICU for acute hypoxic and hypercarbic respiratory failure due to drug overdose and aspiration pneumonia as patient went into hours and he is extubated 04/24/2019.  He was placed on supplemental oxygen is to be continued and has been ordered nebulizers but has been refusing them frequently.  He has been placed on Unasyn for 7 days and was transferred to the medical floor on 04/25/2019.  In the ICU he required Precedex which has now been stopped.  Psychiatry was consulted for further evaluation and they recommend considering tapering his benzodiazepine as well as opiate medications based on all low-dose potential and possible substance use disorder.  They felt that he was no evidence of imminent risk to self or others at present.  Continues to be short of breath and feels fatigued and weak.  Repeat chest x-ray shows possible viral process so we will repeat his COVID-19 test and check inflammatory markers likely this is all secondary to aspiration  Assessment & Plan:   Principal Problem:   Acute respiratory failure ([name]) Active Problems:   Drug overdose   Acute hypoxic and hypercarbic respiratory failure due to drug overdose and aspiration pneumonia along with ours status post extubation on 04/24/2019 now on supplemental oxygen via nasal cannula -Continue supplemental oxygen via nasal cannula and he is on 2 to 3 L of supplemental oxygen and wean as tolerated but almost immediately  desaturates when attempted weaning  -Continue aggressive Pulmicort and continue with Atrovent responder and flutter valve  -Continue with duo nebs, Brovana and budesonide; patient has refused nebulizer treatments in the past; he is now on scheduled DuoNeb 3 mils 3 times daily and on as needed every 6 -Added guaifenesin 6 mg p.o. twice daily -He was started on a regular diet -We will continue aspiration precautions -Continue antibiotics with Unasyn for total of 7 days -Chest x-ray this morning showed "Regressed bilateral perihilar interstitial and patchy opacity, particularly on the right since 04/25/2019. Residual mostly on the left. No superimposed pneumothorax or pleural effusion. No osseous abnormality identified." -Repeat COVID Testing -Continue to Monitor Respiratory Status Carefully -Procalcitonin Level was 0.59 -Give a dose of IV Lasix 20 mg  -WBC wetnf rom 17.0 -> 11.7 -Repeat CXR and CBC in AM   Aspiration Pneumonia -Treatment as above any status post extubation -Had a low grade Temp -Rule out Covid again and obtain inflammatory markers as he never had them on admission -Patient LDH was 216, ferritin level is 180, procalcitonin 0.59, 3.14, fibrinogen was 783 -May need a chest CT scan if not improving -Treatment as above -Repeat CXR in AM   Agitation and History of Anxiety -Remains calm and cooperative this morning again -Per report he was agitated the day before yesterday and psychiatry was consulted and he did not meet criteria for involuntary movement or inpatient psychiatric admission at this time and they have signed off -He was restarted on Klonopin at home dose of 1 mg p.o. 3 times daily and will need to wean and taper per psychiatry recommendations -Continue  sertraline 50 mg p.o. daily -If he continues to be agitated he may need IM Haldol as needed but he is status post Precedex administration in the ICU  AKI -Patient's AKI is resolved and he is back to his baseline  renal function-had proteinuria predating this admission likely has diabetes mellitus induced renal dysfunction -BUN/creatinine is now 9/0.85 -Avoid nephrotoxic medications, contrast dyes, hypotension and renally adjust medications Avoid NSAIDs X-optimize his diabetes mellitus control -Continue monitor and trend renal function repeat CMP in a.m.  Drug overdose; benzo or trazodone overdose, ethanol and opiates -Likely this was an accidental overdose but he has had what appears to be frequent refill request for his clonazepam -He was on Precedex -Resumed home clonazepam prior to admission dose 1 mg p.o. 3 times daily -Psychiatry was consulted and did not think any further management was needed and recommended weaning benzos slowly -May need outpatient psychiatric follow-up at discharge  Diabetes Mellitus Type 2 -Blood glucose was much better controlled -He has a history-b of DKA not on insulin at home -Home Metformin is currently being held Hemoglobin A1c was 6.1 -Continue long-acting Levemir sliding scale insulin as needed Continue with diabetes diet-  Hypokalemia  -Patient's K+ was 2.7 -Patient with p.o. potassium chloride 40 mEQ twice daily as well as IV 40 mEq KCl -Continue to Monitor and Replete as Necessary -Repeat CMP in AM   Abnormal LFT's, chronic -Patient's AST went from 214 -> 43 -Patient's ALT went from 86 -> 64 -Has no complaints at this time and likely needs outpatient evaluation concerning for NASH -Repeat and trend CMP in a.m. and if worsens will obtain a right upper quadrant ultrasound and acute hepatitis panel  Obesity -Estimated body mass index is 33.6 kg/m as calculated from the following:   Height as of this encounter: 6' 2" (1.88 m).   Weight as of this encounter: 118.7 kg., -Weight Loss and Dietary Counseling given   DVT prophylaxis: Enoxaparin 40 mg sq q24h Code Status: FULL CODE  Family Communication: Discussed with Mother at bedside  Disposition Plan:  Pending further improvement in Oxygen Status  Consultants:   PCCM Transfer  Procedures:  Intubation 12/20   Antimicrobials:  Anti-infectives (From admission, onward)   Start     Dose/Rate Route Frequency Ordered Stop   04/22/19 0900  Ampicillin-Sulbactam (UNASYN) 3 g in sodium chloride 0.9 % 100 mL IVPB     3 g 200 mL/hr over 30 Minutes Intravenous Every 6 hours 04/22/19 0450     04/22/19 0200  Ampicillin-Sulbactam (UNASYN) 3 g in sodium chloride 0.9 % 100 mL IVPB     3 g 200 mL/hr over 30 Minutes Intravenous  Once 04/22/19 0159 04/22/19 0310     Subjective: Patient examined at bedside and still feeling fatigued and a little bit short of breath.  Still wearing supplemental oxygen via nasal cannula. no nausea or vomiting.  Asking for something for the irritation caused by the tape from his ET tube on his face.  Mother at bedside and all questions were answered to their satisfaction.  No other concerns or points at this time.  Objective: Vitals:   04/26/19 2059 04/27/19 0605 04/27/19 1144 04/27/19 1348  BP: 133/77 (!) 147/71  140/75  Pulse: 61 (!) 55  (!) 58  Resp: _0 Temp: 98.4 F (36.9 C) 99.2 F (37.3 C)  97.8 F (36.6 C)  TempSrc: Oral Oral  Oral  SpO2: 96% 96% (!) 89% 95%  Weight:  Height:        Intake/Output Summary (Last 24 hours) at 04/27/2019 1729 Last data filed at 04/27/2019 1557 Gross per 24 hour  Intake 2280 ml  Output 600 ml  Net 1680 ml   Filed Weights   04/23/19 0356 04/24/19 0446 04/25/19 0500  Weight: 116.8 kg 119.2 kg 118.7 kg   Examination: Physical Exam:  Constitutional: WN/WD obese male in NAD and appears calm but a little uncomfortable  Eyes: Lids and conjunctivae normal, sclerae anicteric  ENMT: External Ears, Nose appear normal. Grossly normal hearing. Mucous membranes are moist. Neck: Appears normal, supple, no cervical masses, normal ROM, no appreciable thyromegaly; no JVD Respiratory: Diminihsed to auscultation  bilaterally with coarse breath sounds and crackles and some mild rhonchi. No appreciable wheezing. Normal respiratory effort and patient is not tachypenic. No accessory muscle use but is wearing supplemental O2 via [place] Cardiovascular: RRR, no murmurs / rubs / gallops. S1 and S2 auscultated. 1+ extremity edema.  Abdomen: Soft, non-tender, Distended.  Bowel sounds positive x4.  GU: Deferred. Musculoskeletal: No clubbing / cyanosis of digits/nails. No joint deformity upper and lower extremities.  Skin: No rashes, lesions, ulcers on a limited skin evaluation. No induration; Warm and dry.  Neurologic: CN 2-12 grossly intact with no focal deficits. Romberg sign and cerebellar reflexes not assessed.  Psychiatric: Normal judgment and insight. Alert and oriented x 3. Normal mood and appropriate affect.   Data Reviewed: I have personally reviewed following labs and imaging studies  CBC: Recent Labs  Lab 04/22/19 0229 04/23/19 0211 04/24/19 0216 04/25/19 0211 04/26/19 0842 04/27/19 0927  WBC 10.2 17.0* 13.3* 12.3* 11.6* 11.7*  NEUTROABS 7.5  --   --   --  9.4* 9.2*  HGB 16.6 15.0 12.1* 11.8* 13.1 12.9*  HCT 52.7* 48.0 38.1* 36.8* 40.0 39.6  MCV 96.7 97.8 96.5 95.1 93.0 93.6  PLT 361 223 194 194 262 161   Basic Metabolic Panel: Recent Labs  Lab 04/22/19 0609 04/23/19 0211 04/24/19 0216 04/25/19 0211 04/26/19 0842 04/27/19 0927  NA 140 138 140 139 142 135  K 4.7 4.5 4.6 4.1 3.1* 2.7*  CL 105 104 106 105 104 103  CO2 22 21* _0 GLUCOSE 110* 164* 143* 117* 126* 227*  BUN 17 30* 25* _1 CREATININE 1.56* 2.51* 1.39* 0.84 0.85 1.00  CALCIUM 8.2* 7.9* 8.1* 8.7* 8.8* 8.3*  MG 1.8 2.1 2.4 2.1 2.0 2.0  PHOS 4.4 3.9 2.0* 2.0*  --  3.6   GFR: Estimated Creatinine Clearance: 146.5 mL/min (by C-G formula based on SCr of 1 mg/dL). Liver Function Tests: Recent Labs  Lab 04/22/19 0609 04/23/19 0211 04/24/19 0216 04/25/19 0211 04/27/19 0927  AST 54* 71* 108* 214* 43*  ALT  95* 68* 62* 86* 64*  ALKPHOS 57 54 55 72 61  BILITOT 0.4 0.7 0.7 1.0 0.4  PROT 6.8 6.7 6.0* 6.8 6.4*  ALBUMIN 4.2 3.5 3.1* 3.2* 2.9*   No results for input(s): LIPASE, AMYLASE in the last 168 hours. No results for input(s): AMMONIA in the last 168 hours. Coagulation Profile: Recent Labs  Lab 04/22/19 0609  INR 0.9   Cardiac Enzymes: Recent Labs  Lab 04/22/19 0609  CKTOTAL 271   BNP (last 3 results) No results for input(s): PROBNP in the last 8760 hours. HbA1C: No results for input(s): HGBA1C in the last 72 hours. CBG: Recent Labs  Lab 04/27/19 0051 04/27/19 0320 04/27/19 0730 04/27/19 1153 04/27/19 1553  GLUCAP 115* 141*  129* 122* 176*   Lipid Profile: No results for input(s): CHOL, HDL, LDLCALC, TRIG, CHOLHDL, LDLDIRECT in the last 72 hours. Thyroid Function Tests: No results for input(s): TSH, T4TOTAL, FREET4, T3FREE, THYROIDAB in the last 72 hours. Anemia Panel: Recent Labs    04/27/19 1052  FERRITIN 180   Sepsis Labs: Recent Labs  Lab 04/27/19 1052  PROCALCITON 0.59    Recent Results (from the past 240 hour(s))  Respiratory Panel by RT PCR (Flu A&B, Covid) - Nasopharyngeal Swab     Status: None   Collection Time: 04/22/19  1:59 AM   Specimen: Nasopharyngeal Swab  Result Value Ref Range Status   SARS Coronavirus 2 by RT PCR NEGATIVE NEGATIVE Final    Comment: (NOTE) SARS-CoV-2 target nucleic acids are NOT DETECTED. The SARS-CoV-2 RNA is generally detectable in upper respiratoy specimens during the acute phase of infection. The lowest concentration of SARS-CoV-2 viral copies this assay can detect is 131 copies/mL. A negative result does not preclude SARS-Cov-2 infection and should not be used as the sole basis for treatment or other patient management decisions. A negative result may occur with  improper specimen collection/handling, submission of specimen other than nasopharyngeal swab, presence of viral mutation(s) within the areas targeted by  this assay, and inadequate number of viral copies (<131 copies/mL). A negative result must be combined with clinical observations, patient history, and epidemiological information. The expected result is Negative. Fact Sheet for Patients:  PinkCheek.be Fact Sheet for Healthcare Providers:  GravelBags.it This test is not yet ap proved or cleared by the [place] FDA and  has been authorized for detection and/or diagnosis of SARS-CoV-2 by FDA under an Emergency Use Authorization (EUA). This EUA will remain  in effect (meaning this test can be used) for the duration of the COVID-19 declaration under Section 564(b)(1) of the Act, 21 U.S.C. section 360bbb-3(b)(1), unless the authorization is terminated or revoked sooner.    Influenza A by PCR NEGATIVE NEGATIVE Final   Influenza B by PCR NEGATIVE NEGATIVE Final    Comment: (NOTE) The Xpert Xpress SARS-CoV-2/FLU/RSV assay is intended as an aid in  the diagnosis of influenza from Nasopharyngeal swab specimens and  should not be used as a sole basis for treatment. Nasal washings and  aspirates are unacceptable for Xpert Xpress SARS-CoV-2/FLU/RSV  testing. Fact Sheet for Patients: PinkCheek.be Fact Sheet for Healthcare Providers: GravelBags.it This test is not yet approved or cleared by the [place] FDA and  has been authorized for detection and/or diagnosis of SARS-CoV-2 by  FDA under an Emergency Use Authorization (EUA). This EUA will remain  in effect (meaning this test can be used) for the duration of the  Covid-19 declaration under Section 564(b)(1) of the Act, 21  U.S.C. section 360bbb-3(b)(1), unless the authorization is  terminated or revoked. Performed at [facility], [place] [address] [place], [place] [address]   Blood culture (routine x 2)     Status: None   Collection Time: 04/22/19   2:29 AM   Specimen: BLOOD  Result Value Ref Range Status   Specimen Description   Final    BLOOD RIGHT HAND Performed at [place] [address], [place], [place] [address]    Special Requests   Final    BOTTLES DRAWN AEROBIC AND ANAEROBIC Blood Culture results may not be optimal due to an inadequate volume of blood received in culture bottles Performed at [place] [address] [place], [place] [address]  Culture   Final    NO GROWTH 5 DAYS Performed at [facility], [place] [address], [place], [place] [address]    Report Status 04/27/2019 FINAL  Final  Blood culture (routine x 2)     Status: None   Collection Time: 04/22/19  2:29 AM   Specimen: BLOOD  Result Value Ref Range Status   Specimen Description   Final    BLOOD RIGHT ANTECUBITAL Performed at [facility], [place] [address], [place], [place] [address]    Special Requests   Final    BOTTLES DRAWN AEROBIC AND ANAEROBIC Blood Culture adequate volume Performed at [place] [address], [place], [place] [address]    Culture   Final    NO GROWTH 5 DAYS Performed at [facility], [place] [address], [place], [place] [address]    Report Status 04/27/2019 FINAL  Final  Culture, respiratory (tracheal aspirate)     Status: None   Collection Time: 04/22/19  4:50 AM   Specimen: Tracheal Aspirate; Respiratory  Result Value Ref Range Status   Specimen Description   Final    TRACHEAL ASPIRATE Performed at [place] [address] [place], [place] [address]    Special Requests   Final    NONE Performed at [facility], [place] [address], [place], [place] [address]    Gram Stain   Final    FEW WBC PRESENT, PREDOMINANTLY PMN NO ORGANISMS SEEN    Culture   Final    Consistent with normal respiratory flora. Performed at [facility], [place] [address], [place], [place] [address]    Report Status  04/24/2019 FINAL  Final  MRSA PCR Screening     Status: None   Collection Time: 04/22/19  4:58 AM   Specimen: Nasopharyngeal  Result Value Ref Range Status   MRSA by PCR NEGATIVE NEGATIVE Final    Comment:        The GeneXpert MRSA Assay (FDA approved for NASAL specimens only), is one component of a comprehensive MRSA colonization surveillance program. It is not intended to diagnose MRSA infection nor to guide or monitor treatment for MRSA infections. Performed at [facility], [place] [address], [place], [place] [address]   Culture, blood (routine x 2)     Status: None   Collection Time: 04/22/19  6:09 AM   Specimen: BLOOD  Result Value Ref Range Status   Specimen Description   Final    BLOOD BLOOD RIGHT HAND Performed at [place] [address], [place], [place] [address]    Special Requests   Final    BOTTLES DRAWN AEROBIC ONLY Blood Culture results may not be optimal due to an inadequate volume of blood received in culture bottles Performed at [place] [address], [place], [place] [address]    Culture   Final    NO GROWTH 5 DAYS Performed at [facility], [place] [address], [place], [place] [address]    Report Status 04/27/2019 FINAL  Final     RN Pressure Injury Documentation:     Estimated body mass index is 33.6 kg/m as calculated from the following:   Height as of this encounter: 6' 2" (1.88 m).   Weight as of this encounter: 118.7 kg.  Malnutrition Type:  Nutrition Problem: Increased nutrient needs Etiology: acute illness   Malnutrition Characteristics:  Signs/Symptoms: estimated needs   Nutrition  Interventions:  Interventions: Prostat, MVI   Radiology Studies: DG CHEST PORT 1 VIEW  Result Date: 04/27/2019 CLINICAL DATA:  33 year old male with recent shortness of breath, pneumonia, COVID-19 status pending. EXAM: PORTABLE CHEST 1 VIEW COMPARISON:  Portable chest 04/25/2019  and earlier. FINDINGS: Portable AP semi upright view at 1142 hours. Continued somewhat low lung volumes with elevated right hemidiaphragm. Mediastinal contours remain normal. Visualized tracheal air column is within normal limits. Regressed bilateral perihilar interstitial and patchy opacity, particularly on the right since 04/25/2019. Residual mostly on the left. No superimposed pneumothorax or pleural effusion. No osseous abnormality identified. IMPRESSION: 1. Partially regressed bilateral pulmonary opacity suspicious for acute viral/atypical respiratory infection. 2. No new cardiopulmonary abnormality. Electronically Signed   By: Genevie Ann M.D.   On: 04/27/2019 11:55   Scheduled Meds: . chlorhexidine gluconate (MEDLINE KIT)  15 mL Mouth Rinse BID  . clonazePAM  1 mg Oral TID  . enoxaparin (LOVENOX) injection  40 mg Subcutaneous Q24H  . feeding supplement (PRO-STAT SUGAR FREE 64)  30 mL Oral QID  . guaiFENesin  600 mg Oral BID  . insulin aspart  0-15 Units Subcutaneous Q4H  . insulin detemir  5 Units Subcutaneous QHS  . ipratropium-albuterol  3 mL Inhalation TID  . multivitamin with minerals  1 tablet Oral Daily  . potassium chloride  40 mEq Oral BID  . sertraline  50 mg Oral Daily  . silver sulfADIAZINE   Topical Daily  . tamsulosin  0.4 mg Oral Daily  . traZODone  50 mg Oral QHS   Continuous Infusions: . sodium chloride    . sodium chloride Stopped (04/23/19 1443)  . ampicillin-sulbactam (UNASYN) IV 3 g (04/27/19 1426)    LOS: 5 days   Kerney Elbe, DO [facility] PAGER is on [place]  If 7PM-7AM, please contact night-coverage [email or web address]
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
## 2019-04-27 NOTE — Evaluation (Signed)
Physical Therapy Evaluation Patient Details Name: Perry Li MRN: [id number] DOB: [birth year] Today's Date: 04/27/2019   History of Present Illness  Pt is 33 year old male with a history of diabetes, anxiety, and high cholesterol who presented after being found after a suspected unintentional drug overdose, received Narcan, and then had an aspiration event leading to acute hypoxia.  Intubated for hypoxic respiratory failure on 04/22/19 and extubated 04/24/19. Did note potassium is 2.7, pt is receiving IV potassium, he was asymptomatic.   Clinical Impression  Pt admitted with above diagnosis. Pt was able to demonstrate safe gait and mobility.  He was able to ambulate without AD.  Pt does have slight decrease in strength and endurance compared to baseline, but expected to progress well with time and basic mobility.  No skilled PT indicated.  Recommend ambulation with nursing while on IV and potassium low, progressing to ambulation with family if not on IV.       Follow Up Recommendations No PT follow up    Equipment Recommendations  None recommended by PT    Recommendations for Other Services       Precautions / Restrictions Precautions Precautions: None      Mobility  Bed Mobility Overal bed mobility: Independent                Transfers Overall transfer level: Needs assistance Equipment used: None Transfers: Sit to/from Stand;Stand Pivot Transfers Sit to Stand: Supervision Stand pivot transfers: Supervision          Ambulation/Gait Ambulation/Gait assistance: Supervision Gait Distance (Feet): 200 Feet Assistive device: None Gait Pattern/deviations: Step-through pattern;WFL(Within Functional Limits)        Stairs            Wheelchair Mobility    Modified Rankin (Stroke Patients Only)       Balance Overall balance assessment: No apparent balance deficits (not formally assessed)                                            Pertinent Vitals/Pain Pain Assessment: No/denies pain    Home Living Family/patient expects to be discharged to:: Private residence Living Arrangements: Parent Available Help at Discharge: Family Type of Home: House Home Access: Level entry     Home Layout: One level Home Equipment: None      Prior Function Level of Independence: Independent               Hand Dominance        Extremity/Trunk Assessment   Upper Extremity Assessment Upper Extremity Assessment: Overall WFL for tasks assessed    Lower Extremity Assessment Lower Extremity Assessment: Overall WFL for tasks assessed       Communication   Communication: No difficulties  Cognition Arousal/Alertness: Awake/alert Behavior During Therapy: WFL for tasks assessed/performed Overall Cognitive Status: Within Functional Limits for tasks assessed                                        General Comments General comments (skin integrity, edema, etc.): On 2 LPM with O2 sats 96%; tried RA and sats 94% rest and 90-92% walking; placed back on O2 in room    Exercises     Assessment/Plan    PT Assessment Patent does not need any further PT services  PT  Problem List         PT Treatment Interventions      PT Goals (Current goals can be found in the Care Plan section)  Acute Rehab PT Goals Patient Stated Goal: return home, get healthier, get back to gym and working out PT Goal Formulation: With patient Time For Goal Achievement: 05/11/19 Potential to Achieve Goals: Good    Frequency     Barriers to discharge        Co-evaluation               AM-PAC PT "6 Clicks" Mobility  Outcome Measure Help needed turning from your back to your side while in a flat bed without using bedrails?: None Help needed moving from lying on your back to sitting on the side of a flat bed without using bedrails?: None Help needed moving to and from a bed to a chair (including a wheelchair)?:  None Help needed standing up from a chair using your arms (e.g., wheelchair or bedside chair)?: None Help needed to walk in hospital room?: None Help needed climbing 3-5 steps with a railing? : None 6 Click Score: 24    End of Session Equipment Utilized During Treatment: Gait belt Activity Tolerance: Patient tolerated treatment well Patient left: in chair;with family/visitor present;with call bell/phone within reach Nurse Communication: Mobility status      Time: 4742-5956 PT Time Calculation (min) (ACUTE ONLY): 14 min   Charges:   PT Evaluation $PT Eval Low Complexity: 1 Low          Maggie Font, PT Acute Rehab Services Pager [phone] [facility] [phone] [facility] [place] 04/27/2019, 1:35 PM

## 2019-04-28 ENCOUNTER — Inpatient Hospital Stay (HOSPITAL_COMMUNITY): Payer: BC Managed Care – PPO

## 2019-04-28 DIAGNOSIS — E8770 Fluid overload, unspecified: Secondary | ICD-10-CM

## 2019-04-28 LAB — GLUCOSE, CAPILLARY
Glucose-Capillary: 129 mg/dL — ABNORMAL HIGH (ref 70–99)
Glucose-Capillary: 116 mg/dL — ABNORMAL HIGH (ref 70–99)
Glucose-Capillary: 125 mg/dL — ABNORMAL HIGH (ref 70–99)
Glucose-Capillary: 114 mg/dL — ABNORMAL HIGH (ref 70–99)
Glucose-Capillary: 120 mg/dL — ABNORMAL HIGH (ref 70–99)

## 2019-04-28 LAB — COMPREHENSIVE METABOLIC PANEL
ALT: 71 U/L — ABNORMAL HIGH (ref 0–44)
AST: 39 U/L (ref 15–41)
Albumin: 3.1 g/dL — ABNORMAL LOW (ref 3.5–5.0)
Alkaline Phosphatase: 58 U/L (ref 38–126)
Anion gap: 11 (ref 5–15)
BUN: 19 mg/dL (ref 6–20)
CO2: 25 mmol/L (ref 22–32)
Calcium: 8.4 mg/dL — ABNORMAL LOW (ref 8.9–10.3)
Chloride: 104 mmol/L (ref 98–111)
Creatinine, Ser: 0.91 mg/dL (ref 0.61–1.24)
GFR calc Af Amer: >60 mL/min (ref >60)
GFR calc non Af Amer: >60 mL/min (ref >60)
Glucose, Bld: 203 mg/dL — ABNORMAL HIGH (ref 70–99)
Potassium: 3.1 mmol/L — ABNORMAL LOW (ref 3.5–5.1)
Sodium: 140 mmol/L (ref 135–145)
Total Bilirubin: 0.5 mg/dL (ref 0.3–1.2)
Total Protein: 6.5 g/dL (ref 6.5–8.1)

## 2019-04-28 LAB — MAGNESIUM: Magnesium: 2 mg/dL (ref 1.7–2.4)

## 2019-04-28 LAB — PROCALCITONIN: Procalcitonin: 0.32 ng/mL

## 2019-04-28 LAB — CBC WITH DIFFERENTIAL/PLATELET
Abs Immature Granulocytes: 0.2 10*3/uL — ABNORMAL HIGH (ref 0.00–0.07)
Basophils Absolute: 0 10*3/uL (ref 0.0–0.1)
Basophils Relative: 0 %
Eosinophils Absolute: 0 10*3/uL (ref 0.0–0.5)
Eosinophils Relative: 0 %
HCT: 40.8 % (ref 39.0–52.0)
Hemoglobin: 13.3 g/dL (ref 13.0–17.0)
Immature Granulocytes: 2 %
Lymphocytes Relative: 17 %
Lymphs Abs: 1.9 10*3/uL (ref 0.7–4.0)
MCH: 30.2 pg (ref 26.0–34.0)
MCHC: 32.6 g/dL (ref 30.0–36.0)
MCV: 92.7 fL (ref 80.0–100.0)
Monocytes Absolute: 0.7 10*3/uL (ref 0.1–1.0)
Monocytes Relative: 6 %
Neutro Abs: 8.3 10*3/uL — ABNORMAL HIGH (ref 1.7–7.7)
Neutrophils Relative %: 75 %
Platelets: 309 10*3/uL (ref 150–400)
RBC: 4.4 MIL/uL (ref 4.22–5.81)
RDW: 11.7 % (ref 11.5–15.5)
WBC: 11.1 10*3/uL — ABNORMAL HIGH (ref 4.0–10.5)
nRBC: 0 % (ref 0.0–0.2)

## 2019-04-28 LAB — PHOSPHORUS: Phosphorus: 3.6 mg/dL (ref 2.5–4.6)

## 2019-04-28 LAB — C-REACTIVE PROTEIN: CRP: 1.9 mg/dL — ABNORMAL HIGH (ref <1.0)

## 2019-04-28 MED ORDER — FUROSEMIDE 10 MG/ML IJ SOLN
40.0000 mg | Freq: Once | INTRAMUSCULAR | Status: AC
  Administered 2019-04-28: 40 mg via INTRAVENOUS
  Filled 2019-04-28: qty 4

## 2019-04-28 MED ORDER — IPRATROPIUM-ALBUTEROL 0.5-2.5 (3) MG/3ML IN SOLN
3.0000 mL | Freq: Two times a day (BID) | RESPIRATORY_TRACT | Status: DC
  Administered 2019-04-28 – 2019-04-29 (×2): 3 mL via RESPIRATORY_TRACT
  Filled 2019-04-28 (×2): qty 3

## 2019-04-28 MED ORDER — POTASSIUM CHLORIDE 10 MEQ/100ML IV SOLN
10.0000 meq | INTRAVENOUS | Status: AC
  Administered 2019-04-28 (×3): 10 meq via INTRAVENOUS
  Filled 2019-04-28 (×3): qty 100

## 2019-04-28 MED ORDER — ONDANSETRON HCL 4 MG/2ML IJ SOLN
4.0000 mg | Freq: Three times a day (TID) | INTRAMUSCULAR | Status: AC | PRN
  Administered 2019-04-28 – 2019-04-29 (×2): 4 mg via INTRAVENOUS
  Filled 2019-04-28 (×2): qty 2

## 2019-04-28 MED ORDER — POTASSIUM CHLORIDE CRYS ER 20 MEQ PO TBCR
40.0000 meq | EXTENDED_RELEASE_TABLET | Freq: Two times a day (BID) | ORAL | Status: AC
  Administered 2019-04-28 (×2): 40 meq via ORAL
  Filled 2019-04-28: qty 2
  Filled 2019-04-28: qty 4

## 2019-04-28 NOTE — Progress Notes (Signed)
PROGRESS NOTE    Perry Li  PIR:[id number] DOB: March 25, 1986 DOA: 04/22/2019 PCP: Darreld Mclean, MD  Brief Narrative:  Patient is a 33 year old obese male with a past medical history significant for but not limited to diabetes mellitus, anxiety, hyperlipidemia who presented to the emergency room after being found after suspected drug overdose and he received Narcan and had an aspiration event leading to acute hypoxia with respiratory failure and he was intubated for airway protection.  He was admitted to ICU for acute hypoxic and hypercarbic respiratory failure due to drug overdose and aspiration pneumonia as patient went into hours and he is extubated 04/24/2019.  He was placed on supplemental oxygen is to be continued and has been ordered nebulizers but has been refusing them frequently.  He has been placed on Unasyn for 7 days and was transferred to the medical floor on 04/25/2019.  In the ICU he required Precedex which has now been stopped.  Psychiatry was consulted for further evaluation and they recommend considering tapering his benzodiazepine as well as opiate medications based on all low-dose potential and possible substance use disorder.  They felt that he was no evidence of imminent risk to self or others at present.  Continues to be short of breath and feels fatigued and weak.  Repeat chest x-ray yesterday showed possible viral process so we will repeat his COVID-19 test was negative and checked inflammatory markers likely this is all secondary to aspiration and they were elevated with an LDH of 216, and CRP of 1.9.  Procalcitonin level is trending but D-dimer 3.14 and fibrinogen 783 were elevated along with ESR of 60.  He was given diuresis and chest x-ray is slowly improving and likely is volume overloaded told him some more diuresis tonight next-we will need to repeat his home amatory screen prior to discharge  Assessment & Plan:   Principal Problem:   Acute respiratory failure  ([place]) Active Problems:   Drug overdose   Acute hypoxic and hypercarbic respiratory failure due to drug overdose and aspiration pneumonia along with ours status post extubation on 04/24/2019 now on supplemental oxygen via nasal cannula with likely concomitant volume overload -Continue supplemental oxygen via nasal cannula and he is on 2 to 3 L of supplemental oxygen and wean as tolerated  -Was not wearing O2 today when I saw him but went back on  -SpO2: 97 % O2 Flow Rate (L/min): 2 L/min FiO2 (%): 30 % -Continue aggressive Pulmicort and continue with Atrovent responder and flutter valve  -Continue with duo nebs, Brovana and budesonide; patient has refused nebulizer treatments in the past; he is now on scheduled DuoNeb 3 mils 3 times daily and on as needed every 6 -Added guaifenesin 6 mg p.o. twice daily -Given IV Lasix this AM with improvement  -He was started on a regular diet -We will continue aspiration precautions -Continue antibiotics with Unasyn for total of 7 days -Chest x-ray this morning showed "Slight interval improvement with decreasing bilateral perihilar airspace opacities. Additionally, there appears to be slightly less vascular congestion." -Repeat COVID Testing Negative  -Continue to Monitor Respiratory Status Carefully -Procalcitonin Level was 0.59 -> 0.32  -Give a dose of IV Lasix 20 mg yesterday and IV 40 this AM and likely will need another 40 mg dose tonight and re-evaluate in AM  -WBC wetnf rom 17.0 -> 11.7 -> 11.1 -Strict I's and O's and Daily Weights; Patient is +4.139 Liters since Admission  -Repeat CXR and CBC in AM  -Will need to  Repeat Home O2 Screen Prior to D/C and ordered one for the AM   Aspiration Pneumonia -Treatment as above any status post extubation -Had a low grade Temp -Rule out Covid again and obtain inflammatory markers as he never had them on admission -Patient LDH was 216, ferritin level is 180, procalcitonin 0.59, 3.14, fibrinogen was 783  -May need a chest CT scan if not improving but he feels better  -Treatment as above -Repeat CXR in AM   Agitation and History of Anxiety -Remains calm and cooperative this morning again -Per report he was agitated the day before yesterday and psychiatry was consulted and he did not meet criteria for involuntary movement or inpatient psychiatric admission at this time and they have signed off -He was restarted on Klonopin at home dose of 1 mg p.o. 3 times daily and will need to wean and taper per psychiatry recommendations -Continue sertraline 50 mg p.o. daily -If he continues to be agitated he may need IM Haldol as needed but he is status post Precedex administration in the ICU  AKI, improved  -Patient's AKI is resolved and he is back to his baseline renal function-had proteinuria predating this admission likely has diabetes mellitus induced renal dysfunction -BUN/creatinine is now 19/0.91 -Avoid nephrotoxic medications, contrast dyes, hypotension and renally adjust medications Avoid NSAIDs X-optimize his diabetes mellitus control -Continue monitor and trend renal function repeat CMP in a.m.  Drug overdose; benzo or trazodone overdose, ethanol and opiates -Likely this was an accidental overdose but he has had what appears to be frequent refill request for his clonazepam -He was on Precedex -Resumed home clonazepam prior to admission dose 1 mg p.o. 3 times daily -Psychiatry was consulted and did not think any further management was needed and recommended weaning benzos slowly -May need outpatient psychiatric follow-up at discharge  Diabetes Mellitus Type 2 -Blood glucose was much better controlled -He has a history of DKA not on insulin at home -Home Metformin is currently being held Hemoglobin A1c was 6.1 -Continue long-acting Levemir sliding scale insulin as needed Continue with diabetes diet -CBG's ranging from 114-154  Hypokalemia  -Patient's K+ was 3.1 -Patient with p.o.  potassium chloride 40 mEQ twice daily as well as IV 40 mEq KCl -Continue to Monitor and Replete as Necessary -Repeat CMP in AM   Abnormal LFT's, improving  -Patient's AST went from 214 -> 43 -> 39 -Patient's ALT went from 86 -> 64 -> 71 -Has no complaints at this time and likely needs outpatient evaluation concerning for NASH -Repeat and trend CMP in a.m. and if worsens will obtain a right upper quadrant ultrasound and acute hepatitis panel  Obesity -Estimated body mass index is 32.61 kg/m as calculated from the following:   Height as of this encounter: '6\' 2"'  (1.88 m).   Weight as of this encounter: 115.2 kg., -Weight Loss and Dietary Counseling given   DVT prophylaxis: Enoxaparin 40 mg sq q24h Code Status: FULL CODE  Family Communication: Discussed with Mother at bedside  Disposition Plan: Pending further improvement in Oxygen Status and when he is Euvolemic again   Consultants:   PCCM Transfer  Procedures:  Intubation 12/20   Antimicrobials:  Anti-infectives (From admission, onward)   Start     Dose/Rate Route Frequency Ordered Stop   04/22/19 0900  Ampicillin-Sulbactam (UNASYN) 3 g in sodium chloride 0.9 % 100 mL IVPB     3 g 200 mL/hr over 30 Minutes Intravenous Every 6 hours 04/22/19 0450     04/22/19  0200  Ampicillin-Sulbactam (UNASYN) 3 g in sodium chloride 0.9 % 100 mL IVPB     3 g 200 mL/hr over 30 Minutes Intravenous  Once 04/22/19 0159 04/22/19 0310     Subjective: Patient examined at bedside and he feels a little bit better breathing wise but states that he vomited last night and had a rough night and states that he did not require oxygen yesterday but had to go back on it last night.  No chest pain, lightheadedness or dizziness.  Was ambulating the halls and still feels a little bit short of breath.  No other concerns or plans at this time and feels that the Lasix given to him has definitely helped his fluid status as well as his breathing.  Objective:  Vitals:   04/28/19 0603 04/28/19 0807 04/28/19 0810 04/28/19 1258  BP: (!) 149/93   (!) 160/80  Pulse: (!) 59   (!) 58  Resp: (!) 24   16  Temp: 98.6 F (37 C)   98.6 F (37 C)  TempSrc: Oral   Oral  SpO2: 92% 93% 93% 97%  Weight:      Height:        Intake/Output Summary (Last 24 hours) at 04/28/2019 1650 Last data filed at 04/28/2019 1601 Gross per 24 hour  Intake 573.82 ml  Output -  Net 573.82 ml   Filed Weights   04/24/19 0446 04/25/19 0500 04/28/19 0557  Weight: 119.2 kg 118.7 kg 115.2 kg   Examination: Physical Exam:  Constitutional: WN/WD OBESE MALE IN NO ACUTE DISTRESS APPEARS CALM AND IS A BIT MORE COMFORTABLE TODAY SITTING IN THE CHAIR BEDSIDE  Eyes: Lids and conjunctivae normal, sclerae anicteric  ENMT: External Ears, Nose appear normal. Grossly normal hearing. Mucous membranes are moist. Has facial wound from where tape was from ETT Neck: Appears normal, supple, no cervical masses, normal ROM, no appreciable thyromegaly; no JVD Respiratory: Diminished to auscultation bilaterally, no wheezing, rales, rhonchi or crackles. Normal respiratory effort and patient is not tachypenic. No accessory muscle use. Unlabored breathing and was not wearing Supplemental O2 when I saw him Cardiovascular: RRR, no murmurs / rubs / gallops. S1 and S2 auscultated. 1+ extremity edema.  Abdomen: Soft, non-tender,Distended due to body habitus. Bowel sounds positive x4.  GU: Deferred. Musculoskeletal: No clubbing / cyanosis of digits/nails. No joint deformity upper and lower extremities.  Skin: No rashes, lesions, ulcers on a limited skin evaluation. No induration; Warm and dry.  Neurologic: CN 2-12 grossly intact with no focal deficits. Romberg sign and cerebellar reflexes not assessed.  Psychiatric: Normal judgment and insight. Alert and oriented x 3. Normal mood and appropriate affect.   Data Reviewed: I have personally reviewed following labs and imaging studies  CBC: Recent Labs   Lab 04/22/19 0229 04/24/19 0216 04/25/19 0211 04/26/19 0842 04/27/19 0927 04/28/19 0928  WBC 10.2 13.3* 12.3* 11.6* 11.7* 11.1*  NEUTROABS 7.5  --   --  9.4* 9.2* 8.3*  HGB 16.6 12.1* 11.8* 13.1 12.9* 13.3  HCT 52.7* 38.1* 36.8* 40.0 39.6 40.8  MCV 96.7 96.5 95.1 93.0 93.6 92.7  PLT 361 194 194 262 290 969   Basic Metabolic Panel: Recent Labs  Lab 04/23/19 0211 04/24/19 0216 04/25/19 0211 04/26/19 0842 04/27/19 0927 04/28/19 0928  NA 138 140 139 142 135 140  K 4.5 4.6 4.1 3.1* 2.7* 3.1*  CL 104 106 105 104 103 104  CO2 21* '26 26 23 23 25  ' GLUCOSE 164* 143* 117* 126* 227* 203*  BUN 30* 25* '16 19 19 19  ' CREATININE 2.51* 1.39* 0.84 0.85 1.00 0.91  CALCIUM 7.9* 8.1* 8.7* 8.8* 8.3* 8.4*  MG 2.1 2.4 2.1 2.0 2.0 2.0  PHOS 3.9 2.0* 2.0*  --  3.6 3.6   GFR: Estimated Creatinine Clearance: 158.7 mL/min (by C-G formula based on SCr of 0.91 mg/dL). Liver Function Tests: Recent Labs  Lab 04/23/19 0211 04/24/19 0216 04/25/19 0211 04/27/19 0927 04/28/19 0928  AST 71* 108* 214* 43* 39  ALT 68* 62* 86* 64* 71*  ALKPHOS 54 55 72 61 58  BILITOT 0.7 0.7 1.0 0.4 0.5  PROT 6.7 6.0* 6.8 6.4* 6.5  ALBUMIN 3.5 3.1* 3.2* 2.9* 3.1*   No results for input(s): LIPASE, AMYLASE in the last 168 hours. No results for input(s): AMMONIA in the last 168 hours. Coagulation Profile: Recent Labs  Lab 04/22/19 0609  INR 0.9   Cardiac Enzymes: Recent Labs  Lab 04/22/19 0609  CKTOTAL 271   BNP (last 3 results) No results for input(s): PROBNP in the last 8760 hours. HbA1C: No results for input(s): HGBA1C in the last 72 hours. CBG: Recent Labs  Lab 04/27/19 2354 04/28/19 0409 04/28/19 0801 04/28/19 1200 04/28/19 1557  GLUCAP 101* 129* 116* 125* 114*   Lipid Profile: No results for input(s): CHOL, HDL, LDLCALC, TRIG, CHOLHDL, LDLDIRECT in the last 72 hours. Thyroid Function Tests: No results for input(s): TSH, T4TOTAL, FREET4, T3FREE, THYROIDAB in the last 72 hours. Anemia  Panel: Recent Labs    04/27/19 1052  FERRITIN 180   Sepsis Labs: Recent Labs  Lab 04/27/19 1052 04/28/19 0455  PROCALCITON 0.59 0.32    Recent Results (from the past 240 hour(s))  Respiratory Panel by RT PCR (Flu A&B, Covid) - Nasopharyngeal Swab     Status: None   Collection Time: 04/22/19  1:59 AM   Specimen: Nasopharyngeal Swab  Result Value Ref Range Status   SARS Coronavirus 2 by RT PCR NEGATIVE NEGATIVE Final    Comment: (NOTE) SARS-CoV-2 target nucleic acids are NOT DETECTED. The SARS-CoV-2 RNA is generally detectable in upper respiratoy specimens during the acute phase of infection. The lowest concentration of SARS-CoV-2 viral copies this assay can detect is 131 copies/mL. A negative result does not preclude SARS-Cov-2 infection and should not be used as the sole basis for treatment or other patient management decisions. A negative result may occur with  improper specimen collection/handling, submission of specimen other than nasopharyngeal swab, presence of viral mutation(s) within the areas targeted by this assay, and inadequate number of viral copies (<131 copies/mL). A negative result must be combined with clinical observations, patient history, and epidemiological information. The expected result is Negative. Fact Sheet for Patients:  PinkCheek.be Fact Sheet for Healthcare Providers:  GravelBags.it This test is not yet ap proved or cleared by the [place] FDA and  has been authorized for detection and/or diagnosis of SARS-CoV-2 by FDA under an Emergency Use Authorization (EUA). This EUA will remain  in effect (meaning this test can be used) for the duration of the COVID-19 declaration under Section 564(b)(1) of the Act, 21 U.S.C. section 360bbb-3(b)(1), unless the authorization is terminated or revoked sooner.    Influenza A by PCR NEGATIVE NEGATIVE Final   Influenza B by PCR NEGATIVE NEGATIVE  Final    Comment: (NOTE) The Xpert Xpress SARS-CoV-2/FLU/RSV assay is intended as an aid in  the diagnosis of influenza from Nasopharyngeal swab specimens and  should not be used as a sole basis for treatment. Nasal washings  and  aspirates are unacceptable for Xpert Xpress SARS-CoV-2/FLU/RSV  testing. Fact Sheet for Patients: PinkCheek.be Fact Sheet for Healthcare Providers: GravelBags.it This test is not yet approved or cleared by the [place] FDA and  has been authorized for detection and/or diagnosis of SARS-CoV-2 by  FDA under an Emergency Use Authorization (EUA). This EUA will remain  in effect (meaning this test can be used) for the duration of the  Covid-19 declaration under Section 564(b)(1) of the Act, 21  U.S.C. section 360bbb-3(b)(1), unless the authorization is  terminated or revoked. Performed at [facility], [place] [address] [place], [place] [address]   Blood culture (routine x 2)     Status: None   Collection Time: 04/22/19  2:29 AM   Specimen: BLOOD  Result Value Ref Range Status   Specimen Description   Final    BLOOD RIGHT HAND Performed at [place] [address] [place], [place] [address]    Special Requests   Final    BOTTLES DRAWN AEROBIC AND ANAEROBIC Blood Culture results may not be optimal due to an inadequate volume of blood received in culture bottles Performed at [place] [address] [place], [place] [address]    Culture   Final    NO GROWTH 5 DAYS Performed at [facility], [place] [address], [place], [place] [address]    Report Status 04/27/2019 FINAL  Final  Blood culture (routine x 2)     Status: None   Collection Time: 04/22/19  2:29 AM   Specimen: BLOOD  Result Value Ref Range Status   Specimen Description   Final    BLOOD RIGHT ANTECUBITAL Performed at [facility], [place] [address]  [place], [place] [address]    Special Requests   Final    BOTTLES DRAWN AEROBIC AND ANAEROBIC Blood Culture adequate volume Performed at [place] [address], [place], [place] [address]    Culture   Final    NO GROWTH 5 DAYS Performed at [facility], [place] [address] [place], [place] [address]    Report Status 04/27/2019 FINAL  Final  Culture, respiratory (tracheal aspirate)     Status: None   Collection Time: 04/22/19  4:50 AM   Specimen: Tracheal Aspirate; Respiratory  Result Value Ref Range Status   Specimen Description   Final    TRACHEAL ASPIRATE Performed at [place] [address], [place], [place] [address]    Special Requests   Final    NONE Performed at [facility], [place] [address] [place], [place] [address]    Gram Stain   Final    FEW WBC PRESENT, PREDOMINANTLY PMN NO ORGANISMS SEEN    Culture   Final    Consistent with normal respiratory flora. Performed at [facility], [place] [address], [place], [place] [address]    Report Status 04/24/2019 FINAL  Final  MRSA PCR Screening     Status: None   Collection Time: 04/22/19  4:58 AM   Specimen: Nasopharyngeal  Result Value Ref Range Status   MRSA by PCR NEGATIVE NEGATIVE Final    Comment:        The GeneXpert MRSA Assay (FDA approved for NASAL specimens only), is one component of a comprehensive MRSA colonization surveillance program. It is not intended to diagnose MRSA infection nor to guide or monitor treatment for MRSA infections. Performed at [facility], [place] [address] [place], [place] [address]   Culture, blood (routine x 2)     Status: None   Collection Time: 04/22/19  6:09 AM   Specimen: BLOOD  Result Value Ref Range Status   Specimen Description   Final    BLOOD BLOOD RIGHT HAND Performed at [place] [address] [place], [place] [address]    Special Requests   Final     BOTTLES DRAWN AEROBIC ONLY Blood Culture results may not be optimal due to an inadequate volume of blood received in culture bottles Performed at [place] [address] [place], [place] [address]    Culture   Final    NO GROWTH 5 DAYS Performed at [facility], [place] [address] [place], [place] [address]    Report Status 04/27/2019 FINAL  Final  SARS CORONAVIRUS 2 (TAT 6-24 HRS) Nasopharyngeal Nasopharyngeal Swab     Status: None   Collection Time: 04/27/19  2:30 PM   Specimen: Nasopharyngeal Swab  Result Value Ref Range Status   SARS Coronavirus 2 NEGATIVE NEGATIVE Final    Comment: (NOTE) SARS-CoV-2 target nucleic acids are NOT DETECTED. The SARS-CoV-2 RNA is generally detectable in upper and lower respiratory specimens during the acute phase of infection. Negative results do not preclude SARS-CoV-2 infection, do not rule out co-infections with other pathogens, and should not be used as the sole basis for treatment or other patient management decisions. Negative results must be combined with clinical observations, patient history, and epidemiological information. The expected result is Negative. Fact Sheet for Patients: SugarRoll.be Fact Sheet for Healthcare Providers: https://www.woods-mathews.com/ This test is not yet approved or cleared by the [place] FDA and  has been authorized for detection and/or diagnosis of SARS-CoV-2 by FDA under an Emergency Use Authorization (EUA). This EUA will remain  in effect (meaning this test can be used) for the duration of the COVID-19 declaration under Section 56 4(b)(1) of the Act, 21 U.S.C. section 360bbb-3(b)(1), unless the authorization is terminated or revoked sooner. Performed at [facility], [place] [address] [place], [place] [address]      RN Pressure Injury Documentation:     Estimated body mass index is 32.61 kg/m as calculated from the  following:   Height as of this encounter: '6\' 2"'  (1.88 m).   Weight as of this encounter: 115.2 kg.  Malnutrition Type:  Nutrition Problem: Increased nutrient needs Etiology: acute illness   Malnutrition Characteristics:  Signs/Symptoms: estimated needs   Nutrition Interventions:  Interventions: Prostat, MVI   Radiology Studies: DG CHEST PORT 1 VIEW  Result Date: 04/28/2019 CLINICAL DATA:  33 year old male with progressive shortness of breath and heart palpitations EXAM: PORTABLE CHEST 1 VIEW COMPARISON:  Prior chest x-ray 04/27/2019 FINDINGS: Inspiratory volumes remain relatively low with elevation of the right hemidiaphragm. Improving left perihilar patchy airspace opacities. There may be a slight interval decrease in mild vascular congestion. No pleural effusion or pneumothorax. No acute osseous abnormality. IMPRESSION: 1. Slight interval improvement with decreasing bilateral perihilar airspace opacities. Additionally, there appears to be slightly less vascular congestion. Electronically Signed   By: Jacqulynn Cadet M.D.   On: 04/28/2019 09:58   DG CHEST PORT 1 VIEW  Result Date: 04/27/2019 CLINICAL DATA:  33 year old male with recent shortness of breath, pneumonia, COVID-19 status pending. EXAM: PORTABLE CHEST 1 VIEW COMPARISON:  Portable chest 04/25/2019 and earlier. FINDINGS: Portable AP semi upright view at 1142 hours. Continued somewhat low lung volumes  with elevated right hemidiaphragm. Mediastinal contours remain normal. Visualized tracheal air column is within normal limits. Regressed bilateral perihilar interstitial and patchy opacity, particularly on the right since 04/25/2019. Residual mostly on the left. No superimposed pneumothorax or pleural effusion. No osseous abnormality identified. IMPRESSION: 1. Partially regressed bilateral pulmonary opacity suspicious for acute viral/atypical respiratory infection. 2. No new cardiopulmonary abnormality. Electronically Signed   By:  Genevie Ann M.D.   On: 04/27/2019 11:55   Scheduled Meds: . chlorhexidine gluconate (MEDLINE KIT)  15 mL Mouth Rinse BID  . clonazePAM  1 mg Oral TID  . enoxaparin (LOVENOX) injection  40 mg Subcutaneous Q24H  . feeding supplement (PRO-STAT SUGAR FREE 64)  30 mL Oral QID  . guaiFENesin  600 mg Oral BID  . insulin aspart  0-15 Units Subcutaneous Q4H  . insulin detemir  5 Units Subcutaneous QHS  . ipratropium-albuterol  3 mL Inhalation BID  . multivitamin with minerals  1 tablet Oral Daily  . potassium chloride  40 mEq Oral BID  . sertraline  50 mg Oral Daily  . silver sulfADIAZINE   Topical Daily  . tamsulosin  0.4 mg Oral Daily  . traZODone  50 mg Oral QHS   Continuous Infusions: . sodium chloride    . sodium chloride Stopped (04/23/19 4656)  . ampicillin-sulbactam (UNASYN) IV 3 g (04/28/19 1437)    LOS: 6 days   Kerney Elbe, DO [facility] PAGER is on [place]  If 7PM-7AM, please contact night-coverage [email or web address]

## 2019-04-28 NOTE — Evaluation (Signed)
Occupational Therapy Evaluation Patient Details Name: Tatum Massman MRN: [id number] DOB: [birth year] Today's Date: 04/28/2019    History of Present Illness Pt is 33 year old male with a history of diabetes, anxiety, and high cholesterol who presented after being found after a suspected unintentional drug overdose, received Narcan, and then had an aspiration event leading to acute hypoxia.  Intubated for hypoxic respiratory failure on 04/22/19 and extubated 04/24/19.   Clinical Impression   OT eval and education complete.  Pt overall I but does move slowly.  No further OT needed             Precautions / Restrictions Precautions Precautions: None      Mobility Bed Mobility Overal bed mobility: Independent                Transfers Overall transfer level: Independent                    Balance Overall balance assessment: No apparent balance deficits (not formally assessed)                                         ADL either performed or assessed with clinical judgement   ADL Overall ADL's : Independent                                       General ADL Comments: pt moves slowly but is overall I with ADL activity     Vision Baseline Vision/History: No visual deficits              Pertinent Vitals/Pain Pain Assessment: No/denies pain     Hand Dominance     Extremity/Trunk Assessment Upper Extremity Assessment Upper Extremity Assessment: Overall WFL for tasks assessed           Communication Communication Communication: No difficulties   Cognition Arousal/Alertness: Awake/alert Behavior During Therapy: WFL for tasks assessed/performed Overall Cognitive Status: Within Functional Limits for tasks assessed                                                Home Living Family/patient expects to be discharged to:: Private residence Living Arrangements: Parent Available Help at Discharge:  Family Type of Home: House Home Access: Level entry     Home Layout: One level     Bathroom Shower/Tub: Dietitian: None          Prior Functioning/Environment Level of Independence: Independent                          OT Goals(Current goals can be found in the care plan section) Acute Rehab OT Goals Patient Stated Goal: return home, get healthier, get back to gym and working out  OT Frequency:      End of Session    Activity Tolerance:  good Patient left:  in chair with call bell                   Time: 1049-1101 OT Time Calculation (min): 12 min Charges:  OT General Charges $OT Visit: 1 Visit OT Evaluation $  OT Eval Low Complexity: 1 Low  Kari Baars, OT Acute Rehabilitation Services [phone] Office- [phone], Edwena Felty D 04/28/2019, 11:07 AM

## 2019-04-29 LAB — GLUCOSE, CAPILLARY
Glucose-Capillary: 106 mg/dL — ABNORMAL HIGH (ref 70–99)
Glucose-Capillary: 110 mg/dL — ABNORMAL HIGH (ref 70–99)
Glucose-Capillary: 116 mg/dL — ABNORMAL HIGH (ref 70–99)
Glucose-Capillary: 121 mg/dL — ABNORMAL HIGH (ref 70–99)
Glucose-Capillary: 132 mg/dL — ABNORMAL HIGH (ref 70–99)

## 2019-04-29 LAB — COMPREHENSIVE METABOLIC PANEL
ALT: 89 U/L — ABNORMAL HIGH (ref 0–44)
AST: 47 U/L — ABNORMAL HIGH (ref 15–41)
Albumin: 3.4 g/dL — ABNORMAL LOW (ref 3.5–5.0)
Alkaline Phosphatase: 63 U/L (ref 38–126)
Anion gap: 17 — ABNORMAL HIGH (ref 5–15)
BUN: 17 mg/dL (ref 6–20)
CO2: 24 mmol/L (ref 22–32)
Calcium: 8.2 mg/dL — ABNORMAL LOW (ref 8.9–10.3)
Chloride: 97 mmol/L — ABNORMAL LOW (ref 98–111)
Creatinine, Ser: 0.77 mg/dL (ref 0.61–1.24)
GFR calc Af Amer: >60 mL/min (ref >60)
GFR calc non Af Amer: >60 mL/min (ref >60)
Glucose, Bld: 133 mg/dL — ABNORMAL HIGH (ref 70–99)
Potassium: 3.7 mmol/L (ref 3.5–5.1)
Sodium: 138 mmol/L (ref 135–145)
Total Bilirubin: 0.9 mg/dL (ref 0.3–1.2)
Total Protein: 7.2 g/dL (ref 6.5–8.1)

## 2019-04-29 LAB — CBC WITH DIFFERENTIAL/PLATELET
Abs Immature Granulocytes: 0.2 10*3/uL — ABNORMAL HIGH (ref 0.00–0.07)
Basophils Absolute: 0.1 10*3/uL (ref 0.0–0.1)
Basophils Relative: 1 %
Eosinophils Absolute: 0.2 10*3/uL (ref 0.0–0.5)
Eosinophils Relative: 1 %
HCT: 43.5 % (ref 39.0–52.0)
Hemoglobin: 14.1 g/dL (ref 13.0–17.0)
Immature Granulocytes: 2 %
Lymphocytes Relative: 21 %
Lymphs Abs: 2.4 10*3/uL (ref 0.7–4.0)
MCH: 30.1 pg (ref 26.0–34.0)
MCHC: 32.4 g/dL (ref 30.0–36.0)
MCV: 92.9 fL (ref 80.0–100.0)
Monocytes Absolute: 1 10*3/uL (ref 0.1–1.0)
Monocytes Relative: 8 %
Neutro Abs: 7.8 10*3/uL — ABNORMAL HIGH (ref 1.7–7.7)
Neutrophils Relative %: 67 %
Platelets: 350 10*3/uL (ref 150–400)
RBC: 4.68 MIL/uL (ref 4.22–5.81)
RDW: 11.7 % (ref 11.5–15.5)
WBC: 11.6 10*3/uL — ABNORMAL HIGH (ref 4.0–10.5)
nRBC: 0 % (ref 0.0–0.2)

## 2019-04-29 LAB — PROCALCITONIN: Procalcitonin: 0.2 ng/mL

## 2019-04-29 LAB — PHOSPHORUS: Phosphorus: 3.7 mg/dL (ref 2.5–4.6)

## 2019-04-29 LAB — MAGNESIUM: Magnesium: 2.2 mg/dL (ref 1.7–2.4)

## 2019-04-29 LAB — C-REACTIVE PROTEIN: CRP: 1.2 mg/dL — ABNORMAL HIGH (ref <1.0)

## 2019-04-29 MED ORDER — FUROSEMIDE 10 MG/ML IJ SOLN
40.0000 mg | Freq: Once | INTRAMUSCULAR | Status: AC
  Administered 2019-04-29: 40 mg via INTRAVENOUS
  Filled 2019-04-29: qty 4

## 2019-04-29 MED ORDER — SERTRALINE HCL 50 MG PO TABS: 50.0000 mg | ORAL_TABLET | Freq: Every day | ORAL | 2 refills | Status: DC

## 2019-04-29 MED ORDER — POTASSIUM CHLORIDE CRYS ER 20 MEQ PO TBCR
40.0000 meq | EXTENDED_RELEASE_TABLET | Freq: Two times a day (BID) | ORAL | Status: DC
  Administered 2019-04-29: 40 meq via ORAL
  Filled 2019-04-29: qty 2

## 2019-04-29 MED ORDER — TRAZODONE HCL 50 MG PO TABS: 50.0000 mg | ORAL_TABLET | Freq: Every day | ORAL | 3 refills | Status: DC

## 2019-04-29 NOTE — Progress Notes (Signed)
SATURATION QUALIFICATIONS: (This note is used to comply with regulatory documentation for home oxygen)  Patient Saturations on Room Air at Rest = 95%  Patient Saturations on Room Air while Ambulating = 91-98%  Patient Saturations on 0 Liters of oxygen while Ambulating = 91-98%  Please briefly explain why patient needs home oxygen:

## 2019-04-29 NOTE — Discharge Summary (Signed)
Physician Discharge Summary  Perry Li ZOX:[id number] DOB: 1986/01/23 DOA: 04/22/2019  PCP: Perry Mclean, MD  Admit date: 04/22/2019 Discharge date: 04/29/2019  Admitted From: Home  Disposition:  Home   Recommendations for Outpatient Follow-up:  1. Follow up with PCP Perry Li in 1 week 2. Perry Li: Please evaluate for residual fluid overload, obtain BMP to evaluate Cr, K   3. Perry Li: Please taper clonazepam and refer to behavioral health      Home Health: None  Equipment/Devices: None  Discharge Condition: Fair  CODE STATUS: FULL Diet recommendation: Diabetic  Brief/Interim Summary: Perry Li is a 33 y.o. M with anxiety, DM, and obesity who presented due to being found unresponsive and cyanotic by family.    EMS gave Narcan, after which patient awoke and vomited and became combative.  In the ER, patient required non-rebreather, then became more hypoxic and was intubated.  Per report, snorted trazodone, and had alcohol in serum.  Also prescribed clonazepam TID.        PRINCIPAL HOSPITAL DIAGNOSIS: Clonazepam, trazodone, alcohol, and opiate overdose, complicated by aspiration pneumonia    Discharge Diagnoses:   Benzodiazepine, trazodone, alcohol, and opiate overdose Drug withdrawal delirium Was admitted and intubated.  Found to have acute hypoxic and hypercarbic respiratory failure due to drug overdose, requiring intubation.  Extubated after 48 hours.  After extubation, the patient did require Precedex for 24 hours.  Subsequently evaluated by psychiatry who recommended benzodiazepine taper and started sertraline.  As the patient presented no imminent risk to self or others, psychiatry recommended outpatient psychiatry follow-up.    Aspiration pneumonia Patient aspirated with EMS, and then again in the ER.  Chest x-ray showed new opacities, and so the patient was started on empiric antibiotics.  He was treated with 7 days Unasyn, and had no  further fever.  Acute diastolic CHF Patient with no previous history of congestive heart failure and normal ejection fraction.  After transfer out of the intensive care unit, the patient remained hypoxic, orthopnea, and with dyspnea on exertion.  He was treated with IV Lasix and his hypoxia resolved.    On the day of discharge, the patient was able to ambulate without hypoxia.  He should follow-up closely with his PCP to determine if further diuresis will be warranted.  Diabetes Glucoses well controlled in the hospital.           Discharge Instructions  Discharge Instructions    Discharge instructions   Complete by: As directed    From Perry Li: You were admitted for an overdose. As a result of the overdose, you developed an "aspiration pneumonia" meaning, some stomach contents refluxed up to your lungs and caused a pneumonia.   You were treated with antibiotics for this and it is resolved.  Taking opiates (like morphine, oxycodone, Percocet, etc) with benzodiazepines (like Klonopin) is known to cause a very high rate of overdoses.   You should reduce your clonazepam/Klonopin use because of this.   Klonopin magnifies the respiratory suppression (slowing breathing) effects of opiates, alcohol, and trazodone, and is very unsafe.  Start the new medicine for depression/anxiety called sertraline Take sertraline 50 mg daily  Seek counseling for your mood disorder and also for substance use disorder.  Call your primary care doctor for a follow up appointment in  1 week or as soon as able.  Resume your home diabetes medicines.   Increase activity slowly   Complete by: As directed      Allergies as of  04/29/2019   No Known Allergies     Medication List    TAKE these medications   blood glucose meter kit and supplies Dispense based on patient and insurance preference. Use up to twice daily as directed. (FOR ICD-10 E10.9, E11.9).   blood glucose meter kit and supplies  Kit Dispense based on patient and insurance preference. Use up to four times daily as directed. (FOR ICD-9 250.00, 250.01).   clonazePAM 1 MG tablet Commonly known as: KLONOPIN TAKE 1 TABLET(1 MG) BY MOUTH Three times DAILY. What changed:   how much to take  how to take this  when to take this  additional instructions   metFORMIN 500 MG 24 hr tablet Commonly known as: Glucophage XR Take 1 tablet (500 mg total) by mouth daily with breakfast.   sertraline 50 MG tablet Commonly known as: ZOLOFT Take 1 tablet (50 mg total) by mouth daily. Start taking on: April 30, 2019   traZODone 50 MG tablet Commonly known as: DESYREL Take 1 tablet (50 mg total) by mouth at bedtime. What changed:   how much to take  when to take this  reasons to take this  Another medication with the same name was removed. Continue taking this medication, and follow the directions you see here.      Follow-up Information    Copland, Gay Filler, MD. Schedule an appointment as soon as possible for a visit in 1 week(s).   Specialty: Family Medicine Contact information: [place] [address] [phone]          No Known Allergies  Consultations:  Psychiatry  Critical Care   Procedures/Studies: DG Abdomen 1 View  Result Date: 04/22/2019 CLINICAL DATA:  Orogastric tube placement. EXAM: ABDOMEN - 1 VIEW COMPARISON:  None. FINDINGS: Tip and side port of the enteric tube below the diaphragm in the stomach. No bowel dilatation in the upper abdomen. IMPRESSION: Tip and side port of the enteric tube below the diaphragm in the stomach. Electronically Signed   By: Keith Rake M.D.   On: 04/22/2019 02:15   DG CHEST PORT 1 VIEW  Result Date: 04/28/2019 CLINICAL DATA:  33 year old male with shortness of breath on exertion. EXAM: PORTABLE CHEST 1 VIEW COMPARISON:  Earlier radiograph dated 04/28/2019 and multiple prior radiographs dating back to 04/22/2019.  FINDINGS: Minimal interval improvement in the bilateral perihilar streaky densities since the earlier radiograph of 04/28/2019. Overall significant interval improvement in bilateral airspace densities since the radiograph of 04/22/2019. There is no pleural effusion or pneumothorax. Stable cardiac silhouette. No acute osseous pathology. IMPRESSION: Minimal interval improvement in the bilateral perihilar streaky densities since the earlier radiograph of 12/26/202020. Electronically Signed   By: Anner Crete M.D.   On: 04/28/2019 17:22   DG CHEST PORT 1 VIEW  Result Date: 04/28/2019 CLINICAL DATA:  33 year old male with progressive shortness of breath and heart palpitations EXAM: PORTABLE CHEST 1 VIEW COMPARISON:  Prior chest x-ray 04/27/2019 FINDINGS: Inspiratory volumes remain relatively low with elevation of the right hemidiaphragm. Improving left perihilar patchy airspace opacities. There may be a slight interval decrease in mild vascular congestion. No pleural effusion or pneumothorax. No acute osseous abnormality. IMPRESSION: 1. Slight interval improvement with decreasing bilateral perihilar airspace opacities. Additionally, there appears to be slightly less vascular congestion. Electronically Signed   By: Jacqulynn Cadet M.D.   On: 04/28/2019 09:58   DG CHEST PORT 1 VIEW  Result Date: 04/27/2019 CLINICAL DATA:  33 year old male with recent  shortness of breath, pneumonia, COVID-19 status pending. EXAM: PORTABLE CHEST 1 VIEW COMPARISON:  Portable chest 04/25/2019 and earlier. FINDINGS: Portable AP semi upright view at 1142 hours. Continued somewhat low lung volumes with elevated right hemidiaphragm. Mediastinal contours remain normal. Visualized tracheal air column is within normal limits. Regressed bilateral perihilar interstitial and patchy opacity, particularly on the right since 04/25/2019. Residual mostly on the left. No superimposed pneumothorax or pleural effusion. No osseous abnormality  identified. IMPRESSION: 1. Partially regressed bilateral pulmonary opacity suspicious for acute viral/atypical respiratory infection. 2. No new cardiopulmonary abnormality. Electronically Signed   By: Genevie Ann M.D.   On: 04/27/2019 11:55   DG Chest Port 1 View  Result Date: 04/25/2019 CLINICAL DATA:  Follow-up pneumonia. EXAM: PORTABLE CHEST 1 VIEW COMPARISON:  Chest x-rays dated 04/24/2019 and 04/23/2019. FINDINGS: Endotracheal tube and enteric tube have been removed. Heart size and mediastinal contours are grossly stable. Patchy bilateral perihilar airspace opacities are stable. No pleural effusion or pneumothorax is seen. Osseous structures about the chest are unremarkable. Elevation of the RIGHT hemidiaphragm IMPRESSION: 1. Interval removal of endotracheal tube and enteric tube. 2. Patchy bilateral perihilar airspace opacities are stable, compatible with given history of pneumonia, perhaps with superimposed central pulmonary vascular congestion. 3. Elevation of the RIGHT hemidiaphragm, likely accentuated by patient positioning. Electronically Signed   By: Franki Cabot M.D.   On: 04/25/2019 08:53   DG Chest Port 1 View  Result Date: 04/24/2019 CLINICAL DATA:  Respiratory failure. EXAM: PORTABLE CHEST 1 VIEW COMPARISON:  04/23/2019 FINDINGS: The endotracheal tube is 4 cm above the carina. The NG tube is coursing down the esophagus and into the stomach. Persistent but slightly improved perihilar predominant interstitial and airspace process, likely pulmonary edema. No focal infiltrates or effusions. IMPRESSION: 1. Stable support apparatus. 2. Persistent but slightly improved perihilar predominant interstitial and airspace process. Electronically Signed   By: Marijo Sanes M.D.   On: 04/24/2019 06:04   DG CHEST PORT 1 VIEW  Result Date: 04/23/2019 CLINICAL DATA:  33 year old male status post intubation. EXAM: PORTABLE CHEST 1 VIEW COMPARISON:  Chest radiograph dated 04/22/2019. FINDINGS: Endotracheal  tube with tip approximately 3.8 cm above the carina. An enteric tube extends below the diaphragm with tip beyond the inferior margin of the image. Bilateral confluent airspace opacities, left greater right with slight interval improvement since the prior radiograph. No pleural effusion or pneumothorax. Stable cardiac silhouette. No acute osseous pathology. IMPRESSION: 1. Endotracheal tube above the carina. 2. Bilateral confluent airspace opacities with slight interval improvement since the prior radiograph. Follow-up recommended. Electronically Signed   By: Anner Crete M.D.   On: 04/23/2019 19:31   DG CHEST PORT 1 VIEW  Result Date: 04/22/2019 CLINICAL DATA:  ET tube repositioning EXAM: PORTABLE CHEST 1 VIEW COMPARISON:  April 22, 2019 FINDINGS: The ET tube is been advanced and now terminates in the mid trachea in good position. The NG tube terminates below today's film. Bilateral pulmonary infiltrates are stable. No pneumothorax. No other abnormalities. IMPRESSION: 1. The ETT is in good position. The NG tube terminates below today's film. 2. Bilateral pulmonary infiltrates, left greater than right, are stable since the study performed earlier today at 4:07 p.m. Electronically Signed   By: Dorise Bullion III M.D   On: 04/22/2019 16:49   DG CHEST PORT 1 VIEW  Result Date: 04/22/2019 CLINICAL DATA:  ET tube repositioning EXAM: PORTABLE CHEST 1 VIEW COMPARISON:  April 22, 2019 at 1:54 a.m. FINDINGS: The ETT has been partially withdrawn  in the interval terminating 3 cm below the thoracic inlet and 7.9 cm above the carina. The NG tube terminates below today's film. No pneumothorax. Bilateral perihilar pulmonary infiltrates are stable on the right and worsened on the left in the interval. The cardiomediastinal silhouette is stable. IMPRESSION: 1. The ET tube has been partially withdrawn in the interval. The distal tip is now 3 cm below the thoracic inlet in adequate position. However, the ETT is  relatively high riding, 7.9 cm above the carina. Consider advancing 2 or 3 cm. 2. The NG tube terminates below today's film. 3. Stable right pulmonary infiltrate. 4. Worsening left pulmonary infiltrate. Electronically Signed   By: Dorise Bullion III M.D   On: 04/22/2019 16:31   DG Chest Portable 1 View  Result Date: 04/22/2019 CLINICAL DATA:  Intubation. Orogastric tube placement. EXAM: PORTABLE CHEST 1 VIEW COMPARISON:  None. FINDINGS: Endotracheal tube tip at the thoracic inlet. Enteric tube in place tip below the diaphragm not included in the field of view. Symmetric low lung volumes. Confluent bilateral perihilar opacities. Heart is normal in size. No pleural fluid or pneumothorax. No acute osseous abnormalities are seen. IMPRESSION: 1. Endotracheal tube tip at the thoracic inlet. Enteric tube in place tip below the diaphragm not included in the field of view. 2. Confluent bilateral perihilar opacities, favor aspiration/pneumonia over pulmonary edema. Electronically Signed   By: Keith Rake M.D.   On: 04/22/2019 02:15       Subjective: Orthopnea is resolved, no swelling.  No fever, cough.  He has some mild chest tightness.  He is able to walk for several minutes without hypoxia or dyspnea.  No confusion, sputum, hemoptysis, vomiting.  Discharge Exam: Vitals:   04/29/19 1101 04/29/19 1413  BP: 132/80 140/80  Pulse: (!) 55 77  Resp: 20 16  Temp:  98.9 F (37.2 C)  SpO2: 96% 94%   Vitals:   04/29/19 1056 04/29/19 1059 04/29/19 1101 04/29/19 1413  BP: (!) 133/91  132/80 140/80  Pulse: 72 92 (!) 55 77  Resp: _0 Temp:    98.9 F (37.2 C)  TempSrc:    Oral  SpO2: 95% (!) 89% 96% 94%  Weight:      Height:        General: Pt is alert, awake, not in acute distress Cardiovascular: RRR, nl S1-S2, no murmurs appreciated.   No LE edema.   Respiratory: Normal respiratory rate and rhythm.  CTAB without rales or wheezes. Abdominal: Abdomen soft and non-tender.  No distension  or HSM.   Neuro/Psych: Strength symmetric in upper and lower extremities.  Judgment and insight appear normal.   The results of significant diagnostics from this hospitalization (including imaging, microbiology, ancillary and laboratory) are listed below for reference.     Microbiology: Recent Results (from the past 240 hour(s))  Respiratory Panel by RT PCR (Flu A&B, Covid) - Nasopharyngeal Swab     Status: None   Collection Time: 04/22/19  1:59 AM   Specimen: Nasopharyngeal Swab  Result Value Ref Range Status   SARS Coronavirus 2 by RT PCR NEGATIVE NEGATIVE Final    Comment: (NOTE) SARS-CoV-2 target nucleic acids are NOT DETECTED. The SARS-CoV-2 RNA is generally detectable in upper respiratoy specimens during the acute phase of infection. The lowest concentration of SARS-CoV-2 viral copies this assay can detect is 131 copies/mL. A negative result does not preclude SARS-Cov-2 infection and should not be used as the sole basis for treatment or other patient management  decisions. A negative result may occur with  improper specimen collection/handling, submission of specimen other than nasopharyngeal swab, presence of viral mutation(s) within the areas targeted by this assay, and inadequate number of viral copies (<131 copies/mL). A negative result must be combined with clinical observations, patient history, and epidemiological information. The expected result is Negative. Fact Sheet for Patients:  PinkCheek.be Fact Sheet for Healthcare Providers:  GravelBags.it This test is not yet ap proved or cleared by the [place] FDA and  has been authorized for detection and/or diagnosis of SARS-CoV-2 by FDA under an Emergency Use Authorization (EUA). This EUA will remain  in effect (meaning this test can be used) for the duration of the COVID-19 declaration under Section 564(b)(1) of the Act, 21 U.S.C. section 360bbb-3(b)(1),  unless the authorization is terminated or revoked sooner.    Influenza A by PCR NEGATIVE NEGATIVE Final   Influenza B by PCR NEGATIVE NEGATIVE Final    Comment: (NOTE) The Xpert Xpress SARS-CoV-2/FLU/RSV assay is intended as an aid in  the diagnosis of influenza from Nasopharyngeal swab specimens and  should not be used as a sole basis for treatment. Nasal washings and  aspirates are unacceptable for Xpert Xpress SARS-CoV-2/FLU/RSV  testing. Fact Sheet for Patients: PinkCheek.be Fact Sheet for Healthcare Providers: GravelBags.it This test is not yet approved or cleared by the [place] FDA and  has been authorized for detection and/or diagnosis of SARS-CoV-2 by  FDA under an Emergency Use Authorization (EUA). This EUA will remain  in effect (meaning this test can be used) for the duration of the  Covid-19 declaration under Section 564(b)(1) of the Act, 21  U.S.C. section 360bbb-3(b)(1), unless the authorization is  terminated or revoked. Performed at [facility], [place] [address] [place], [place] [address]   Blood culture (routine x 2)     Status: None   Collection Time: 04/22/19  2:29 AM   Specimen: BLOOD  Result Value Ref Range Status   Specimen Description   Final    BLOOD RIGHT HAND Performed at [facility] [place] [address] [place], [place] [address]    Special Requests   Final    BOTTLES DRAWN AEROBIC AND ANAEROBIC Blood Culture results may not be optimal due to an inadequate volume of blood received in culture bottles Performed at [place] [address], [place], [place] [address]    Culture   Final    NO GROWTH 5 DAYS Performed at [facility], [place] [address], [place], [place] [address]    Report Status 04/27/2019 FINAL  Final  Blood culture (routine x 2)     Status: None   Collection Time: 04/22/19  2:29 AM   Specimen: BLOOD  Result  Value Ref Range Status   Specimen Description   Final    BLOOD RIGHT ANTECUBITAL Performed at [facility], [place] [address] [place], [place] [address]    Special Requests   Final    BOTTLES DRAWN AEROBIC AND ANAEROBIC Blood Culture adequate volume Performed at [place] [address] [place], [place] [address]    Culture   Final    NO GROWTH 5 DAYS Performed at [facility], [place] [address] [place], [place] [address]    Report Status 04/27/2019 FINAL  Final  Culture, respiratory (tracheal aspirate)     Status: None   Collection Time: 04/22/19  4:50 AM   Specimen: Tracheal Aspirate; Respiratory  Result  Value Ref Range Status   Specimen Description   Final    TRACHEAL ASPIRATE Performed at [place] [address], [place], [place] [address]    Special Requests   Final    NONE Performed at [facility], [place] [address], [place], [place] [address]    Gram Stain   Final    FEW WBC PRESENT, PREDOMINANTLY PMN NO ORGANISMS SEEN    Culture   Final    Consistent with normal respiratory flora. Performed at [facility], [place] [address] [place], [place] [address]    Report Status 04/24/2019 FINAL  Final  MRSA PCR Screening     Status: None   Collection Time: 04/22/19  4:58 AM   Specimen: Nasopharyngeal  Result Value Ref Range Status   MRSA by PCR NEGATIVE NEGATIVE Final    Comment:        The GeneXpert MRSA Assay (FDA approved for NASAL specimens only), is one component of a comprehensive MRSA colonization surveillance program. It is not intended to diagnose MRSA infection nor to guide or monitor treatment for MRSA infections. Performed at [facility], [place] [address], [place], [place] [address]   Culture, blood (routine x 2)     Status: None   Collection Time: 04/22/19  6:09 AM   Specimen: BLOOD  Result Value Ref Range Status   Specimen Description   Final    BLOOD  BLOOD RIGHT HAND Performed at [facility] [address], [place], [place] [address]    Special Requests   Final    BOTTLES DRAWN AEROBIC ONLY Blood Culture results may not be optimal due to an inadequate volume of blood received in culture bottles Performed at [place] [address] [place], [place] [address]    Culture   Final    NO GROWTH 5 DAYS Performed at [facility], [place] [address], [place], [place] [address]    Report Status 04/27/2019 FINAL  Final  SARS CORONAVIRUS 2 (TAT 6-24 HRS) Nasopharyngeal Nasopharyngeal Swab     Status: None   Collection Time: 04/27/19  2:30 PM   Specimen: Nasopharyngeal Swab  Result Value Ref Range Status   SARS Coronavirus 2 NEGATIVE NEGATIVE Final    Comment: (NOTE) SARS-CoV-2 target nucleic acids are NOT DETECTED. The SARS-CoV-2 RNA is generally detectable in upper and lower respiratory specimens during the acute phase of infection. Negative results do not preclude SARS-CoV-2 infection, do not rule out co-infections with other pathogens, and should not be used as the sole basis for treatment or other patient management decisions. Negative results must be combined with clinical observations, patient history, and epidemiological information. The expected result is Negative. Fact Sheet for Patients: SugarRoll.be Fact Sheet for Healthcare Providers: https://www.woods-mathews.com/ This test is not yet approved or cleared by the [place] FDA and  has been authorized for detection and/or diagnosis of SARS-CoV-2 by FDA under an Emergency Use Authorization (EUA). This EUA will remain  in effect (meaning this test can be used) for the duration of the COVID-19 declaration under Section 56 4(b)(1) of the Act, 21 U.S.C. section 360bbb-3(b)(1), unless the authorization is terminated or revoked sooner. Performed at [facility], [place] [address],  [place], [place] [address]      Labs: BNP (last 3 results) No results for input(s): BNP in the last 8760 hours. Basic Metabolic Panel: Recent Labs  Lab 04/24/19 0216 04/25/19 0211 04/26/19 8841 04/27/19  0569 04/28/19 0928 04/29/19 0506  NA 140 139 142 135 140 138  K 4.6 4.1 3.1* 2.7* 3.1* 3.7  CL 106 105 104 103 104 97*  CO2 _0 GLUCOSE 143* 117* 126* 227* 203* 133*  BUN 25* _1 CREATININE 1.39* 0.84 0.85 1.00 0.91 0.77  CALCIUM 8.1* 8.7* 8.8* 8.3* 8.4* 8.2*  MG 2.4 2.1 2.0 2.0 2.0 2.2  PHOS 2.0* 2.0*  --  3.6 3.6 3.7   Liver Function Tests: Recent Labs  Lab 04/24/19 0216 04/25/19 0211 04/27/19 0927 04/28/19 0928 04/29/19 0506  AST 108* 214* 43* 39 47*  ALT 62* 86* 64* 71* 89*  ALKPHOS 55 72 61 58 63  BILITOT 0.7 1.0 0.4 0.5 0.9  PROT 6.0* 6.8 6.4* 6.5 7.2  ALBUMIN 3.1* 3.2* 2.9* 3.1* 3.4*   No results for input(s): LIPASE, AMYLASE in the last 168 hours. No results for input(s): AMMONIA in the last 168 hours. CBC: Recent Labs  Lab 04/25/19 0211 04/26/19 0842 04/27/19 0927 04/28/19 0928 04/29/19 0506  WBC 12.3* 11.6* 11.7* 11.1* 11.6*  NEUTROABS  --  9.4* 9.2* 8.3* 7.8*  HGB 11.8* 13.1 12.9* 13.3 14.1  HCT 36.8* 40.0 39.6 40.8 43.5  MCV 95.1 93.0 93.6 92.7 92.9  PLT 194 262 290 309 350   Cardiac Enzymes: No results for input(s): CKTOTAL, CKMB, CKMBINDEX, TROPONINI in the last 168 hours. BNP: Invalid input(s): POCBNP CBG: Recent Labs  Lab 04/29/19 0017 04/29/19 0519 04/29/19 0811 04/29/19 1154 04/29/19 1623  GLUCAP 106* 132* 121* 110* 116*   D-Dimer Recent Labs    04/27/19 1052  DDIMER 3.14*   Hgb A1c No results for input(s): HGBA1C in the last 72 hours. Lipid Profile No results for input(s): CHOL, HDL, LDLCALC, TRIG, CHOLHDL, LDLDIRECT in the last 72 hours. Thyroid function studies No results for input(s): TSH, T4TOTAL, T3FREE, THYROIDAB in the last 72 hours.  Invalid input(s): FREET3 Anemia work up Recent  Labs    04/27/19 1052  FERRITIN 180   Urinalysis    Component Value Date/Time   COLORURINE YELLOW 04/22/2019 1039   APPEARANCEUR HAZY (A) 04/22/2019 1039   LABSPEC 1.024 04/22/2019 1039   PHURINE 5.0 04/22/2019 1039   GLUCOSEU 150 (A) 04/22/2019 1039   HGBUR NEGATIVE 04/22/2019 1039   BILIRUBINUR NEGATIVE 04/22/2019 1039   BILIRUBINUR negative 05/11/2018 1418   KETONESUR 5 (A) 04/22/2019 1039   PROTEINUR 100 (A) 04/22/2019 1039   UROBILINOGEN 0.2 05/11/2018 1418   NITRITE NEGATIVE 04/22/2019 1039   LEUKOCYTESUR NEGATIVE 04/22/2019 1039   Sepsis Labs Invalid input(s): PROCALCITONIN,  WBC,  LACTICIDVEN Microbiology Recent Results (from the past 240 hour(s))  Respiratory Panel by RT PCR (Flu A&B, Covid) - Nasopharyngeal Swab     Status: None   Collection Time: 04/22/19  1:59 AM   Specimen: Nasopharyngeal Swab  Result Value Ref Range Status   SARS Coronavirus 2 by RT PCR NEGATIVE NEGATIVE Final    Comment: (NOTE) SARS-CoV-2 target nucleic acids are NOT DETECTED. The SARS-CoV-2 RNA is generally detectable in upper respiratoy specimens during the acute phase of infection. The lowest concentration of SARS-CoV-2 viral copies this assay can detect is 131 copies/mL. A negative result does not preclude SARS-Cov-2 infection and should not be used as the sole basis for treatment or other patient management decisions. A negative result may occur with  improper specimen collection/handling, submission of specimen other than nasopharyngeal swab, presence of viral mutation(s) within the areas targeted  by this assay, and inadequate number of viral copies (<131 copies/mL). A negative result must be combined with clinical observations, patient history, and epidemiological information. The expected result is Negative. Fact Sheet for Patients:  PinkCheek.be Fact Sheet for Healthcare Providers:  GravelBags.it This test is not yet ap  proved or cleared by the [place] FDA and  has been authorized for detection and/or diagnosis of SARS-CoV-2 by FDA under an Emergency Use Authorization (EUA). This EUA will remain  in effect (meaning this test can be used) for the duration of the COVID-19 declaration under Section 564(b)(1) of the Act, 21 U.S.C. section 360bbb-3(b)(1), unless the authorization is terminated or revoked sooner.    Influenza A by PCR NEGATIVE NEGATIVE Final   Influenza B by PCR NEGATIVE NEGATIVE Final    Comment: (NOTE) The Xpert Xpress SARS-CoV-2/FLU/RSV assay is intended as an aid in  the diagnosis of influenza from Nasopharyngeal swab specimens and  should not be used as a sole basis for treatment. Nasal washings and  aspirates are unacceptable for Xpert Xpress SARS-CoV-2/FLU/RSV  testing. Fact Sheet for Patients: PinkCheek.be Fact Sheet for Healthcare Providers: GravelBags.it This test is not yet approved or cleared by the [place] FDA and  has been authorized for detection and/or diagnosis of SARS-CoV-2 by  FDA under an Emergency Use Authorization (EUA). This EUA will remain  in effect (meaning this test can be used) for the duration of the  Covid-19 declaration under Section 564(b)(1) of the Act, 21  U.S.C. section 360bbb-3(b)(1), unless the authorization is  terminated or revoked. Performed at [facility], [place] [address] [place], [place] [address]   Blood culture (routine x 2)     Status: None   Collection Time: 04/22/19  2:29 AM   Specimen: BLOOD  Result Value Ref Range Status   Specimen Description   Final    BLOOD RIGHT HAND Performed at [place] [address] [place], [place] [address]    Special Requests   Final    BOTTLES DRAWN AEROBIC AND ANAEROBIC Blood Culture results may not be optimal due to an inadequate volume of blood received in culture bottles Performed at  [place] [address], [place], [place] [address]    Culture   Final    NO GROWTH 5 DAYS Performed at [facility], [place] [address], [place], [place] [address]    Report Status 04/27/2019 FINAL  Final  Blood culture (routine x 2)     Status: None   Collection Time: 04/22/19  2:29 AM   Specimen: BLOOD  Result Value Ref Range Status   Specimen Description   Final    BLOOD RIGHT ANTECUBITAL Performed at [facility], [place] [address], [place], [place] [address]    Special Requests   Final    BOTTLES DRAWN AEROBIC AND ANAEROBIC Blood Culture adequate volume Performed at [place] [address], [place], [place] [address]    Culture   Final    NO GROWTH 5 DAYS Performed at [facility], [place] [address] [place], [place] [address]    Report Status 04/27/2019 FINAL  Final  Culture, respiratory (tracheal aspirate)     Status: None   Collection Time: 04/22/19  4:50 AM   Specimen: Tracheal Aspirate; Respiratory  Result Value Ref Range Status   Specimen Description   Final    TRACHEAL ASPIRATE Performed at [place] [name]  [place], [place] [address]    Special Requests   Final    NONE Performed at [facility], [place] [address], [place], [place] [address]    Gram Stain   Final    FEW WBC PRESENT, PREDOMINANTLY PMN NO ORGANISMS SEEN    Culture   Final    Consistent with normal respiratory flora. Performed at [facility], [place] [address], [place], [place] [address]    Report Status 04/24/2019 FINAL  Final  MRSA PCR Screening     Status: None   Collection Time: 04/22/19  4:58 AM   Specimen: Nasopharyngeal  Result Value Ref Range Status   MRSA by PCR NEGATIVE NEGATIVE Final    Comment:        The GeneXpert MRSA Assay (FDA approved for NASAL specimens only), is one component of a comprehensive MRSA colonization surveillance program. It is not intended to  diagnose MRSA infection nor to guide or monitor treatment for MRSA infections. Performed at [facility], [place] [address], [place], [place] [address]   Culture, blood (routine x 2)     Status: None   Collection Time: 04/22/19  6:09 AM   Specimen: BLOOD  Result Value Ref Range Status   Specimen Description   Final    BLOOD BLOOD RIGHT HAND Performed at [place] [address], [place], [place] [address]    Special Requests   Final    BOTTLES DRAWN AEROBIC ONLY Blood Culture results may not be optimal due to an inadequate volume of blood received in culture bottles Performed at [place] [address] [place], [place] [address]    Culture   Final    NO GROWTH 5 DAYS Performed at [facility], [place] [address], [place], [place] [address]    Report Status 04/27/2019 FINAL  Final  SARS CORONAVIRUS 2 (TAT 6-24 HRS) Nasopharyngeal Nasopharyngeal Swab     Status: None   Collection Time: 04/27/19  2:30 PM   Specimen: Nasopharyngeal Swab  Result Value Ref Range Status   SARS Coronavirus 2 NEGATIVE NEGATIVE Final    Comment: (NOTE) SARS-CoV-2 target nucleic acids are NOT DETECTED. The SARS-CoV-2 RNA is generally detectable in upper and lower respiratory specimens during the acute phase of infection. Negative results do not preclude SARS-CoV-2 infection, do not rule out co-infections with other pathogens, and should not be used as the sole basis for treatment or other patient management decisions. Negative results must be combined with clinical observations, patient history, and epidemiological information. The expected result is Negative. Fact Sheet for Patients: SugarRoll.be Fact Sheet for Healthcare Providers: https://www.woods-mathews.com/ This test is not yet approved or cleared by the [place] FDA and  has been authorized for detection and/or diagnosis of SARS-CoV-2  by FDA under an Emergency Use Authorization (EUA). This EUA will remain  in effect (meaning this test can be used) for the duration of the COVID-19 declaration under Section 56 4(b)(1) of the Act, 21 U.S.C. section 360bbb-3(b)(1), unless the authorization is terminated or revoked sooner. Performed at [facility], [place] [address] [place], [place] [address]      Time coordinating discharge: 35 minutes      SIGNED:   Edwin Dada, MD  [facility] 04/29/2019, 7:21 PM

## 2019-05-05 NOTE — Progress Notes (Signed)
[place] at [facility] [address] [place], [place] [address] [phone]  Date:  05/07/2019   Name:  Perry Li   DOB:  May 15, 1985   MRN:  [id number]  PCP:  Darreld Mclean, MD    Chief Complaint: No chief complaint on file.   History of Present Illness:  Perry Li is a 34 y.o. very pleasant male patient who presents with the following:  Pt with history of DM, hyperlipidemia Recently inpt with a drug overdose-  His UDS dated 12/20 positive for benzo and cocaine Virtual visit today, I am at the office the patient is at home.  Patient identity confirmed with 2 factors, he gives consent for virtual visit today.  The patient and myself are on the call today  Admit date: 04/22/2019 Discharge date: 04/29/2019 Admitted From: Home  Disposition:  Home   Recommendations for Outpatient Follow-up:  1. Follow up with PCP Dr. Lorelei Pont in 1 week 2. Dr. Lorelei Pont: Please evaluate for residual fluid overload, obtain BMP to evaluate Cr, K   3. Dr. Lorelei Pont: Please taper clonazepam and refer to behavioral health  Brief/Interim Summary: Perry Li is a 34 y.o. M with anxiety, DM, and obesity who presented due to being found unresponsive and cyanotic by family.  EMS gave Narcan, after which patient awoke and vomited and became combative.  In the ER, patient required non-rebreather, then became more hypoxic and was intubated.  Per report, snorted trazodone, and had alcohol in serum.  Also prescribed clonazepam TID.  PRINCIPAL HOSPITAL DIAGNOSIS: Clonazepam, trazodone, alcohol, and opiate overdose, complicated by aspiration pneumonia  Discharge Diagnoses:   Benzodiazepine, trazodone, alcohol, and opiate overdose Drug withdrawal delirium Was admitted and intubated.  Found to have acute hypoxic and hypercarbic respiratory failure due to drug overdose, requiring intubation.  Extubated after 48 hours. After extubation, the  patient did require Precedex for 24 hours.  Subsequently evaluated by psychiatry who recommended benzodiazepine taper and started sertraline.  As the patient presented no imminent risk to self or others, psychiatry recommended outpatient psychiatry follow-up. Aspiration pneumonia Patient aspirated with EMS, and then again in the ER.  Chest x-ray showed new opacities, and so the patient was started on empiric antibiotics.  He was treated with 7 days Unasyn, and had no further fever. Acute diastolic CHF Patient with no previous history of congestive heart failure and normal ejection fraction.  After transfer out of the intensive care unit, the patient remained hypoxic, orthopnea, and with dyspnea on exertion.  He was treated with IV Lasix and his hypoxia resolved.   On the day of discharge, the patient was able to ambulate without hypoxia.  He should follow-up closely with his PCP to determine if further diuresis will be warranted. Diabetes Glucoses well controlled in the hospital.  Pt notes that he thought was taking oxycodone- he is not sure what he might have actually taken.  He snorted the pill.  He states he never intended to hurt himself, he was not attempting to commit suicide He was started on sertraline while inpt and is taking this without any ill effect so far He notes no further illicit drug use since he got home We discussed narcan but he states he does not want to use opoid any longer, he does not think he needs Narcan  He is taking 1 mg three times a day of klonopin right now He also did have an aspiration pneumonia while inpatient, he is  recovering and his breathing is getting much better No fever noted   We discussed need to taper him off of clonazepam.  He is apprehensive about this, does not know how he will manage without this drug.  However, he does understand need to come off of it since his recent drug overdose.  He started on sertraline just about a week ago, we do not expect  it to have taken much effect yet.  I reassured him that we will gradually taper his clonazepam in a way that he can handle  I asked him to continue current clonazepam regimen for 2 weeks, and then cut his midday dose to 1/2 mg.  He will try to do this, will let me know how it works for him.  I will ask him to schedule to see me in person in 1 month  Lab Results  Component Value Date   HGBA1C 6.1 (H) 04/22/2019     Patient Active Problem List   Diagnosis Date Noted  . Drug overdose   . Acute respiratory failure ([place]) 04/22/2019  . 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 Elevated LFTs 05/23/2018  . Hyperglycemia 05/12/2018  . DKA (diabetic ketoacidoses) ([place]) 05/11/2018  . Right Achilles tendinitis 08/15/2014  . High cholesterol   . Anxiety   . Insomnia     Past Medical History:  Diagnosis Date  . Anxiety   . Chest pain   . High cholesterol   . Insomnia   . SOB (shortness of breath)     Past Surgical History:  Procedure Laterality Date  . NO PAST SURGERIES      Social History   Tobacco Use  . Smoking status: Never Smoker  . Smokeless tobacco: Never Used  Substance Use Topics  . Alcohol use: Yes    Alcohol/week: 0.0 standard drinks    Comment: occasional  . Drug use: No    Family History  Problem Relation Age of Onset  . Healthy Mother   . Hypertension Father   . Diabetes Maternal Grandmother     No Known Allergies  Medication list has been reviewed and updated.  Current Outpatient Medications on File Prior to Visit  Medication Sig Dispense Refill  . blood glucose meter kit and supplies KIT Dispense based on patient and insurance preference. Use up to four times daily as directed. (FOR ICD-9 250.00, 250.01). 1 each 0  . blood glucose meter kit and supplies Dispense based on patient and insurance preference. Use up to twice daily as directed. (FOR ICD-10 E10.9, E11.9). 1 each 0  . clonazePAM (KLONOPIN) 1 MG tablet TAKE 1 TABLET(1 MG) BY MOUTH  Three times DAILY. (Patient taking differently: Take 1 mg by mouth 3 (three) times daily. ) 90 tablet 2  . metFORMIN (GLUCOPHAGE XR) 500 MG 24 hr tablet Take 1 tablet (500 mg total) by mouth daily with breakfast. 90 tablet 3  . sertraline (ZOLOFT) 50 MG tablet Take 1 tablet (50 mg total) by mouth daily. 30 tablet 2  . traZODone (DESYREL) 50 MG tablet Take 1 tablet (50 mg total) by mouth at bedtime. 90 tablet 3   No current facility-administered medications on file prior to visit.    Review of Systems:  As per HPI- otherwise negative. No fever  Physical Examination: There were no vitals filed for this visit. There were no vitals filed for this visit. There is no height or weight on file to calculate BMI. 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  Tycen unfortunately overdosed on narcotics, benzos, alcohol.  Fortunately he survived and is back home, doing much better.  He states he has learned from this, and is not plan to use any more illicit drugs.  He is apprehensive about tapering clonazepam, but understands why I wish to have him do this.  We discussed a plan to gradually taper, and have him follow-up with me in 1 month Per patient instructions:  It was great to see you again today, virtually.  I am so glad that you are out of the hospital and doing well  As we discussed, given recent events we need to gradually get you off clonazepam.  I understand that this seems overwhelming, please be assured we will do this very slowly.  The sertraline that you recently started should also help to control anxiety, but it does take some time to start working. Please continue your current clonazepam dosage for the  next 2 weeks, and then please reduce your midday dose to half tablet.  We will continue to gradually taper as tolerated  Please come and see me in about 1 month in the office, so we can check on how you are doing-let me know sooner if not doing okay   This visit occurred during the SARS-CoV-2 public health emergency.  Safety protocols were in place, including screening questions prior to the visit, additional usage of staff PPE, and extensive cleaning of exam room while observing appropriate contact time as indicated for disinfecting solutions.    Signed Lamar Blinks, MD

## 2019-05-07 ENCOUNTER — Ambulatory Visit (INDEPENDENT_AMBULATORY_CARE_PROVIDER_SITE_OTHER): Payer: 59 | Admitting: Family Medicine

## 2019-05-07 ENCOUNTER — Encounter: Payer: Self-pay | Admitting: Family Medicine

## 2019-05-07 ENCOUNTER — Other Ambulatory Visit: Payer: Self-pay

## 2019-05-07 DIAGNOSIS — E109 Type 1 diabetes mellitus without complications: Secondary | ICD-10-CM

## 2019-05-07 DIAGNOSIS — F411 Generalized anxiety disorder: Secondary | ICD-10-CM

## 2019-05-07 DIAGNOSIS — T50901A Poisoning by unspecified drugs, medicaments and biological substances, accidental (unintentional), initial encounter: Secondary | ICD-10-CM

## 2019-05-07 NOTE — Patient Instructions (Signed)
It was great to see you again today, virtually.  I am so glad that you are out of the hospital and doing well  As we discussed, given recent events we need to gradually get you off clonazepam.  I understand that this seems overwhelming, please be assured we will do this very slowly.  The sertraline that you recently started should also help to control anxiety, but it does take some time to start working. Please continue your current clonazepam dosage for the next 2 weeks, and then please reduce your midday dose to half tablet.  We will continue to gradually taper as tolerated  Please come and see me in about 1 month in the office, so we can check on how you are doing-let me know sooner if not doing okay

## 2019-05-10 ENCOUNTER — Other Ambulatory Visit: Payer: Self-pay | Admitting: Internal Medicine

## 2019-05-10 DIAGNOSIS — E1310 Other specified diabetes mellitus with ketoacidosis without coma: Secondary | ICD-10-CM

## 2019-05-14 ENCOUNTER — Encounter: Payer: Self-pay | Admitting: Family Medicine

## 2019-06-10 ENCOUNTER — Encounter: Payer: Self-pay | Admitting: Family Medicine

## 2019-06-14 ENCOUNTER — Telehealth: Payer: Self-pay | Admitting: Family Medicine

## 2019-06-14 ENCOUNTER — Ambulatory Visit (INDEPENDENT_AMBULATORY_CARE_PROVIDER_SITE_OTHER): Payer: 59 | Admitting: Family Medicine

## 2019-06-14 DIAGNOSIS — Z5329 Procedure and treatment not carried out because of patient's decision for other reasons: Secondary | ICD-10-CM

## 2019-06-14 DIAGNOSIS — F411 Generalized anxiety disorder: Secondary | ICD-10-CM

## 2019-06-14 DIAGNOSIS — Z0289 Encounter for other administrative examinations: Secondary | ICD-10-CM

## 2019-06-14 MED ORDER — CLONAZEPAM 1 MG PO TABS: ORAL_TABLET | ORAL | 0 refills | Status: DC

## 2019-06-14 NOTE — Progress Notes (Deleted)
[place] at [facility] [address] [place], [place] [address] [phone]  Date:  06/14/2019   Name:  Perry Li   DOB:  1985/07/20   MRN:  [id number]  PCP:  Darreld Mclean, MD    Chief Complaint: No chief complaint on file.   History of Present Illness:  Perry Li is a 34 y.o. very pleasant male patient who presents with the following:  Patient with history of diabetes, hyperlipidemia, anxiety, and recent accidental overdose here today for follow-up visit I saw him most recently on January 4 for virtual follow-up He was admitted for a week in December 2020 following an accidental polysubstance overdose, he required intubation for 48 hours, had aspiration pneumonia and acute diastolic CHF which resolved  He was started on sertraline while inpatient, and discharged home still on 1 mg of clonazepam 3 times a day with request for me to taper this drug for him and try to set him up with a psychiatrist At last visit he was apprehensive about gradually taking off clonazepam, later told me he intends to continue taking this drug.  I was not comfortable with this plan and did not feel it is safe, I have communicated this with him.  He stated plan to find a new PCP as of May 18, 2019  He then contacted me on February 7 and requested a 90-day refill of clonazepam.  I declined to do so, he then made this appointment for today I also canceled one remaining refill on his previous clonazepam rx  Refilled 90 clonazepam on January 13 using a prescription I previously provided.  I have given him instructions on how to taper this drug previously   05/16/2019  1   04/16/2019  Clonazepam 1 MG Tablet  90.00  30 Je Cop   [id number]   Wal ([id number])   1  6.00 LME  Comm Ins   [place]  04/16/2019  1   04/16/2019  Clonazepam 1 MG Tablet  90.00  30 Je Cop   [id number]   Wal ([id number])   0  6.00 LME  Comm Ins   [place]  03/21/2019  1   01/15/2019  Clonazepam 1  MG Tablet  90.00  30 Je Cop   [id number]   Wal ([id number])   2  6.00 LME  Comm Ins   [place]  02/20/2019  1   01/15/2019  Clonazepam 1 MG Tablet  90.00  30 Je Cop   [id number]   Wal ([id number])   1  6.00 LME  Comm Ins   [place]  01/24/2019  1   01/15/2019  Clonazepam 1 MG Tablet  90.00  30 Je Cop   [id number]   Wal ([id number])   0  6.00 LME  Comm Ins   [place]  12/26/2018  1   09/08/2018  Clonazepam 1 MG Tablet  90.00  30 Je Cop   [id number]   Wal ([id number])   2  6.00 LME        Patient Active Problem List   Diagnosis Date Noted  . Drug overdose   . Acute respiratory failure ([place]) 04/22/2019  . 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 Elevated LFTs 05/23/2018  . Hyperglycemia 05/12/2018  . DKA (diabetic ketoacidoses) ([place]) 05/11/2018  . Right Achilles tendinitis 08/15/2014  . High cholesterol   . Anxiety   . Insomnia     Past Medical History:  Diagnosis Date  . Anxiety   .  Chest pain   . High cholesterol   . Insomnia   . SOB (shortness of breath)     Past Surgical History:  Procedure Laterality Date  . NO PAST SURGERIES      Social History   Tobacco Use  . Smoking status: Never Smoker  . Smokeless tobacco: Never Used  Substance Use Topics  . Alcohol use: Yes    Alcohol/week: 0.0 standard drinks    Comment: occasional  . Drug use: No    Family History  Problem Relation Age of Onset  . Healthy Mother   . Hypertension Father   . Diabetes Maternal Grandmother     No Known Allergies  Medication list has been reviewed and updated.  Current Outpatient Medications on File Prior to Visit  Medication Sig Dispense Refill  . blood glucose meter kit and supplies KIT Dispense based on patient and insurance preference. Use up to four times daily as directed. (FOR ICD-9 250.00, 250.01). 1 each 0  . blood glucose meter kit and supplies Dispense based on patient and insurance preference. Use up to twice daily as directed. (FOR ICD-10 E10.9, E11.9). 1 each 0  . clonazePAM (KLONOPIN) 1 MG tablet TAKE 1 TABLET(1  MG) BY MOUTH Three times DAILY. (Patient taking differently: Take 1 mg by mouth 3 (three) times daily. ) 90 tablet 2  . metFORMIN (GLUCOPHAGE XR) 500 MG 24 hr tablet Take 1 tablet (500 mg total) by mouth daily with breakfast. 90 tablet 3  . sertraline (ZOLOFT) 50 MG tablet Take 1 tablet (50 mg total) by mouth daily. 30 tablet 2  . traZODone (DESYREL) 50 MG tablet Take 1 tablet (50 mg total) by mouth at bedtime. 90 tablet 3   No current facility-administered medications on file prior to visit.    Review of Systems:  As per HPI- otherwise negative.   Physical Examination: There were no vitals filed for this visit. There were no vitals filed for this visit. There is no height or weight on file to calculate BMI. Ideal Body Weight:    GEN: no acute distress. HEENT: Atraumatic, Normocephalic.  Ears and Nose: No external deformity. CV: RRR, No M/G/R. No JVD. No thrill. No extra heart sounds. PULM: CTA B, no wheezes, crackles, rhonchi. No retractions. No resp. distress. No accessory muscle use. ABD: S, NT, ND, +BS. No rebound. No HSM. EXTR: No c/c/e PSYCH: Normally interactive. Conversant.    Assessment and Plan: ***  Signed Lamar Blinks, MD

## 2019-06-14 NOTE — Telephone Encounter (Signed)
Pt was a no show for visit today- per HPI that I had started:  I saw him most recently on January 4 for virtual follow-up He was admitted for a week in December 2020 following an accidental polysubstance overdose, he required intubation for 48 hours, had aspiration pneumonia and acute diastolic CHF which resolved  He was started on sertraline while inpatient, and discharged home still on 1 mg of clonazepam 3 times a day with request for me to taper this drug for him and try to set him up with a psychiatrist At last visit he was apprehensive about gradually taking off clonazepam, later told me he intends to continue taking this drug.  I was not comfortable with this plan and did not feel it is safe, I have communicated this with him.  He stated plan to find a new PCP as of May 18, 2019  He then contacted me on February 7 and requested a 90-day refill of clonazepam.  I declined to do so, he then made this appointment for today I also canceled one remaining refill on his previous clonazepam rx  He last refilled 90 clonazepam on January 13 using a prescription I previously provided.  I have given him instructions on how to taper this drug previously  I called and left detailed message on his machine I am not sure if he plans to see me any longer due to disagreement regarding his continuation of clonazepam I have canceled his last rx for clonazepam 1 mg TID as we had agreed to taper his dose at his last visit. I will rx clonazepam for him as he should not stop suddenly, but he will need to start tapering dose now if I am to prescribe for him  Meds ordered this encounter  Medications  . clonazePAM (KLONOPIN) 1 MG tablet    Sig: Take 1 mg po am, 1 mg pm, 0.5 mg at noon.  After 2 weeks decrease am dose to 0.5 mg also    Dispense:  75 tablet    Refill:  0   Will start formal dismissal process with office manager   05/16/2019  1   04/16/2019  Clonazepam 1 MG Tablet  90.00  30 Je Cop   [id number]   Wal  ([id number])   1  6.00 LME  Comm Ins   [place]  04/16/2019  1   04/16/2019  Clonazepam 1 MG Tablet  90.00  30 Je Cop   [id number]   Wal ([id number])   0  6.00 LME  Comm Ins   [place]  03/21/2019  1   01/15/2019  Clonazepam 1 MG Tablet  90.00  30 Je Cop   [id number]   Wal ([id number])   2  6.00 LME  Comm Ins   [place]  02/20/2019  1   01/15/2019  Clonazepam 1 MG Tablet  90.00  30 Je Cop   [id number]   Wal ([id number])   1  6.00 LME  Comm Ins   [place]  01/24/2019  1   01/15/2019  Clonazepam 1 MG Tablet  90.00  30 Je Cop   [id number]   Wal ([id number])   0  6.00 LME  Comm Ins   [place]  12/26/2018  1   09/08/2018  Clonazepam 1 MG Tablet  90.00  30 Je Cop   [id number]   Wal ([id number])   2  6.00 LME  Comm Ins   [place]  11/30/2018  1   09/08/2018  Clonazepam 1 MG Tablet  90.00  30 Je Cop   [id number]   Wal [phone])   1  6.00 LME  Comm Ins   [place]  11/02/2018  1   09/08/2018  Clonazepam 1 MG Tablet  90.00  30 Je Cop   [id number]   Wal ([id number])   0  6.00 LME  Comm Ins

## 2019-06-14 NOTE — Telephone Encounter (Signed)
[facility] called as a Research officer, political party, they received VM from Dr. Patsy Lager to discontinue the RS for clonazepam. Pt picked up refill on 06/12/2019 for #30 which was the last refill on file.

## 2019-06-20 ENCOUNTER — Encounter: Payer: Self-pay | Admitting: Family Medicine

## 2019-07-03 ENCOUNTER — Encounter: Payer: Self-pay | Admitting: Family Medicine

## 2019-07-03 ENCOUNTER — Telehealth: Payer: Self-pay

## 2019-07-03 DIAGNOSIS — F411 Generalized anxiety disorder: Secondary | ICD-10-CM

## 2019-07-03 MED ORDER — CLONAZEPAM 1 MG PO TABS: ORAL_TABLET | ORAL | 0 refills | Status: DC

## 2019-07-03 NOTE — Telephone Encounter (Signed)
Patient has been dismissed. Are we even able to refill?

## 2019-07-03 NOTE — Telephone Encounter (Signed)
Patient called in to see if Dr. Patsy Lager could send in a prescription for  clonazePAM (KLONOPIN) 1 MG tablet [[id number]   To: [id number] - [place], [place] - [address] AT [facility] OF [address]  [address], [place] [address]  Phone:  [phone] Fax:  [phone]  DEA #:  [id number]  Thanks,

## 2020-01-03 ENCOUNTER — Encounter (HOSPITAL_COMMUNITY): Payer: Self-pay

## 2020-01-03 ENCOUNTER — Emergency Department (HOSPITAL_COMMUNITY)
Admission: EM | Admit: 2020-01-03 | Discharge: 2020-01-04 | Disposition: A | Discharge status: Home / Self Care | Payer: 59 | Attending: Emergency Medicine | Admitting: Emergency Medicine

## 2020-01-03 DIAGNOSIS — R945 Abnormal results of liver function studies: Secondary | ICD-10-CM | POA: Diagnosis not present

## 2020-01-03 DIAGNOSIS — E876 Hypokalemia: Secondary | ICD-10-CM | POA: Insufficient documentation

## 2020-01-03 DIAGNOSIS — T402X1A Poisoning by other opioids, accidental (unintentional), initial encounter: Secondary | ICD-10-CM

## 2020-01-03 DIAGNOSIS — Z7984 Long term (current) use of oral hypoglycemic drugs: Secondary | ICD-10-CM | POA: Insufficient documentation

## 2020-01-03 DIAGNOSIS — R748 Abnormal levels of other serum enzymes: Secondary | ICD-10-CM

## 2020-01-03 LAB — COMPREHENSIVE METABOLIC PANEL
ALT: 143 U/L — ABNORMAL HIGH (ref 0–44)
AST: 80 U/L — ABNORMAL HIGH (ref 15–41)
Albumin: 4.2 g/dL (ref 3.5–5.0)
Alkaline Phosphatase: 55 U/L (ref 38–126)
Anion gap: 17 — ABNORMAL HIGH (ref 5–15)
BUN: 10 mg/dL (ref 6–20)
CO2: 18 mmol/L — ABNORMAL LOW (ref 22–32)
Calcium: 8.3 mg/dL — ABNORMAL LOW (ref 8.9–10.3)
Chloride: 104 mmol/L (ref 98–111)
Creatinine, Ser: 1.02 mg/dL (ref 0.61–1.24)
GFR calc Af Amer: >60 mL/min (ref >60)
GFR calc non Af Amer: >60 mL/min (ref >60)
Glucose, Bld: 194 mg/dL — ABNORMAL HIGH (ref 70–99)
Potassium: 3.1 mmol/L — ABNORMAL LOW (ref 3.5–5.1)
Sodium: 139 mmol/L (ref 135–145)
Total Bilirubin: 0.6 mg/dL (ref 0.3–1.2)
Total Protein: 7.2 g/dL (ref 6.5–8.1)

## 2020-01-03 LAB — CBC
HCT: 44.8 % (ref 39.0–52.0)
Hemoglobin: 14.6 g/dL (ref 13.0–17.0)
MCH: 29.4 pg (ref 26.0–34.0)
MCHC: 32.6 g/dL (ref 30.0–36.0)
MCV: 90.1 fL (ref 80.0–100.0)
Platelets: 293 10*3/uL (ref 150–400)
RBC: 4.97 MIL/uL (ref 4.22–5.81)
RDW: 11.9 % (ref 11.5–15.5)
WBC: 12.7 10*3/uL — ABNORMAL HIGH (ref 4.0–10.5)
nRBC: 0 % (ref 0.0–0.2)

## 2020-01-03 LAB — RAPID URINE DRUG SCREEN, HOSP PERFORMED
Amphetamines: NOT DETECTED
Barbiturates: NOT DETECTED
Benzodiazepines: POSITIVE — AB
Cocaine: NOT DETECTED
Opiates: POSITIVE — AB
Tetrahydrocannabinol: NOT DETECTED

## 2020-01-03 LAB — ETHANOL: Alcohol, Ethyl (B): 39 mg/dL — ABNORMAL HIGH (ref <10)

## 2020-01-03 NOTE — ED Triage Notes (Signed)
Pt comes via [facility], found in car unresponsive, reports snorted 3 oxycodone, assisted respirations for 7 minutes, PTA given 2mg  narcan IV. Pt denies SI/HI/AVH, states he was just trying to get high

## 2020-01-04 LAB — I-STAT VENOUS BLOOD GAS, ED
Acid-Base Excess: 0 mmol/L (ref 0.0–2.0)
Bicarbonate: 26.5 mmol/L (ref 20.0–28.0)
Calcium, Ion: 1.12 mmol/L — ABNORMAL LOW (ref 1.15–1.40)
HCT: 45 % (ref 39.0–52.0)
Hemoglobin: 15.3 g/dL (ref 13.0–17.0)
O2 Saturation: 88 %
Potassium: 4.2 mmol/L (ref 3.5–5.1)
Sodium: 141 mmol/L (ref 135–145)
TCO2: 28 mmol/L (ref 22–32)
pCO2, Ven: 48.1 mmHg (ref 44.0–60.0)
pH, Ven: 7.349 (ref 7.250–7.430)
pO2, Ven: 58 mmHg — ABNORMAL HIGH (ref 32.0–45.0)

## 2020-01-04 LAB — I-STAT CHEM 8, ED
BUN: 11 mg/dL (ref 6–20)
Calcium, Ion: 1.08 mmol/L — ABNORMAL LOW (ref 1.15–1.40)
Chloride: 104 mmol/L (ref 98–111)
Creatinine, Ser: 0.9 mg/dL (ref 0.61–1.24)
Glucose, Bld: 129 mg/dL — ABNORMAL HIGH (ref 70–99)
HCT: 45 % (ref 39.0–52.0)
Hemoglobin: 15.3 g/dL (ref 13.0–17.0)
Potassium: 4.2 mmol/L (ref 3.5–5.1)
Sodium: 141 mmol/L (ref 135–145)
TCO2: 25 mmol/L (ref 22–32)

## 2020-01-04 MED ORDER — NALOXONE HCL 4 MG/0.1ML NA LIQD
1.0000 | Freq: Once | NASAL | Status: DC
  Filled 2020-01-04: qty 4

## 2020-01-04 MED ORDER — NALOXONE HCL 4 MG/0.1ML NA LIQD: NASAL | 0 refills | Status: AC

## 2020-01-04 MED ORDER — SODIUM CHLORIDE 0.9 % IV BOLUS
1000.0000 mL | Freq: Once | INTRAVENOUS | Status: AC
  Administered 2020-01-04: 1000 mL via INTRAVENOUS

## 2020-01-04 MED ORDER — POTASSIUM CHLORIDE CRYS ER 20 MEQ PO TBCR
40.0000 meq | EXTENDED_RELEASE_TABLET | Freq: Once | ORAL | Status: AC
  Administered 2020-01-04: 40 meq via ORAL
  Filled 2020-01-04: qty 2

## 2020-01-04 MED ORDER — POTASSIUM CHLORIDE ER 10 MEQ PO TBCR: 10.0000 meq | EXTENDED_RELEASE_TABLET | Freq: Every day | ORAL | 0 refills | Status: AC

## 2020-01-04 MED ORDER — NALOXONE HCL 4 MG/0.1ML NA LIQD: 0.4000 mg | NASAL | Status: DC | PRN

## 2020-01-04 NOTE — ED Provider Notes (Signed)
[facility] EMERGENCY DEPARTMENT Provider Note   CSN: [id number] Arrival date & time: 01/03/20  2155     History Chief Complaint  Patient presents with   Drug Overdose    Perry Li is a 34 y.o. male.  HPI   34 year old male with a history of anxiety, hyperlipidemia, insomnia, shortness of breath, who presents the emergency department today for evaluation of drug overdose.  Patient states he was in his car and snorted some oxycodone.  He then became unresponsive and was found by EMS.  EMS assisted respirations and administered Narcan.  Patient then became more responsive.  He states that he was not intending to harm himself and denies any suicidality states he was just trying to get high.  He also had had some alcohol but he denies any other coingestions.  At the time of my evaluation patient has been in the emergency department for over 9 hours.  He denies any current symptoms at this time.  He admits he is seeing a psychiatrist and also has a PCP in [facility]. He would like to stop using drugs.   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   Elevated LFTs 05/23/2018   Hyperglycemia 05/12/2018   DKA (diabetic ketoacidoses) ([place]) 05/11/2018   Right Achilles tendinitis 08/15/2014   High cholesterol    Anxiety    Insomnia     Past Surgical History:  Procedure Laterality Date   NO PAST SURGERIES         Family History  Problem Relation Age of Onset   Healthy Mother    Hypertension Father    Diabetes Maternal Grandmother     Social History   Tobacco Use   Smoking status: Never Smoker   Smokeless tobacco: Never Used  Vaping Use   Vaping Use: Never used  Substance Use  Topics   Alcohol use: Yes    Alcohol/week: 0.0 standard drinks    Comment: occasional   Drug use: No    Home Medications Prior to Admission medications   Medication Sig Start Date End Date Taking? Authorizing Provider  clonazePAM (KLONOPIN) 1 MG tablet Take 0.30m am,0.5 mg at noon and 1 mg at bedtime.  Last rx from this provider Patient taking differently: Take 0.5-1 mg by mouth See admin instructions. Take 136mam,0.5 mg at noon and 1 mg at bedtime. 07/03/19  Yes Copland, JeGay FillerMD  FLUoxetine (PROZAC) 20 MG capsule Take 20 mg by mouth daily. 12/04/19  Yes [provider]  hydroxypropyl methylcellulose / hypromellose (ISOPTO TEARS / GONIOVISC) 2.5 % ophthalmic solution Place 1 drop into both eyes as needed for dry eyes.   Yes [provider]  metFORMIN (GLUCOPHAGE XR) 500 MG 24 hr tablet Take 1 tablet (500 mg total) by mouth daily with breakfast. Patient taking differently: Take 500 mg by mouth every other day.  04/16/19  Yes Copland, JeGay FillerMD  traZODone (DESYREL) 150 MG tablet Take 75 mg by mouth at bedtime. 01/01/20  Yes [provider]  blood glucose meter kit and supplies KIT Dispense based on patient and insurance preference. Use up to four times daily as directed. (FOR ICD-9 250.00, 250.01).  05/14/18   Kayleen Memos, DO  blood glucose meter kit and supplies Dispense based on patient and insurance preference. Use up to twice daily as directed. (FOR ICD-10 E10.9, E11.9). 05/11/18   Copland, Gay Filler, MD  naloxone [facility]) nasal spray 4 mg/0.1 mL Spray into the nostril in the case of an overdose 01/04/20   Reyli Schroth S, PA-C  potassium chloride (KLOR-CON) 10 MEQ tablet Take 1 tablet (10 mEq total) by mouth daily for 5 days. 01/04/20 01/09/20  Maddi Collar S, PA-C  sertraline (ZOLOFT) 50 MG tablet Take 1 tablet (50 mg total) by mouth daily. Patient not taking: Reported on 01/04/2020 04/30/19   Edwin Dada, MD    Allergies    Patient has no known  allergies.  Review of Systems   Review of Systems  Constitutional: Negative for fever.  HENT: Negative for ear pain and sore throat.   Eyes: Negative for visual disturbance.  Respiratory: Negative for cough and shortness of breath.   Cardiovascular: Negative for chest pain.  Gastrointestinal: Negative for abdominal pain, constipation, diarrhea, nausea and vomiting.  Genitourinary: Negative for dysuria and hematuria.  Musculoskeletal: Negative for back pain.  Skin: Negative for rash.  Neurological: Negative for seizures and syncope.  Psychiatric/Behavioral: Negative for suicidal ideas.  All other systems reviewed and are negative.   Physical Exam Updated Vital Signs BP 139/80 (BP Location: Right Arm)    Pulse 65    Temp 98.3 F (36.8 C) (Oral)    Resp 20    SpO2 98%   Physical Exam Vitals and nursing note reviewed.  Constitutional:      Appearance: He is well-developed.  HENT:     Head: Normocephalic and atraumatic.  Eyes:     Conjunctiva/sclera: Conjunctivae normal.  Cardiovascular:     Rate and Rhythm: Normal rate and regular rhythm.     Heart sounds: Normal heart sounds. No murmur heard.   Pulmonary:     Effort: Pulmonary effort is normal. No respiratory distress.     Breath sounds: Normal breath sounds. No wheezing, rhonchi or rales.  Abdominal:     General: Bowel sounds are normal.     Palpations: Abdomen is soft.     Tenderness: There is no abdominal tenderness. There is no guarding or rebound.  Musculoskeletal:     Cervical back: Neck supple.  Skin:    General: Skin is warm and dry.  Neurological:     Mental Status: He is alert.     ED Results / Procedures / Treatments   Labs (all labs ordered are listed, but only abnormal results are displayed) Labs Reviewed  COMPREHENSIVE METABOLIC PANEL - Abnormal; Notable for the following components:      Result Value   Potassium 3.1 (*)    CO2 18 (*)    Glucose, Bld 194 (*)    Calcium 8.3 (*)    AST 80 (*)     ALT 143 (*)    Anion gap 17 (*)    All other components within normal limits  ETHANOL - Abnormal; Notable for the following components:   Alcohol, Ethyl (B) 39 (*)    All other components within normal limits  CBC - Abnormal; Notable for the following components:   WBC 12.7 (*)    All other components within normal limits  RAPID URINE DRUG SCREEN, HOSP PERFORMED - Abnormal; Notable for the following components:   Opiates POSITIVE (*)    Benzodiazepines POSITIVE (*)    All other  components within normal limits  I-STAT CHEM 8, ED - Abnormal; Notable for the following components:   Glucose, Bld 129 (*)    Calcium, Ion 1.08 (*)    All other components within normal limits  I-STAT VENOUS BLOOD GAS, ED - Abnormal; Notable for the following components:   pO2, Ven 58.0 (*)    Calcium, Ion 1.12 (*)    All other components within normal limits    EKG None       Radiology No results found.  Procedures Procedures (including critical care time)  Medications Ordered in ED Medications  sodium chloride 0.9 % bolus 1,000 mL (0 mLs Intravenous Stopped 01/04/20 0924)  potassium chloride SA (KLOR-CON) CR tablet 40 mEq (40 mEq Oral Given 01/04/20 2182)    ED Course  I have reviewed the triage vital signs and the nursing notes.  Pertinent labs & imaging results that were available during my care of the patient were reviewed by me and considered in my medical decision making (see chart for details).    MDM Rules/Calculators/A&P                          34 year old male presenting for evaluation of an intentional drug overdose after using oxycodone prior to arrival.  On my evaluation which was 9 hours after his arrival he is alert, oriented and in no acute distress.  He has no complaints at the time of my evaluation.  His exam is normal and his vital signs are within normal limits.  Reviewed/interpreted lab CBC showed a mild leukocytosis, no anemia CMP showed low bicarb, elevated anion  gap, elevated LFTs which appears chronic for him and a normal creatinine.  Also showed mild hypokalemia  - kdur given, EKG with slightly prolonged qtc at 488  - repeat chem 8 after IVF showed improvement of bicarb and closed anion gap VBG w/o acidemia UDS positive for opiates and benzos EtOH marginally positive  Pt continues to be w/o complaints. Labs are improving. Will give potassium for him and have him f/u with pcp for k and lfts recheck. Peer support consulted and pt advised to f/u with his psychiatrist. States he was not intending to harm self today and I have low suspicion that he had the intention to OD today. He will also be send home with rx for narcan. Advised on return precautions. He voices understanding of the plan and reason to return. All questions answered, pt stable for discharge.   Final Clinical Impression(s) / ED Diagnoses Final diagnoses:  Opioid overdose, accidental or unintentional, initial encounter ([place])  Hypokalemia  Elevated liver enzymes    Rx / DC Orders ED Discharge Orders         Ordered    Consult to Peer Support       Provider:  (Not yet assigned)   01/04/20 0714    potassium chloride (KLOR-CON) 10 MEQ tablet  Daily        01/04/20 1006    naloxone (NARCAN) nasal spray 4 mg/0.1 mL        01/04/20 1006           Clella Mckeel S, PA-C 01/04/20 1008    Gareth Morgan, MD 01/07/20 1034

## 2020-01-04 NOTE — Discharge Instructions (Signed)
Your potassium was noted to be low today so you were given a prescription for potassium supplementation.  Your liver enzymes are also somewhat elevated but this appears to be chronic.  You will need to follow-up to have repeat labs with your primary doctor to reassess these abnormalities.  Additionally, please follow-up with your psychiatrist in relation to your drug use.  You were provided with information for outpatient counseling/substance abuse resources.  Please return to the emergency department for any new or worsening symptoms.

## 2020-01-04 NOTE — ED Notes (Signed)
Denies any complaints at present.

## 2021-08-26 IMAGING — DX DG CHEST 1V PORT
1 series · 1 of 1 positions shown · non-contrast
Comparison: None.

CLINICAL DATA: Intubation. Orogastric tube placement.

EXAM:
PORTABLE CHEST 1 VIEW

[chest ap]
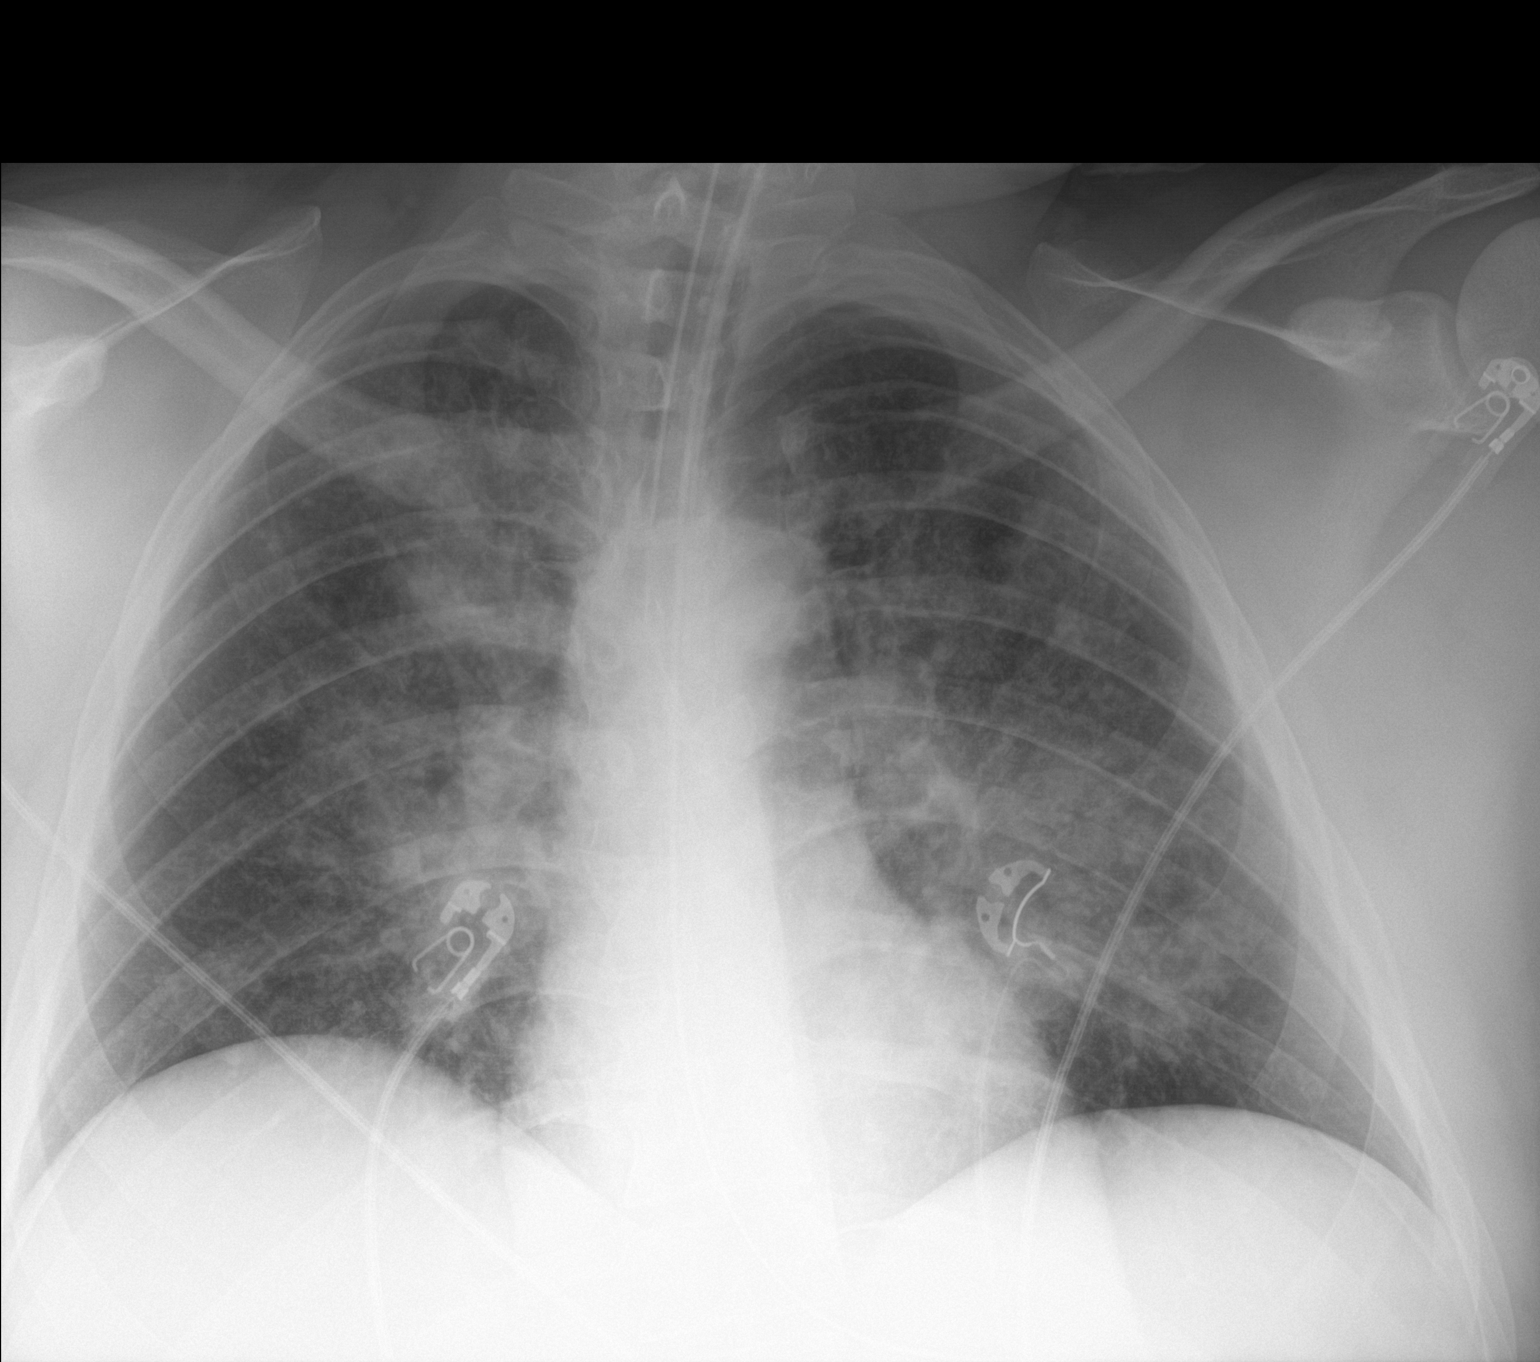

[1 of 1 positions shown; findings below may reference images not displayed]

FINDINGS: Endotracheal tube tip at the thoracic inlet. Enteric tube in place
tip below the diaphragm not included in the field of view. Symmetric
low lung volumes. Confluent bilateral perihilar opacities. Heart is
normal in size. No pleural fluid or pneumothorax. No acute osseous
abnormalities are seen.
IMPRESSION: 1. Endotracheal tube tip at the thoracic inlet. Enteric tube in
place tip below the diaphragm not included in the field of view.
2. Confluent bilateral perihilar opacities, favor
aspiration/pneumonia over pulmonary edema.

## 2022-02-24 ENCOUNTER — Encounter (HOSPITAL_COMMUNITY): Payer: Self-pay

## 2022-02-24 ENCOUNTER — Other Ambulatory Visit: Payer: Self-pay

## 2022-02-24 ENCOUNTER — Emergency Department (HOSPITAL_COMMUNITY)
Admission: EM | Admit: 2022-02-24 | Discharge: 2022-02-24 | Disposition: A | Discharge status: Home / Self Care | Payer: Self-pay | Attending: Emergency Medicine | Admitting: Emergency Medicine

## 2022-02-24 DIAGNOSIS — F32A Depression, unspecified: Secondary | ICD-10-CM | POA: Insufficient documentation

## 2022-02-24 DIAGNOSIS — F419 Anxiety disorder, unspecified: Secondary | ICD-10-CM | POA: Insufficient documentation

## 2022-02-24 NOTE — ED Triage Notes (Signed)
Pt to er, pt states that it has been hard to focus, states that he has had anxiety and depression.  Pt denies any thoughts of si or hi, pt states that he has had anxiety and depression in the past, states that he hasn't seen a counselor for a while. States that he used to take meds, but not at this time.

## 2022-02-24 NOTE — Discharge Instructions (Signed)
You were seen here today for evaluation of your anxiety and depression. You have refused labs. I would like for you to follow up with the [facility] after discharge from here. The information is included in your discharge paperwork. This is open 24 hours a day. If you have any concerns, new or worsening symptoms, please return to the ER for re-evaluation.   Contact a health care provider if: You have a hard time staying focused or finishing daily tasks. You spend many hours a day feeling worried about everyday life. You become exhausted by worry. You start to have headaches or frequently feel tense. You develop chronic nausea or diarrhea. Get help right away if: You have a racing heart and shortness of breath. You have thoughts of hurting yourself or others. If you ever feel like you may hurt yourself or others, or have thoughts about taking your own life, get help right away. Go to your nearest emergency department or: Call your local emergency services (911 in the [place] Call a suicide crisis helpline, such as the [facility] at 205 408 4713 or 988 in the [place]. This is open 24 hours a day in the [place] Text the Crisis Text Line at 9780549588 (in the [place].).

## 2022-02-24 NOTE — ED Provider Notes (Signed)
[facility] DEPT Provider Note   CSN: [id number] Arrival date & time: 02/24/22  5929     History Chief Complaint  Patient presents with   Anxiety    Perry Li is a 36 y.o. male with history of substance and alcohol use presents the emergency room and for evaluation of anxiety and depression has been worsening for the past few months.  He arrives with his sister who reports that he has still been using substances occasionally, but will quit.  She reports that he is generally depressed and laying around the house not really wanting to do much.  The patient reports that he used to be on clonazepam and sertraline for his anxiety and depression however stopped taking these.  He does not know why he stopped taking them, but reports that he has attempted to try to get back on them.  He currently denies any suicidal ideation, wanting to self-harm, homicidal ideations, visual hallucinations, or auditory hallucinations.  He denies any recent illnesses.  Denies any chest pain, shortness of breath, abdominal pain, nausea, or vomiting.   Anxiety       Home Medications Prior to Admission medications   Medication Sig Start Date End Date Taking? Authorizing Provider  blood glucose meter kit and supplies KIT Dispense based on patient and insurance preference. Use up to four times daily as directed. (FOR ICD-9 250.00, 250.01). 05/14/18   Kayleen Memos, DO  blood glucose meter kit and supplies Dispense based on patient and insurance preference. Use up to twice daily as directed. (FOR ICD-10 E10.9, E11.9). 05/11/18   Copland, Gay Filler, MD  clonazePAM (KLONOPIN) 1 MG tablet Take 0.85m am,0.5 mg at noon and 1 mg at bedtime.  Last rx from this provider Patient taking differently: Take 0.5-1 mg by mouth See admin instructions. Take 174mam,0.5 mg at noon and 1 mg at bedtime. 07/03/19   Copland, JeGay FillerMD  FLUoxetine (PROZAC) 20 MG capsule Take 20 mg by mouth daily.  12/04/19   [provider]  hydroxypropyl methylcellulose / hypromellose (ISOPTO TEARS / GONIOVISC) 2.5 % ophthalmic solution Place 1 drop into both eyes as needed for dry eyes.    [provider]  metFORMIN (GLUCOPHAGE XR) 500 MG 24 hr tablet Take 1 tablet (500 mg total) by mouth daily with breakfast. Patient taking differently: Take 500 mg by mouth every other day.  04/16/19   Copland, JeGay FillerMD  naloxone (NTuscarawas [facility] Surgery [facility] LLCnasal spray 4 mg/0.1 mL Spray into the nostril in the case of an overdose 01/04/20   Couture, Cortni S, PA-C  potassium chloride (KLOR-CON) 10 MEQ tablet Take 1 tablet (10 mEq total) by mouth daily for 5 days. 01/04/20 01/09/20  Couture, Cortni S, PA-C  sertraline (ZOLOFT) 50 MG tablet Take 1 tablet (50 mg total) by mouth daily. Patient not taking: Reported on 01/04/2020 04/30/19   DaEdwin DadaMD  traZODone (DESYREL) 150 MG tablet Take 75 mg by mouth at bedtime. 01/01/20   [provider]      Allergies    Patient has no known allergies.    Review of Systems   Review of Systems  Psychiatric/Behavioral:  Positive for dysphoric mood. Negative for self-injury and suicidal ideas. The patient is nervous/anxious.     Physical Exam Updated Vital Signs BP (!) 150/105   Pulse 69   Temp 97.7 F (36.5 C) (Oral)   Resp 18   Ht '5\' 11"'  (1.803 m)   Wt 90.7 kg  SpO2 100%   BMI 27.89 kg/m  Physical Exam Vitals and nursing note reviewed.  Constitutional:      General: He is not in acute distress.    Appearance: He is not ill-appearing or toxic-appearing.  Eyes:     General: No scleral icterus. Pulmonary:     Effort: Pulmonary effort is normal. No respiratory distress.  Skin:    General: Skin is dry.     Findings: No rash.  Neurological:     General: No focal deficit present.     Mental Status: He is alert. Mental status is at baseline.  Psychiatric:        Mood and Affect: Affect is flat.        Speech: Speech normal.        Thought Content:  Thought content does not include homicidal or suicidal ideation. Thought content does not include homicidal or suicidal plan.     Comments: Flat affect.  Denies any homicidal or suicidal ideations or plans.     ED Results / Procedures / Treatments   Labs (all labs ordered are listed, but only abnormal results are displayed) Labs Reviewed  RESP PANEL BY RT-PCR (FLU A&B, COVID) [id number]  COMPREHENSIVE METABOLIC PANEL  ETHANOL  RAPID URINE DRUG SCREEN, HOSP PERFORMED  CBC WITH DIFFERENTIAL/PLATELET    EKG None  Radiology No results found.  Procedures Procedures   Medications Ordered in ED Medications - No data to display  ED Course/ Medical Decision Making/ A&P                           Medical Decision Making Amount and/or Complexity of Data Reviewed Labs: ordered.   36 year old male presents emerged department for evaluation of his anxiety and depression worsening fast events.  Differential diagnosis includes but is not limited to anxiety, depression, or abnormality, thyroid.  Vital signs are unremarkable.  Patient afebrile, normal pulse rate, satting well on room air without increased work of breathing.  Normotensive.  Physical exam as noted above.  I evaluated the patient and discussed the need for labs and blood work.  Discussed that we need to rule out any other cause of his anxiety and depression, also mentioned that he needs to be medically clear in order to be assessed by the behavioral health urgent care.  Patient declines any blood work reports that that is "not for me".  His sister and father discussed with him as separate x2 try to get him to do blood work.  Patient reports that he would like to be helped, but however does not want any blood work at this time.  Again, he mentioned the importance of getting this blood work.  He declines.  I will given the information for the behavioral health urgent care center, advised them to go there after discharge from the ER.The  patient is not conveying any active suicidal ideations or plan or any homicidal ideations or plans.  We discussed return precautions red flag symptoms.  Patient verbalizes understanding agrees to the plan.  Patient is stable being discharged home in good condition.  I discussed this case with my attending physician who cosigned this note including patient's presenting symptoms, physical exam, and planned diagnostics and interventions. Attending physician stated agreement with plan or made changes to plan which were implemented.   Final Clinical Impression(s) / ED Diagnoses Final diagnoses:  Anxiety  Depression, unspecified depression type    Rx / DC Orders ED Discharge  Orders     None         Sherrell Puller, [place] 02/24/22 1313    Lajean Saver, MD 02/24/22 1335

## 2022-02-24 NOTE — ED Notes (Signed)
Pt sitting in chair, sister at bedside, pt states that he doesn't want lab work and wants to go home, states that he talked about it with the PA and is ready to go, pt verbalized understanding d/c and follow up, pt from dpt with family

## 2022-03-01 ENCOUNTER — Encounter (HOSPITAL_COMMUNITY): Payer: Self-pay

## 2022-03-01 ENCOUNTER — Ambulatory Visit (HOSPITAL_COMMUNITY): Payer: Self-pay | Admitting: Psychiatry

## 2022-04-06 ENCOUNTER — Emergency Department (HOSPITAL_COMMUNITY)
Admission: EM | Admit: 2022-04-06 | Discharge: 2022-04-06 | Disposition: A | Discharge status: Home / Self Care | Payer: Self-pay | Attending: Emergency Medicine | Admitting: Emergency Medicine

## 2022-04-06 ENCOUNTER — Other Ambulatory Visit: Payer: Self-pay

## 2022-04-06 DIAGNOSIS — G2402 Drug induced acute dystonia: Secondary | ICD-10-CM | POA: Insufficient documentation

## 2022-04-06 MED ORDER — OLANZAPINE 5 MG PO TABS: 5.0000 mg | ORAL_TABLET | Freq: Every day | ORAL | 0 refills | Status: DC

## 2022-04-06 MED ORDER — OLANZAPINE 5 MG PO TABS: 5.0000 mg | ORAL_TABLET | Freq: Every day | ORAL | 0 refills | Status: AC

## 2022-04-06 MED ORDER — LORAZEPAM 1 MG PO TABS
1.0000 mg | ORAL_TABLET | Freq: Once | ORAL | Status: AC
  Administered 2022-04-06: 1 mg via ORAL
  Filled 2022-04-06: qty 1

## 2022-04-06 NOTE — Discharge Instructions (Signed)
STOP taking the Haldol immediately. You should also STOP taking the COGENTIN. If you have recurrence of aggitation or spasm (dystonia) take 25-50 mg of benadryl by mouth ever 6 hours as needed. Contact a health care provider if: Your original symptoms return after treatment. Get help right away if: You develop uncontrollable muscle movements after starting a new medicine. You can be given medicines to relax the muscles in the emergency department.

## 2022-04-06 NOTE — ED Triage Notes (Signed)
Pt arrives via [facility] from home. Having stiffness in limbs, face, neck. Just started haldol 2 weeks. Extrapyramidal reaction last week, given benadryl for the same. Per provider, he took 1/2 dose today, experiencing the same symptoms. En route he was given 50 mg benadryl in 18 in the left ac. Symptoms are improved. VSS

## 2022-04-06 NOTE — ED Provider Notes (Signed)
[facility] DEPT Provider Note   CSN: [id number] Arrival date & time: 04/06/22  2012     History  Chief Complaint  Patient presents with   Medication Reaction    Perry Li is a 36 y.o. male is brought in by EMS after having dystonic reaction to his Haldol.  He has a past medical history of opioid and alcohol abuse.  He was IVC by his father about 2 weeks ago and was brought into the emergency department after exhibiting bizarre behavior and psychosis.  Patient was discharged after behavioral health admission with Haldol and Cogentin.  He has had a dystonic reaction already.  He saw a psychiatrist about 2 to 3 days ago told her to just take half the tablet or quarter of the tablet.  This evening the patient states that his arm jaw and face locked up and he was unable to move.  EMS reports that he appeared to be in dystonic reaction he was given IV Benadryl with complete resolution of his symptoms prior to arrival.  Patient reports that the Haldol makes him feel like he has constant severe restlessness and akathisia despite taking the Cogentin.  He has no chest pain shortness of breath or other concerns at this time  HPI     Home Medications Prior to Admission medications   Medication Sig Start Date End Date Taking? Authorizing Provider  blood glucose meter kit and supplies KIT Dispense based on patient and insurance preference. Use up to four times daily as directed. (FOR ICD-9 250.00, 250.01). 05/14/18   Kayleen Memos, DO  blood glucose meter kit and supplies Dispense based on patient and insurance preference. Use up to twice daily as directed. (FOR ICD-10 E10.9, E11.9). 05/11/18   Copland, Gay Filler, MD  clonazePAM (KLONOPIN) 1 MG tablet Take 0.62m am,0.5 mg at noon and 1 mg at bedtime.  Last rx from this provider Patient taking differently: Take 0.5-1 mg by mouth See admin instructions. Take 121mam,0.5 mg at noon and 1 mg at bedtime. 07/03/19   Copland,  JeGay FillerMD  FLUoxetine (PROZAC) 20 MG capsule Take 20 mg by mouth daily. 12/04/19   [provider]  hydroxypropyl methylcellulose / hypromellose (ISOPTO TEARS / GONIOVISC) 2.5 % ophthalmic solution Place 1 drop into both eyes as needed for dry eyes.    [provider]  metFORMIN (GLUCOPHAGE XR) 500 MG 24 hr tablet Take 1 tablet (500 mg total) by mouth daily with breakfast. Patient taking differently: Take 500 mg by mouth every other day.  04/16/19   Copland, JeGay FillerMD  naloxone ([facility] spray 4 mg/0.1 mL Spray into the nostril in the case of an overdose 01/04/20   Couture, Cortni S, PA-C  potassium chloride (KLOR-CON) 10 MEQ tablet Take 1 tablet (10 mEq total) by mouth daily for 5 days. 01/04/20 01/09/20  Couture, Cortni S, PA-C  sertraline (ZOLOFT) 50 MG tablet Take 1 tablet (50 mg total) by mouth daily. Patient not taking: Reported on 01/04/2020 04/30/19   DaEdwin DadaMD  traZODone (DESYREL) 150 MG tablet Take 75 mg by mouth at bedtime. 01/01/20   [provider]      Allergies    Patient has no known allergies.    Review of Systems   Review of Systems  Physical Exam Updated Vital Signs BP 137/84 (BP Location: Right Arm)   Pulse 82   Temp 98.9 F (37.2 C) (Oral)   Resp 18   SpO2  98%  Physical Exam Vitals and nursing note reviewed.  Constitutional:      General: He is not in acute distress.    Appearance: He is well-developed. He is not diaphoretic.  HENT:     Head: Normocephalic and atraumatic.  Eyes:     General: No scleral icterus.    Conjunctiva/sclera: Conjunctivae normal.  Cardiovascular:     Rate and Rhythm: Normal rate and regular rhythm.     Heart sounds: Normal heart sounds.  Pulmonary:     Effort: Pulmonary effort is normal. No respiratory distress.     Breath sounds: Normal breath sounds.  Abdominal:     Palpations: Abdomen is soft.     Tenderness: There is no abdominal tenderness.  Musculoskeletal:     Cervical  back: Normal range of motion and neck supple.  Skin:    General: Skin is warm and dry.  Neurological:     Mental Status: He is alert.  Psychiatric:        Behavior: Behavior normal.     ED Results / Procedures / Treatments   Labs (all labs ordered are listed, but only abnormal results are displayed) Labs Reviewed - No data to display  EKG None  Radiology No results found.  Procedures Procedures    Medications Ordered in ED Medications - No data to display  ED Course/ Medical Decision Making/ A&P                           Medical Decision Making Amount and/or Complexity of Data Reviewed ECG/medicine tests: ordered.  Risk Prescription drug management.    Patient here with dystonic reaction on Haldol.  He was given IV Benadryl prior to arrival with resolution of his dystonia patient continued to have some akathisia.  He had this improved after Ativan.  Given the fact that he has had now 2 dystonic reactions since beginning this medication 2 weeks ago I think it is fair that he should discontinue using Haldol.  I have ordered 5 mg Zyprexa.  He has an upcoming reevaluation with his psychiatrist In 2 days.  Patient also advised to discontinue Cogentin.  He appears otherwise appropriate for discharge.  His parents are at bedside and have discussed all the findings.  Discussed return precautions.       Final Clinical Impression(s) / ED Diagnoses Final diagnoses:  Acute dystonic reaction due to drugs    Rx / DC Orders ED Discharge Orders     None         Margarita Mail, PA-C 04/06/22 2319    Carmin Muskrat, MD 04/07/22 [phone]

## 2022-04-06 NOTE — ED Provider Triage Note (Signed)
Emergency Medicine Provider Triage Evaluation Note  Esaias Cleavenger , a 36 y.o. male  was evaluated in triage.  Pt complains of dystonic reaction. Given benadryl -resolved.  Review of Systems  Positive: Med rxn Negative: fever  Physical Exam  BP 137/84 (BP Location: Right Arm)   Pulse 82   Temp 98.9 F (37.2 C) (Oral)   Resp 18   SpO2 98%  Gen:   Awake, no distress   Resp:  Normal effort  MSK:   Moves extremities without difficulty  Other:    Medical Decision Making  Medically screening exam initiated at 8:31 PM.  Appropriate orders placed.  Sylvanus Steil was informed that the remainder of the evaluation will be completed by another provider, this initial triage assessment does not replace that evaluation, and the importance of remaining in the ED until their evaluation is complete.     Arthor Captain, PA-C 04/06/22 2033

## 2022-04-27 ENCOUNTER — Other Ambulatory Visit: Payer: Self-pay

## 2022-04-27 ENCOUNTER — Encounter (HOSPITAL_BASED_OUTPATIENT_CLINIC_OR_DEPARTMENT_OTHER): Payer: Self-pay

## 2022-04-27 DIAGNOSIS — R0602 Shortness of breath: Secondary | ICD-10-CM | POA: Insufficient documentation

## 2022-04-27 DIAGNOSIS — R0789 Other chest pain: Secondary | ICD-10-CM | POA: Diagnosis present

## 2022-04-27 DIAGNOSIS — E119 Type 2 diabetes mellitus without complications: Secondary | ICD-10-CM | POA: Insufficient documentation

## 2022-04-27 DIAGNOSIS — Z7984 Long term (current) use of oral hypoglycemic drugs: Secondary | ICD-10-CM | POA: Insufficient documentation

## 2022-04-27 NOTE — ED Triage Notes (Signed)
Pt with acute CP for the last 15 minutes. Pt denies hx of acid reflux; pt states he ate greasy food and unsure if that's what is causing his pain. Denies heart problems. Pt also endorses dizziness.

## 2022-04-28 ENCOUNTER — Encounter (HOSPITAL_BASED_OUTPATIENT_CLINIC_OR_DEPARTMENT_OTHER): Payer: Self-pay | Admitting: Emergency Medicine

## 2022-04-28 ENCOUNTER — Emergency Department (HOSPITAL_BASED_OUTPATIENT_CLINIC_OR_DEPARTMENT_OTHER): Payer: Medicaid Other

## 2022-04-28 ENCOUNTER — Other Ambulatory Visit: Payer: Self-pay

## 2022-04-28 ENCOUNTER — Emergency Department (HOSPITAL_BASED_OUTPATIENT_CLINIC_OR_DEPARTMENT_OTHER)
Admission: EM | Admit: 2022-04-28 | Discharge: 2022-04-28 | Disposition: A | Discharge status: Home / Self Care | Payer: Medicaid Other | Attending: Emergency Medicine | Admitting: Emergency Medicine

## 2022-04-28 DIAGNOSIS — R0789 Other chest pain: Secondary | ICD-10-CM

## 2022-04-28 HISTORY — DX: Type 2 diabetes mellitus without complications: E11.9

## 2022-04-28 LAB — CBC WITH DIFFERENTIAL/PLATELET
Abs Immature Granulocytes: 0.04 10*3/uL (ref 0.00–0.07)
Basophils Absolute: 0 10*3/uL (ref 0.0–0.1)
Basophils Relative: 0 %
Eosinophils Absolute: 0.4 10*3/uL (ref 0.0–0.5)
Eosinophils Relative: 6 %
HCT: 43.8 % (ref 39.0–52.0)
Hemoglobin: 14.6 g/dL (ref 13.0–17.0)
Immature Granulocytes: 1 %
Lymphocytes Relative: 41 %
Lymphs Abs: 3.1 10*3/uL (ref 0.7–4.0)
MCH: 30.7 pg (ref 26.0–34.0)
MCHC: 33.3 g/dL (ref 30.0–36.0)
MCV: 92.2 fL (ref 80.0–100.0)
Monocytes Absolute: 0.4 10*3/uL (ref 0.1–1.0)
Monocytes Relative: 6 %
Neutro Abs: 3.5 10*3/uL (ref 1.7–7.7)
Neutrophils Relative %: 46 %
Platelets: 275 10*3/uL (ref 150–400)
RBC: 4.75 MIL/uL (ref 4.22–5.81)
RDW: 12.8 % (ref 11.5–15.5)
WBC: 7.5 10*3/uL (ref 4.0–10.5)
nRBC: 0 % (ref 0.0–0.2)

## 2022-04-28 LAB — BASIC METABOLIC PANEL
Anion gap: 8 (ref 5–15)
BUN: 21 mg/dL — ABNORMAL HIGH (ref 6–20)
CO2: 25 mmol/L (ref 22–32)
Calcium: 9.3 mg/dL (ref 8.9–10.3)
Chloride: 105 mmol/L (ref 98–111)
Creatinine, Ser: 0.93 mg/dL (ref 0.61–1.24)
GFR, Estimated: >60 mL/min (ref >60)
Glucose, Bld: 116 mg/dL — ABNORMAL HIGH (ref 70–99)
Potassium: 3.6 mmol/L (ref 3.5–5.1)
Sodium: 138 mmol/L (ref 135–145)

## 2022-04-28 LAB — TROPONIN I (HIGH SENSITIVITY)
Troponin I (High Sensitivity): <2 ng/L (ref <18)
Troponin I (High Sensitivity): <2 ng/L (ref <18)

## 2022-04-28 MED ORDER — SUCRALFATE 1 GM/10ML PO SUSP
1.0000 g | Freq: Once | ORAL | Status: AC
  Administered 2022-04-28: 1 g via ORAL
  Filled 2022-04-28: qty 10

## 2022-04-28 MED ORDER — PANTOPRAZOLE SODIUM 40 MG IV SOLR
40.0000 mg | Freq: Once | INTRAVENOUS | Status: AC
  Administered 2022-04-28: 40 mg via INTRAVENOUS
  Filled 2022-04-28: qty 10

## 2022-04-28 MED ORDER — OMEPRAZOLE 20 MG PO CPDR: 20.0000 mg | DELAYED_RELEASE_CAPSULE | Freq: Every day | ORAL | 0 refills | Status: DC

## 2022-04-28 NOTE — ED Notes (Signed)
RN reviewed discharge instructions w/ pt. Follow up and prescriptions reviewed, pt had no further questions °

## 2022-04-28 NOTE — ED Provider Notes (Signed)
[facility] Provider Note: Georgena Spurling, MD, FACEP  CSN: [id number] MRN: [id number] ARRIVAL: 04/27/22 at 2348 ROOM: [place]  Chest Pain   HISTORY OF PRESENT ILLNESS  04/28/22 1:33 AM Perry Li is a 36 y.o. male who has had left-sided chest tightness after eating for the past several days.  The last episode occurred about 15 minutes prior to arrival.  He is somewhat short of breath with this but denies any associated nausea, vomiting or diaphoresis.  He rated the discomfort as a 7 out of 10 on arrival but it has subsequently improved.   Past Medical History:  Diagnosis Date   Anxiety    Chest pain    DM (diabetes mellitus) ([place])    High cholesterol    Insomnia    SOB (shortness of breath)     Past Surgical History:  Procedure Laterality Date   NO PAST SURGERIES      Family History  Problem Relation Age of Onset   Healthy Mother    Hypertension Father    Diabetes Maternal Grandmother     Social History   Tobacco Use   Smoking status: Never   Smokeless tobacco: Never  Vaping Use   Vaping Use: Some days  Substance Use Topics   Alcohol use: Yes    Alcohol/week: 0.0 standard drinks of alcohol    Comment: occasional   Drug use: No    Prior to Admission medications   Medication Sig Start Date End Date Taking? Authorizing Provider  omeprazole (PRILOSEC) 20 MG capsule Take 1 capsule (20 mg total) by mouth daily. 04/28/22  Yes Nesa Distel, MD  blood glucose meter kit and supplies KIT Dispense based on patient and insurance preference. Use up to four times daily as directed. (FOR ICD-9 250.00, 250.01). 05/14/18   Kayleen Memos, DO  blood glucose meter kit and supplies Dispense based on patient and insurance preference. Use up to twice daily as directed. (FOR ICD-10 E10.9, E11.9). 05/11/18   Copland, Gay Filler, MD  clonazePAM (KLONOPIN) 1 MG tablet Take 0.81m am,0.5 mg at noon and 1 mg at bedtime.  Last rx from this  provider Patient taking differently: Take 0.5-1 mg by mouth See admin instructions. Take 182mam,0.5 mg at noon and 1 mg at bedtime. 07/03/19   Copland, JeGay FillerMD  FLUoxetine (PROZAC) 20 MG capsule Take 20 mg by mouth daily. 12/04/19   [provider]  hydroxypropyl methylcellulose / hypromellose (ISOPTO TEARS / GONIOVISC) 2.5 % ophthalmic solution Place 1 drop into both eyes as needed for dry eyes.    [provider]  metFORMIN (GLUCOPHAGE XR) 500 MG 24 hr tablet Take 1 tablet (500 mg total) by mouth daily with breakfast. Patient taking differently: Take 500 mg by mouth every other day.  04/16/19   Copland, JeGay FillerMD  naloxone (NMt [facility] spray 4 mg/0.1 mL Spray into the nostril in the case of an overdose 01/04/20   Couture, Cortni S, PA-C  OLANZapine (ZYPREXA) 5 MG tablet Take 1 tablet (5 mg total) by mouth at bedtime. 04/06/22   Harris, AbVernie ShanksPA-C  potassium chloride (KLOR-CON) 10 MEQ tablet Take 1 tablet (10 mEq total) by mouth daily for 5 days. 01/04/20 01/09/20  Couture, Cortni S, PA-C  traZODone (DESYREL) 150 MG tablet Take 75 mg by mouth at bedtime. 01/01/20   [provider]    Allergies Patient has no known allergies.   REVIEW OF SYSTEMS  Negative except as  noted here or in the History of Present Illness.   PHYSICAL EXAMINATION  Initial Vital Signs Blood pressure 108/65, pulse 66, temperature 98.1 F (36.7 C), temperature source Oral, resp. rate 14, height 6' (1.829 m), weight 98 kg, SpO2 99 %.  Examination General: Well-developed, well-nourished male in no acute distress; appearance consistent with age of record HENT: normocephalic; atraumatic Eyes: Normal appearance Neck: supple Heart: regular rate and rhythm Lungs: clear to auscultation bilaterally Chest: Left-sided chest tenderness Abdomen: soft; nondistended; nontender; bowel sounds present Extremities: No deformity; full range of motion; pulses normal; prominent varicosities of left lower  leg Neurologic: Awake, alert and oriented; motor function intact in all extremities and symmetric; no facial droop Skin: Warm and dry Psychiatric: Normal mood and affect   RESULTS  Summary of this visit's results, reviewed and interpreted by myself:   EKG Interpretation  Date/Time:    Ventricular Rate:    PR Interval:    QRS Duration:   QT Interval:    QTC Calculation:   R Axis:     Text Interpretation:         Laboratory Studies: Results for orders placed or performed during the hospital encounter of 04/28/22 (from the past 24 hour(s))  Troponin I (High Sensitivity)     Status: None   Collection Time: 04/28/22  2:29 AM  Result Value Ref Range   Troponin I (High Sensitivity) <2 <18 ng/L   Imaging Studies: DG Chest 2 View  Result Date: 04/28/2022 CLINICAL DATA:  Acute chest pain EXAM: CHEST - 2 VIEW COMPARISON:  04/28/2019 FINDINGS: The heart size and mediastinal contours are within normal limits. Both lungs are clear. The visualized skeletal structures are unremarkable. IMPRESSION: No active cardiopulmonary disease. Electronically Signed   By: Placido Sou M.D.   On: 04/28/2022 00:19    ED COURSE and MDM  Nursing notes, initial and subsequent vitals signs, including pulse oximetry, reviewed and interpreted by myself.  Vitals:   04/28/22 0200 04/28/22 0215 04/28/22 0230 04/28/22 0300  BP: 110/68 105/72 116/73 107/75  Pulse: (!) 58 71 64 (!) 57  Resp: 18 14 (!) 21 15  Temp:      TempSrc:      SpO2: 99% 100% 99% 100%  Weight:      Height:       Medications  pantoprazole (PROTONIX) injection 40 mg (40 mg Intravenous Given 04/28/22 0200)  sucralfate (CARAFATE) 1 GM/10ML suspension 1 g (1 g Oral Given 04/28/22 0151)   1:41 AM Diagnostic studies (except for chest x-ray) are not crossing into this document from Epic.  His EKG is normal and his initial troponin is less than 2.  His Bement is normal except for a slightly elevated BUN of 21 and a glucose of 116.  His  CBC is normal.  3:11 AM Repeat troponin less than 2.  His chest discomfort has improved some after oral Carafate and IV Protonix.  We will start him on a PPI for possible acid reflux.  His presentation is atypical for cardiac etiology and his HEART score is 1 (low risk).    PROCEDURES  Procedures   ED DIAGNOSES     ICD-10-CM   1. Atypical chest pain  R07.89          Shanon Rosser, MD 04/28/22 [phone]

## 2022-05-22 ENCOUNTER — Emergency Department (HOSPITAL_BASED_OUTPATIENT_CLINIC_OR_DEPARTMENT_OTHER)
Admission: EM | Admit: 2022-05-22 | Discharge: 2022-05-22 | Disposition: A | Discharge status: Home / Self Care | Payer: Medicaid Other | Attending: Emergency Medicine | Admitting: Emergency Medicine

## 2022-05-22 ENCOUNTER — Emergency Department (HOSPITAL_BASED_OUTPATIENT_CLINIC_OR_DEPARTMENT_OTHER): Payer: Medicaid Other

## 2022-05-22 ENCOUNTER — Other Ambulatory Visit: Payer: Self-pay

## 2022-05-22 DIAGNOSIS — R079 Chest pain, unspecified: Secondary | ICD-10-CM | POA: Diagnosis present

## 2022-05-22 DIAGNOSIS — R0789 Other chest pain: Secondary | ICD-10-CM | POA: Diagnosis not present

## 2022-05-22 LAB — BASIC METABOLIC PANEL
Anion gap: 9 (ref 5–15)
BUN: 18 mg/dL (ref 6–20)
CO2: 24 mmol/L (ref 22–32)
Calcium: 8.6 mg/dL — ABNORMAL LOW (ref 8.9–10.3)
Chloride: 105 mmol/L (ref 98–111)
Creatinine, Ser: 0.96 mg/dL (ref 0.61–1.24)
GFR, Estimated: >60 mL/min (ref >60)
Glucose, Bld: 101 mg/dL — ABNORMAL HIGH (ref 70–99)
Potassium: 3.7 mmol/L (ref 3.5–5.1)
Sodium: 138 mmol/L (ref 135–145)

## 2022-05-22 LAB — CBC
HCT: 42.5 % (ref 39.0–52.0)
Hemoglobin: 14.1 g/dL (ref 13.0–17.0)
MCH: 30.5 pg (ref 26.0–34.0)
MCHC: 33.2 g/dL (ref 30.0–36.0)
MCV: 91.8 fL (ref 80.0–100.0)
Platelets: 171 10*3/uL (ref 150–400)
RBC: 4.63 MIL/uL (ref 4.22–5.81)
RDW: 12.4 % (ref 11.5–15.5)
WBC: 6.9 10*3/uL (ref 4.0–10.5)
nRBC: 0 % (ref 0.0–0.2)

## 2022-05-22 LAB — TROPONIN I (HIGH SENSITIVITY): Troponin I (High Sensitivity): <2 ng/L (ref <18)

## 2022-05-22 MED ORDER — MELOXICAM 7.5 MG PO TABS: 7.5000 mg | ORAL_TABLET | Freq: Every day | ORAL | 0 refills | Status: AC

## 2022-05-22 NOTE — ED Provider Notes (Signed)
[facility] Provider Note   CSN: [id number] Arrival date & time: 05/22/22  0006     History  Chief Complaint  Patient presents with   Chest Pain    Perry Li is a 37 y.o. male.  37 year old male the presents the ER today secondary to chest pain.  Patient is not a great historian states he has had this multiple times in the past including last week where they thought might be reflux but is not really related to eating.  He states he feels some cramping in his left pectoral sometimes.  Worse with movement sometimes.  Not always there.  Happy get today's.  Returns here for recheck.  No shortness of breath.  Sometimes it is worse with taking a deep breath or coughing though.  No trauma.  No rash.   Chest Pain      Home Medications Prior to Admission medications   Medication Sig Start Date End Date Taking? Authorizing Provider  meloxicam (MOBIC) 7.5 MG tablet Take 1 tablet (7.5 mg total) by mouth daily for 7 days. 05/22/22 05/29/22 Yes Jeffery Bachmeier, Corene Cornea, MD  blood glucose meter kit and supplies KIT Dispense based on patient and insurance preference. Use up to four times daily as directed. (FOR ICD-9 250.00, 250.01). 05/14/18   Kayleen Memos, DO  blood glucose meter kit and supplies Dispense based on patient and insurance preference. Use up to twice daily as directed. (FOR ICD-10 E10.9, E11.9). 05/11/18   Copland, Gay Filler, MD  clonazePAM (KLONOPIN) 1 MG tablet Take 0.5mg  am,0.5 mg at noon and 1 mg at bedtime.  Last rx from this provider Patient taking differently: Take 0.5-1 mg by mouth See admin instructions. Take 1mg  am,0.5 mg at noon and 1 mg at bedtime. 07/03/19   Copland, Gay Filler, MD  FLUoxetine (PROZAC) 20 MG capsule Take 20 mg by mouth daily. 12/04/19   [provider]  hydroxypropyl methylcellulose / hypromellose (ISOPTO TEARS / GONIOVISC) 2.5 % ophthalmic solution Place 1 drop into both eyes as needed for dry eyes.     [provider]  metFORMIN (GLUCOPHAGE XR) 500 MG 24 hr tablet Take 1 tablet (500 mg total) by mouth daily with breakfast. Patient taking differently: Take 500 mg by mouth every other day.  04/16/19   Copland, Gay Filler, MD  naloxone [facility]) nasal spray 4 mg/0.1 mL Spray into the nostril in the case of an overdose 01/04/20   Couture, Cortni S, PA-C  OLANZapine (ZYPREXA) 5 MG tablet Take 1 tablet (5 mg total) by mouth at bedtime. 04/06/22   Margarita Mail, PA-C  omeprazole (PRILOSEC) 20 MG capsule Take 1 capsule (20 mg total) by mouth daily. 04/28/22   Molpus, John, MD  potassium chloride (KLOR-CON) 10 MEQ tablet Take 1 tablet (10 mEq total) by mouth daily for 5 days. 01/04/20 01/09/20  Couture, Cortni S, PA-C  traZODone (DESYREL) 150 MG tablet Take 75 mg by mouth at bedtime. 01/01/20   [provider]      Allergies    Patient has no known allergies.    Review of Systems   Review of Systems  Cardiovascular:  Positive for chest pain.    Physical Exam Updated Vital Signs BP 110/68 (BP Location: Right Arm)   Pulse 80   Temp 98.2 F (36.8 C) (Oral)   Resp 17   SpO2 98%  Physical Exam Vitals and nursing note reviewed.  Constitutional:      Appearance: He is well-developed.  HENT:     Head: Normocephalic and atraumatic.  Cardiovascular:     Rate and Rhythm: Normal rate.  Pulmonary:     Effort: Pulmonary effort is normal. No respiratory distress.  Chest:     Chest wall: Tenderness present.  Abdominal:     General: There is no distension.  Musculoskeletal:        General: Normal range of motion.     Cervical back: Normal range of motion.  Neurological:     Mental Status: He is alert.     ED Results / Procedures / Treatments   Labs (all labs ordered are listed, but only abnormal results are displayed) Labs Reviewed  BASIC METABOLIC PANEL - Abnormal; Notable for the following components:      Result Value   Glucose, Bld 101 (*)    Calcium 8.6 (*)    All other  components within normal limits  CBC  TROPONIN I (HIGH SENSITIVITY)    EKG EKG Interpretation  Date/Time:  Saturday May 22 2022 00:14:55 EST Ventricular Rate:  83 PR Interval:  116 QRS Duration: 92 QT Interval:  376 QTC Calculation: 441 R Axis:   79 Text Interpretation: Normal sinus rhythm Normal ECG When compared with ECG of 28-Apr-2022 01:36, PREVIOUS ECG IS PRESENT Confirmed by Merrily Pew [phone]) on 05/22/2022 1:24:32 AM  Radiology DG Chest 2 View  Result Date: 05/22/2022 CLINICAL DATA:  cp EXAM: CHEST - 2 VIEW COMPARISON:  04/28/2022 FINDINGS: Cardiac silhouette is unremarkable. No pneumothorax or pleural effusion. The lungs are clear. The visualized skeletal structures are unremarkable. IMPRESSION: No acute cardiopulmonary process. Electronically Signed   By: Sammie Bench M.D.   On: 05/22/2022 00:27    Procedures Procedures    Medications Ordered in ED Medications - No data to display  ED Course/ Medical Decision Making/ A&P                             Medical Decision Making Amount and/or Complexity of Data Reviewed Labs: ordered. Radiology: ordered.  Risk Prescription drug management.   Troponin negative.  EKG okay.  Chest x-ray okay.  Tenderness with touch suspect muscular tenderness.  Will prescribe anti-inflammatories and suggested heat pad/lidocaine patches as needed.  Otherwise stable for discharge.  Low suspicion for ACS, PE, pneumothorax, pneumonia based on normal vital signs, presentation, atypical story and normal workup.  Final Clinical Impression(s) / ED Diagnoses Final diagnoses:  Nonspecific chest pain    Rx / DC Orders ED Discharge Orders          Ordered    meloxicam (MOBIC) 7.5 MG tablet  Daily        05/22/22 0141              [employer], Corene Cornea, MD 05/22/22 [phone]

## 2022-05-22 NOTE — ED Triage Notes (Signed)
Pt describes a "muscle spasm" in his chest x 1 week.  Does think it moves down his left arm.  No nausea.

## 2022-10-21 ENCOUNTER — Other Ambulatory Visit: Payer: Self-pay

## 2022-10-21 ENCOUNTER — Emergency Department (HOSPITAL_BASED_OUTPATIENT_CLINIC_OR_DEPARTMENT_OTHER)
Admission: EM | Admit: 2022-10-21 | Discharge: 2022-10-21 | Discharge status: Against Medical Advice | Payer: Medicaid Other | Attending: Emergency Medicine | Admitting: Emergency Medicine

## 2022-10-21 ENCOUNTER — Encounter (HOSPITAL_BASED_OUTPATIENT_CLINIC_OR_DEPARTMENT_OTHER): Payer: Self-pay

## 2022-10-21 DIAGNOSIS — Z5321 Procedure and treatment not carried out due to patient leaving prior to being seen by health care provider: Secondary | ICD-10-CM | POA: Diagnosis not present

## 2022-10-21 DIAGNOSIS — R519 Headache, unspecified: Secondary | ICD-10-CM | POA: Diagnosis present

## 2022-10-21 DIAGNOSIS — R42 Dizziness and giddiness: Secondary | ICD-10-CM | POA: Insufficient documentation

## 2022-10-21 NOTE — ED Triage Notes (Signed)
Pt reports constant headache x 2 weeks, worsening over the last 2 days; associated with lightheadedness. Denies any other symptoms. Hx of migraines.

## 2023-07-09 ENCOUNTER — Emergency Department (HOSPITAL_BASED_OUTPATIENT_CLINIC_OR_DEPARTMENT_OTHER)
Admission: EM | Admit: 2023-07-09 | Discharge: 2023-07-09 | Discharge status: Against Medical Advice | Payer: MEDICAID | Attending: Emergency Medicine | Admitting: Emergency Medicine

## 2023-07-09 ENCOUNTER — Emergency Department (HOSPITAL_BASED_OUTPATIENT_CLINIC_OR_DEPARTMENT_OTHER): Payer: MEDICAID

## 2023-07-09 ENCOUNTER — Other Ambulatory Visit: Payer: Self-pay

## 2023-07-09 ENCOUNTER — Encounter (HOSPITAL_BASED_OUTPATIENT_CLINIC_OR_DEPARTMENT_OTHER): Payer: Self-pay

## 2023-07-09 DIAGNOSIS — E119 Type 2 diabetes mellitus without complications: Secondary | ICD-10-CM | POA: Insufficient documentation

## 2023-07-09 DIAGNOSIS — R079 Chest pain, unspecified: Secondary | ICD-10-CM | POA: Diagnosis present

## 2023-07-09 DIAGNOSIS — Z5321 Procedure and treatment not carried out due to patient leaving prior to being seen by health care provider: Secondary | ICD-10-CM | POA: Diagnosis not present

## 2023-07-09 DIAGNOSIS — R0602 Shortness of breath: Secondary | ICD-10-CM | POA: Diagnosis not present

## 2023-07-09 LAB — RESP PANEL BY RT-PCR (RSV, FLU A&B, COVID)  RVPGX2
Influenza A by PCR: NEGATIVE
Influenza B by PCR: NEGATIVE
Resp Syncytial Virus by PCR: NEGATIVE
SARS Coronavirus 2 by RT PCR: NEGATIVE

## 2023-07-09 LAB — CBC
HCT: 41.5 % (ref 39.0–52.0)
Hemoglobin: 14.1 g/dL (ref 13.0–17.0)
MCH: 29.6 pg (ref 26.0–34.0)
MCHC: 34 g/dL (ref 30.0–36.0)
MCV: 87 fL (ref 80.0–100.0)
Platelets: 262 10*3/uL (ref 150–400)
RBC: 4.77 MIL/uL (ref 4.22–5.81)
RDW: 12 % (ref 11.5–15.5)
WBC: 8 10*3/uL (ref 4.0–10.5)
nRBC: 0 % (ref 0.0–0.2)

## 2023-07-09 LAB — BASIC METABOLIC PANEL
Anion gap: 8 (ref 5–15)
BUN: 14 mg/dL (ref 6–20)
CO2: 26 mmol/L (ref 22–32)
Calcium: 9 mg/dL (ref 8.9–10.3)
Chloride: 102 mmol/L (ref 98–111)
Creatinine, Ser: 0.82 mg/dL (ref 0.61–1.24)
GFR, Estimated: >60 mL/min (ref >60)
Glucose, Bld: 199 mg/dL — ABNORMAL HIGH (ref 70–99)
Potassium: 3.7 mmol/L (ref 3.5–5.1)
Sodium: 136 mmol/L (ref 135–145)

## 2023-07-09 LAB — TROPONIN I (HIGH SENSITIVITY): Troponin I (High Sensitivity): 4 ng/L (ref <18)

## 2023-07-09 NOTE — ED Triage Notes (Signed)
 Pt reports that he has been having pain in his chest. States that he was recently diagnosed with diabetes. States that the chest pain started about an hour ago. States that he is also having some shortness of breath at times.

## 2023-07-10 ENCOUNTER — Other Ambulatory Visit: Payer: Self-pay

## 2023-07-10 ENCOUNTER — Emergency Department (HOSPITAL_BASED_OUTPATIENT_CLINIC_OR_DEPARTMENT_OTHER)
Admission: EM | Admit: 2023-07-10 | Discharge: 2023-07-10 | Disposition: A | Discharge status: Home / Self Care | Payer: MEDICAID | Attending: Emergency Medicine | Admitting: Emergency Medicine

## 2023-07-10 ENCOUNTER — Encounter (HOSPITAL_BASED_OUTPATIENT_CLINIC_OR_DEPARTMENT_OTHER): Payer: Self-pay | Admitting: Urology

## 2023-07-10 DIAGNOSIS — Z79899 Other long term (current) drug therapy: Secondary | ICD-10-CM | POA: Diagnosis not present

## 2023-07-10 DIAGNOSIS — Z7984 Long term (current) use of oral hypoglycemic drugs: Secondary | ICD-10-CM | POA: Diagnosis not present

## 2023-07-10 DIAGNOSIS — E119 Type 2 diabetes mellitus without complications: Secondary | ICD-10-CM | POA: Diagnosis not present

## 2023-07-10 DIAGNOSIS — R0789 Other chest pain: Secondary | ICD-10-CM | POA: Diagnosis present

## 2023-07-10 LAB — CBG MONITORING, ED: Glucose-Capillary: 131 mg/dL — ABNORMAL HIGH (ref 70–99)

## 2023-07-10 MED ORDER — PANTOPRAZOLE SODIUM 20 MG PO TBEC: 20.0000 mg | DELAYED_RELEASE_TABLET | Freq: Every day | ORAL | 0 refills | Status: AC

## 2023-07-10 MED ORDER — FAMOTIDINE 20 MG PO TABS
20.0000 mg | ORAL_TABLET | Freq: Once | ORAL | Status: AC
  Administered 2023-07-10: 20 mg via ORAL
  Filled 2023-07-10: qty 1

## 2023-07-10 MED ORDER — ALUM & MAG HYDROXIDE-SIMETH 200-200-20 MG/5ML PO SUSP
15.0000 mL | Freq: Once | ORAL | Status: AC
  Administered 2023-07-10: 15 mL via ORAL
  Filled 2023-07-10: qty 30

## 2023-07-10 NOTE — ED Triage Notes (Signed)
 Pt states continued chest pain x 3 days  Was seen here yesterday for same but LWBS, pain went away but states it returned 30 min pta  Denies any SOB , Denies N/V  Denies cough

## 2023-07-10 NOTE — ED Provider Notes (Signed)
 [place] [facility] Provider Note   CSN: [id number] Arrival date & time: 07/10/23  1615     History  Chief Complaint  Patient presents with   Chest Pain    Perry Li is a 38 y.o. male.   Chest Pain   38 year old male presents emergency department with complaints of chest pain.  Patient ports chest pain that began around 45 minutes prior to come to the emergency department.  States that he was eating a cookie when the chest pain began.  Was seen in the emergency department yesterday for the same chest pain.  States that he again had this yesterday afternoon after eating a snack.  Had a workup yesterday but left prior to being seen due to prolonged wait time.  Reports his pain on the lower left part of his chest.  Denies radiation of pain.  Denies shortness of breath, cough, abdominal pain, nausea, vomiting.  Denies any personal family history of cardiac issues at young age.  Denies any cocaine use.  Past medical history significant for diabetes mellitus, hypercholesterolemia, anxiety  Home Medications Prior to Admission medications   Medication Sig Start Date End Date Taking? Authorizing Provider  blood glucose meter kit and supplies KIT Dispense based on patient and insurance preference. Use up to four times daily as directed. (FOR ICD-9 250.00, 250.01). 05/14/18   Darlin Drop, DO  blood glucose meter kit and supplies Dispense based on patient and insurance preference. Use up to twice daily as directed. (FOR ICD-10 E10.9, E11.9). 05/11/18   Copland, Gwenlyn Found, MD  clonazePAM (KLONOPIN) 1 MG tablet Take 0.5mg  am,0.5 mg at noon and 1 mg at bedtime.  Last rx from this provider Patient taking differently: Take 0.5-1 mg by mouth See admin instructions. Take 1mg  am,0.5 mg at noon and 1 mg at bedtime. 07/03/19   Copland, Gwenlyn Found, MD  FLUoxetine (PROZAC) 20 MG capsule Take 20 mg by mouth daily. 12/04/19   [provider]  hydroxypropyl  methylcellulose / hypromellose (ISOPTO TEARS / GONIOVISC) 2.5 % ophthalmic solution Place 1 drop into both eyes as needed for dry eyes.    [provider]  metFORMIN (GLUCOPHAGE XR) 500 MG 24 hr tablet Take 1 tablet (500 mg total) by mouth daily with breakfast. Patient taking differently: Take 500 mg by mouth every other day.  04/16/19   Copland, Gwenlyn Found, MD  naloxone [facility]) nasal spray 4 mg/0.1 mL Spray into the nostril in the case of an overdose 01/04/20   Couture, Cortni S, PA-C  OLANZapine (ZYPREXA) 5 MG tablet Take 1 tablet (5 mg total) by mouth at bedtime. 04/06/22   Arthor Captain, PA-C  omeprazole (PRILOSEC) 20 MG capsule Take 1 capsule (20 mg total) by mouth daily. 04/28/22   Molpus, John, MD  potassium chloride (KLOR-CON) 10 MEQ tablet Take 1 tablet (10 mEq total) by mouth daily for 5 days. 01/04/20 01/09/20  Couture, Cortni S, PA-C  traZODone (DESYREL) 150 MG tablet Take 75 mg by mouth at bedtime. 01/01/20   [provider]      Allergies    Patient has no known allergies.    Review of Systems   Review of Systems  Cardiovascular:  Positive for chest pain.  All other systems reviewed and are negative.   Physical Exam Updated Vital Signs BP 111/69 (BP Location: Right Arm)   Pulse 82   Temp 98.1 F (36.7 C) (Oral)   Resp 18   Ht 6' (1.829  m)   Wt 117 kg   SpO2 97%   BMI 34.98 kg/m  Physical Exam Vitals and nursing note reviewed.  Constitutional:      General: He is not in acute distress.    Appearance: He is well-developed.  HENT:     Head: Normocephalic and atraumatic.  Eyes:     Conjunctiva/sclera: Conjunctivae normal.  Cardiovascular:     Rate and Rhythm: Normal rate and regular rhythm.     Heart sounds: No murmur heard. Pulmonary:     Effort: Pulmonary effort is normal. No respiratory distress.     Breath sounds: Normal breath sounds.  Abdominal:     Palpations: Abdomen is soft.     Tenderness: There is no abdominal tenderness. There is no  guarding.  Musculoskeletal:        General: No swelling.     Cervical back: Neck supple.  Skin:    General: Skin is warm and dry.     Capillary Refill: Capillary refill takes less than 2 seconds.  Neurological:     Mental Status: He is alert.  Psychiatric:        Mood and Affect: Mood normal.     ED Results / Procedures / Treatments   Labs (all labs ordered are listed, but only abnormal results are displayed) Labs Reviewed  CBG MONITORING, ED - Abnormal; Notable for the following components:      Result Value   Glucose-Capillary 131 (*)    All other components within normal limits    EKG None  Radiology DG Chest 2 View Result Date: 07/09/2023 CLINICAL DATA:  chest pain EXAM: CHEST - 2 VIEW COMPARISON:  Chest x-ray 05/22/2022 FINDINGS: The heart and mediastinal contours are within normal limits. Low lung volumes. No focal consolidation. No pulmonary edema. No pleural effusion. No pneumothorax. No acute osseous abnormality. IMPRESSION: Low lung volumes with no active cardiopulmonary disease. Electronically Signed   By: Tish Frederickson M.D.   On: 07/09/2023 18:27    Procedures Procedures    Medications Ordered in ED Medications  famotidine (PEPCID) tablet 20 mg (20 mg Oral Given 07/10/23 1641)  alum & mag hydroxide-simeth (MAALOX/MYLANTA) 200-200-20 MG/5ML suspension 15 mL (15 mLs Oral Given 07/10/23 1641)    ED Course/ Medical Decision Making/ A&P                                 Medical Decision Making  This patient presents to the ED for concern of chest pain, this involves an extensive number of treatment options, and is a complaint that carries with it a high risk of complications and morbidity.  The differential diagnosis includes GERD, ACS, PE, pneumothorax, pericarditis/myocarditis/tamponade, other   Co morbidities that complicate the patient evaluation  See HPI   Additional history obtained:  Additional history obtained from EMR External records from outside  source obtained and reviewed including hospital records   Lab Tests:  I Ordered, and personally interpreted labs.  The pertinent results include: CBG of 131   Imaging Studies ordered:  N/a   Cardiac Monitoring: / EKG:  The patient was maintained on a cardiac monitor.  I personally viewed and interpreted the cardiac monitored which showed an underlying rhythm of: Sinus rhythm   Consultations Obtained:  N/a   Problem List / ED Course / Critical interventions / Medication management  Chest pain I ordered medication including Maalox, Pepcid  Reevaluation of the patient after these  medicines showed that the patient improved I have reviewed the patients home medicines and have made adjustments as needed   Social Determinants of Health:  Denies tobacco, licit drug use.   Test / Admission - Considered:  Chest pain Vitals signs within normal range and stable throughout visit. Laboratory studies significant for: See above 38 year old male presents emergency department with complaints of chest pain.  Patient ports chest pain that began around 45 minutes prior to come to the emergency department.  States that he was eating a cookie when the chest pain began.  Was seen in the emergency department yesterday for the same chest pain.  States that he again had this yesterday afternoon after eating a snack.  Had a workup yesterday but left prior to being seen due to prolonged wait time.  Reports his pain on the lower left part of his chest.  Denies radiation of pain.  Denies shortness of breath, cough, abdominal pain, nausea, vomiting.  Denies any personal family history of cardiac issues at young age.  Denies any cocaine use. On exam, lungs clear to auscultation.  No evidence of chest wall tenderness or abdominal tenderness.  Patient's pain seems to be isolated to consumption of food/liquids over the past few days without any exertional worsening of symptoms.  Workup yesterday included  troponin that was negative, CBC/CMP that was unremarkable and chest x-ray that also was normal.  Very low suspicion for ACS.  Patient with Wells PE of 0 and PERC negative; low suspicion for PE.  Patient without chest pain rating to back, pulse deficits, neurodeficits, hypertension; low suspicion for dissection.  Suspect the patient's symptoms likely secondary to GERD versus gastritis.  Recommend lifestyle modifications and place patient on a PPI.  Recommend follow-up with PCP in the outpatient setting for reassessment.  Treatment plan discussed length with patient and he acknowledged understanding was agreeable to said plan.  Patient overall well-appearing, afebrile in no acute distress. Worrisome signs and symptoms were discussed with the patient, and the patient acknowledged understanding to return to the ED if noticed. Patient was stable upon discharge.          Final Clinical Impression(s) / ED Diagnoses Final diagnoses:  Other chest pain    Rx / DC Orders ED Discharge Orders     None         Peter Garter, PA 07/10/23 1648    Rozelle Logan, DO 07/10/23 2103

## 2023-07-10 NOTE — ED Notes (Signed)
 Discharge paperwork reviewed entirely with patient, including follow up care. Pain was under control. The patient received instruction and coaching on their prescriptions, and all follow-up questions were answered.  Pt verbalized understanding as well as all parties involved. No questions or concerns voiced at the time of discharge. No acute distress noted.   Pt ambulated out to PVA without incident or assistance.  Pt advised they will notify their PCP immediately.  The pt was instructed to set up and/or review MyChart for their results; and was informed their Providers all have access to the information as well.

## 2023-07-10 NOTE — Discharge Instructions (Addendum)
 As discussed, suspect that your chest discomfort is likely related to GERD.  Will place you on a medicine to try to help with the symptoms.  He may find Maalox over-the-counter to use as needed.  See information attached to discharge papers regarding lifestyle changes he may perform to decrease likelihood of this happening.  Recommend follow-up with your primary care for reassessment.  Please do not hesitate to return if the worrisome signs and symptoms we discussed become apparent.

## 2023-07-10 NOTE — ED Notes (Signed)
 Pt advised he has not been eating the best and has a burning in his chest. Wants evaluation for same, LWBS yesterday after labs.

## 2023-07-12 ENCOUNTER — Other Ambulatory Visit: Payer: Self-pay

## 2023-07-12 ENCOUNTER — Encounter (HOSPITAL_BASED_OUTPATIENT_CLINIC_OR_DEPARTMENT_OTHER): Payer: Self-pay | Admitting: Emergency Medicine

## 2023-07-12 DIAGNOSIS — R0789 Other chest pain: Secondary | ICD-10-CM | POA: Diagnosis present

## 2023-07-12 DIAGNOSIS — Z79899 Other long term (current) drug therapy: Secondary | ICD-10-CM | POA: Diagnosis not present

## 2023-07-12 DIAGNOSIS — Z7984 Long term (current) use of oral hypoglycemic drugs: Secondary | ICD-10-CM | POA: Diagnosis not present

## 2023-07-12 DIAGNOSIS — E119 Type 2 diabetes mellitus without complications: Secondary | ICD-10-CM | POA: Diagnosis not present

## 2023-07-12 LAB — BASIC METABOLIC PANEL
Anion gap: 10 (ref 5–15)
BUN: 12 mg/dL (ref 6–20)
CO2: 22 mmol/L (ref 22–32)
Calcium: 8.9 mg/dL (ref 8.9–10.3)
Chloride: 102 mmol/L (ref 98–111)
Creatinine, Ser: 0.82 mg/dL (ref 0.61–1.24)
GFR, Estimated: >60 mL/min (ref >60)
Glucose, Bld: 163 mg/dL — ABNORMAL HIGH (ref 70–99)
Potassium: 4 mmol/L (ref 3.5–5.1)
Sodium: 134 mmol/L — ABNORMAL LOW (ref 135–145)

## 2023-07-12 LAB — CBC
HCT: 40.6 % (ref 39.0–52.0)
Hemoglobin: 13.8 g/dL (ref 13.0–17.0)
MCH: 29.9 pg (ref 26.0–34.0)
MCHC: 34 g/dL (ref 30.0–36.0)
MCV: 87.9 fL (ref 80.0–100.0)
Platelets: 258 10*3/uL (ref 150–400)
RBC: 4.62 MIL/uL (ref 4.22–5.81)
RDW: 12.4 % (ref 11.5–15.5)
WBC: 7.3 10*3/uL (ref 4.0–10.5)
nRBC: 0 % (ref 0.0–0.2)

## 2023-07-12 LAB — TROPONIN I (HIGH SENSITIVITY): Troponin I (High Sensitivity): 3 ng/L (ref <18)

## 2023-07-12 NOTE — ED Notes (Signed)
 Pt has returned to registration multiple times since triage assessment to report chest pain. This RN assessed pt and his condition is unchanged. RN informed pt that MD is aware of his complaint and will call him for evaluation as soon as a room is available. Pt verbalized understanding and returned to his seat. NAD noted throughout exchange.

## 2023-07-12 NOTE — ED Triage Notes (Signed)
 Pt c/o left-sided chest pain x 20 mins PTA with radiation to left shoulder. No known injury. Seen multiple times recently for similar complaint. NAD in triage. Pt states he feels slightly SOB.

## 2023-07-13 ENCOUNTER — Emergency Department (HOSPITAL_BASED_OUTPATIENT_CLINIC_OR_DEPARTMENT_OTHER)
Admission: EM | Admit: 2023-07-13 | Discharge: 2023-07-13 | Disposition: A | Discharge status: Home / Self Care | Payer: MEDICAID | Attending: Emergency Medicine | Admitting: Emergency Medicine

## 2023-07-13 DIAGNOSIS — R0789 Other chest pain: Secondary | ICD-10-CM

## 2023-07-13 NOTE — ED Provider Notes (Signed)
 [place] [facility] Provider Note   CSN: [id number] Arrival date & time: 07/12/23  2124     History  Chief Complaint  Patient presents with   Chest Pain    Perry Li is a 38 y.o. male.  Patient is a 38 year old male with history of type 2 diabetes, GERD.  Patient presenting today for evaluation of chest discomfort.  He has been experiencing discomfort to the left side of his chest for the past week.  He has been seen in the ER on several occasions with similar complaints, however workup has been unremarkable.  Patient denies any exertional symptoms.  He denies nausea, shortness of breath, diaphoresis, or radiation to the arm or jaw.  The history is provided by the patient.       Home Medications Prior to Admission medications   Medication Sig Start Date End Date Taking? Authorizing Provider  blood glucose meter kit and supplies KIT Dispense based on patient and insurance preference. Use up to four times daily as directed. (FOR ICD-9 250.00, 250.01). 05/14/18   Darlin Drop, DO  blood glucose meter kit and supplies Dispense based on patient and insurance preference. Use up to twice daily as directed. (FOR ICD-10 E10.9, E11.9). 05/11/18   Copland, Gwenlyn Found, MD  clonazePAM (KLONOPIN) 1 MG tablet Take 0.5mg  am,0.5 mg at noon and 1 mg at bedtime.  Last rx from this provider Patient taking differently: Take 0.5-1 mg by mouth See admin instructions. Take 1mg  am,0.5 mg at noon and 1 mg at bedtime. 07/03/19   Copland, Gwenlyn Found, MD  FLUoxetine (PROZAC) 20 MG capsule Take 20 mg by mouth daily. 12/04/19   [provider]  hydroxypropyl methylcellulose / hypromellose (ISOPTO TEARS / GONIOVISC) 2.5 % ophthalmic solution Place 1 drop into both eyes as needed for dry eyes.    [provider]  metFORMIN (GLUCOPHAGE XR) 500 MG 24 hr tablet Take 1 tablet (500 mg total) by mouth daily with breakfast. Patient taking differently: Take 500 mg  by mouth every other day.  04/16/19   Copland, Gwenlyn Found, MD  naloxone [facility]) nasal spray 4 mg/0.1 mL Spray into the nostril in the case of an overdose 01/04/20   Couture, Cortni S, PA-C  OLANZapine (ZYPREXA) 5 MG tablet Take 1 tablet (5 mg total) by mouth at bedtime. 04/06/22   Harris, Cammy Copa, PA-C  pantoprazole (PROTONIX) 20 MG tablet Take 1 tablet (20 mg total) by mouth daily. 07/10/23   Peter Garter, PA  potassium chloride (KLOR-CON) 10 MEQ tablet Take 1 tablet (10 mEq total) by mouth daily for 5 days. 01/04/20 01/09/20  Couture, Cortni S, PA-C  traZODone (DESYREL) 150 MG tablet Take 75 mg by mouth at bedtime. 01/01/20   [provider]      Allergies    Patient has no known allergies.    Review of Systems   Review of Systems  All other systems reviewed and are negative.   Physical Exam Updated Vital Signs BP 116/72   Pulse (!) 57   Temp 98 F (36.7 C)   Resp 15   Ht 6' (1.829 m)   Wt 121.6 kg   SpO2 97%   BMI 36.35 kg/m  Physical Exam Vitals and nursing note reviewed.  Constitutional:      General: He is not in acute distress.    Appearance: He is well-developed. He is not diaphoretic.  HENT:     Head: Normocephalic and atraumatic.  Cardiovascular:  Rate and Rhythm: Normal rate and regular rhythm.     Heart sounds: No murmur heard.    No friction rub.  Pulmonary:     Effort: Pulmonary effort is normal. No respiratory distress.     Breath sounds: Normal breath sounds. No wheezing or rales.  Abdominal:     General: Bowel sounds are normal. There is no distension.     Palpations: Abdomen is soft.     Tenderness: There is no abdominal tenderness.  Musculoskeletal:        General: Normal range of motion.     Cervical back: Normal range of motion and neck supple.  Skin:    General: Skin is warm and dry.  Neurological:     Mental Status: He is alert and oriented to person, place, and time.     Coordination: Coordination normal.     ED Results /  Procedures / Treatments   Labs (all labs ordered are listed, but only abnormal results are displayed) Labs Reviewed  BASIC METABOLIC PANEL - Abnormal; Notable for the following components:      Result Value   Sodium 134 (*)    Glucose, Bld 163 (*)    All other components within normal limits  CBC  TROPONIN I (HIGH SENSITIVITY)  TROPONIN I (HIGH SENSITIVITY)    EKG ED ECG REPORT   Date: 07/13/2023  Rate: 67  Rhythm: normal sinus rhythm  QRS Axis: normal  Intervals: normal  ST/T Wave abnormalities: normal  Conduction Disutrbances:none  Narrative Interpretation:   Old EKG Reviewed: unchanged  I have personally reviewed the EKG tracing and agree with the computerized printout as noted.   Radiology No results found.  Procedures Procedures    Medications Ordered in ED Medications - No data to display  ED Course/ Medical Decision Making/ A&P  Patient is a 38 year old male presenting with chest discomfort as described in the HPI.  Patient arrives here with stable vital signs and is afebrile.  Physical examination is unremarkable.  Laboratory studies obtained including CBC, metabolic panel, and troponin, all of which are normal.  Patient presenting with symptoms atypical for cardiac pain and negative workup throughout 3 ER visits.  Patient will be referred to cardiology for consideration of a stress test.  Final Clinical Impression(s) / ED Diagnoses Final diagnoses:  None    Rx / DC Orders ED Discharge Orders     None         Geoffery Lyons, MD 07/13/23 0211

## 2023-07-13 NOTE — Discharge Instructions (Signed)
 Take ibuprofen 600 mg every 6 hours as needed for pain.  Follow-up with cardiology.  A referral has been placed to the cardiology clinic on [facility].  They should call you to make arrangements.  If you have not heard from them in the next 1 to 2 days, their contact information has been provided in this discharge summary for you to call and make these arrangements.

## 2023-12-20 ENCOUNTER — Emergency Department (HOSPITAL_BASED_OUTPATIENT_CLINIC_OR_DEPARTMENT_OTHER)
Admission: EM | Admit: 2023-12-20 | Discharge: 2023-12-20 | Discharge status: Against Medical Advice | Payer: MEDICAID | Attending: Emergency Medicine | Admitting: Emergency Medicine

## 2023-12-20 ENCOUNTER — Other Ambulatory Visit: Payer: Self-pay

## 2023-12-20 ENCOUNTER — Encounter (HOSPITAL_BASED_OUTPATIENT_CLINIC_OR_DEPARTMENT_OTHER): Payer: Self-pay | Admitting: Emergency Medicine

## 2023-12-20 DIAGNOSIS — Z5321 Procedure and treatment not carried out due to patient leaving prior to being seen by health care provider: Secondary | ICD-10-CM | POA: Diagnosis not present

## 2023-12-20 DIAGNOSIS — R42 Dizziness and giddiness: Secondary | ICD-10-CM | POA: Diagnosis not present

## 2023-12-20 DIAGNOSIS — E162 Hypoglycemia, unspecified: Secondary | ICD-10-CM | POA: Diagnosis present

## 2023-12-20 LAB — CBG MONITORING, ED: Glucose-Capillary: 116 mg/dL — ABNORMAL HIGH (ref 70–99)

## 2023-12-20 NOTE — ED Triage Notes (Signed)
 Pt reports CBG was 60 at home, was feeling dizzy, lightheaded, ate some yogurt, did not recheck at home; reports s/s have improved since eating

## 2024-02-17 ENCOUNTER — Emergency Department (HOSPITAL_BASED_OUTPATIENT_CLINIC_OR_DEPARTMENT_OTHER)
Admission: EM | Admit: 2024-02-17 | Discharge: 2024-02-17 | Discharge status: Against Medical Advice | Payer: MEDICAID | Attending: Emergency Medicine | Admitting: Emergency Medicine

## 2024-02-17 ENCOUNTER — Other Ambulatory Visit: Payer: Self-pay

## 2024-02-17 DIAGNOSIS — Z5321 Procedure and treatment not carried out due to patient leaving prior to being seen by health care provider: Secondary | ICD-10-CM | POA: Diagnosis not present

## 2024-02-17 DIAGNOSIS — R0602 Shortness of breath: Secondary | ICD-10-CM | POA: Insufficient documentation

## 2024-02-17 DIAGNOSIS — R0789 Other chest pain: Secondary | ICD-10-CM | POA: Insufficient documentation

## 2024-02-17 DIAGNOSIS — F419 Anxiety disorder, unspecified: Secondary | ICD-10-CM | POA: Diagnosis not present

## 2024-02-17 NOTE — ED Triage Notes (Signed)
 Pt reports anxiety and difficulty breathing that started this am. Also endorses chest tightness. Reports stop taking anxiety meds one year ago.

## 2024-02-23 ENCOUNTER — Emergency Department (HOSPITAL_BASED_OUTPATIENT_CLINIC_OR_DEPARTMENT_OTHER)
Admission: EM | Admit: 2024-02-23 | Discharge: 2024-02-23 | Disposition: A | Discharge status: Home / Self Care | Payer: MEDICAID | Attending: Emergency Medicine | Admitting: Emergency Medicine

## 2024-02-23 ENCOUNTER — Other Ambulatory Visit: Payer: Self-pay

## 2024-02-23 DIAGNOSIS — F419 Anxiety disorder, unspecified: Secondary | ICD-10-CM | POA: Insufficient documentation

## 2024-02-23 DIAGNOSIS — G4700 Insomnia, unspecified: Secondary | ICD-10-CM | POA: Diagnosis not present

## 2024-02-23 MED ORDER — LORAZEPAM 1 MG PO TABS
1.0000 mg | ORAL_TABLET | Freq: Two times a day (BID) | ORAL | 0 refills | Status: DC | PRN
Start: 2024-02-23 — End: 2024-03-10

## 2024-02-23 NOTE — ED Provider Notes (Signed)
 [place] [facility] [place] Provider [place]   CSN: [id number] Arrival date & time: 02/23/24  0127     Patient presents with: Anxiety   Perry Li is a 38 y.o. male.   Presents to the emergency department for evaluation of anxiety and insomnia.  Has a history of both.  Patient reports that he was previously on sertraline  and clonazepam .  He reports that he did not feel like those medications were helping him much so he stopped taking them.  In the last few days he has not been able to sleep and this has caused him increased anxiety.  No suicidal or homicidal ideation.       Prior to Admission medications   Medication Sig Start Date End Date Taking? Authorizing Provider  LORazepam  (ATIVAN ) 1 MG tablet Take 1 tablet (1 mg total) by mouth 2 (two) times daily as needed for anxiety or sleep. 02/23/24  Yes Namiah Dunnavant, Lonni PARAS, MD  blood glucose meter kit and supplies KIT Dispense based on patient and insurance preference. Use up to four times daily as directed. (FOR ICD-9 250.00, 250.01). 05/14/18   Shona Terry SAILOR, DO  blood glucose meter kit and supplies Dispense based on patient and insurance preference. Use up to twice daily as directed. (FOR ICD-10 E10.9, E11.9). 05/11/18   Copland, Harlene BROCKS, MD  metFORMIN  (GLUCOPHAGE  XR) 500 MG 24 hr tablet Take 1 tablet (500 mg total) by mouth daily with breakfast. Patient taking differently: Take 500 mg by mouth every other day.  04/16/19   Copland, Harlene BROCKS, MD  naloxone  (NARCAN ) nasal spray 4 mg/0.1 mL Spray into the nostril in the case of an overdose 01/04/20   Couture, Cortni S, PA-C  OLANZapine  (ZYPREXA ) 5 MG tablet Take 1 tablet (5 mg total) by mouth at bedtime. 04/06/22   Harris, Abigail, PA-C  pantoprazole  (PROTONIX ) 20 MG tablet Take 1 tablet (20 mg total) by mouth daily. 07/10/23   Silver Wonda LABOR, PA  potassium chloride  (KLOR-CON ) 10 MEQ tablet Take 1 tablet (10 mEq total) by mouth daily for 5 days. 01/04/20  01/09/20  Couture, Cortni S, PA-C  traZODone  (DESYREL ) 150 MG tablet Take 75 mg by mouth at bedtime. 01/01/20   [provider]    Allergies: Patient has no known allergies.    Review of Systems  Updated Vital Signs BP 128/84 (BP Location: Right Arm)   Pulse 86   Temp 97.7 F (36.5 C) (Oral)   Resp 17   Ht 6' (1.829 m)   Wt 115.7 kg   SpO2 99%   BMI 34.58 kg/m   Physical Exam Vitals and nursing note reviewed.  Constitutional:      General: He is not in acute distress.    Appearance: He is well-developed.  HENT:     Head: Normocephalic and atraumatic.     Mouth/Throat:     Mouth: Mucous membranes are moist.  Eyes:     General: Vision grossly intact. Gaze aligned appropriately.     Extraocular Movements: Extraocular movements intact.     Conjunctiva/sclera: Conjunctivae normal.  Cardiovascular:     Rate and Rhythm: Normal rate and regular rhythm.     Pulses: Normal pulses.     Heart sounds: Normal heart sounds, S1 normal and S2 normal. No murmur heard.    No friction rub. No gallop.  Pulmonary:     Effort: Pulmonary effort is normal. No respiratory distress.     Breath sounds: Normal breath sounds.  Abdominal:     Palpations: Abdomen is soft.     Tenderness: There is no abdominal tenderness. There is no guarding or rebound.     Hernia: No hernia is present.  Musculoskeletal:        General: No swelling.     Cervical back: Full passive range of motion without pain, normal range of motion and neck supple. No pain with movement, spinous process tenderness or muscular tenderness. Normal range of motion.     Right lower leg: No edema.     Left lower leg: No edema.  Skin:    General: Skin is warm and dry.     Capillary Refill: Capillary refill takes less than 2 seconds.     Findings: No ecchymosis, erythema, lesion or wound.  Neurological:     Mental Status: He is alert and oriented to person, place, and time.     GCS: GCS eye subscore is 4. GCS verbal subscore  is 5. GCS motor subscore is 6.     Cranial Nerves: Cranial nerves 2-12 are intact.     Sensory: Sensation is intact.     Motor: Motor function is intact. No weakness or abnormal muscle tone.     Coordination: Coordination is intact.  Psychiatric:        Mood and Affect: Mood normal.        Speech: Speech normal.        Behavior: Behavior normal.     (all labs ordered are listed, but only abnormal results are displayed) Labs Reviewed - No data to display  EKG: None  Radiology: No results found.   Procedures   Medications Ordered in the ED - No data to display                                  Medical Decision Making  Patient with increased anxiety and insomnia.  Will provide limited prescription for Ativan  to be used as needed for both, given resources for outpatient management of anxiety.     Final diagnoses:  Anxiety  Insomnia, unspecified type    ED Discharge Orders          Ordered    LORazepam  (ATIVAN ) 1 MG tablet  2 times daily PRN        02/23/24 0212               Haze Lonni PARAS, MD 02/23/24 [phone]

## 2024-02-23 NOTE — ED Triage Notes (Signed)
 Anxiety has been flaring up x 1 week. Also reports that it is causing insomnia. Not currently taking any prescriptions for it.

## 2024-03-04 ENCOUNTER — Emergency Department (HOSPITAL_COMMUNITY): Payer: MEDICAID

## 2024-03-04 ENCOUNTER — Emergency Department (HOSPITAL_COMMUNITY)
Admission: EM | Admit: 2024-03-04 | Discharge: 2024-03-04 | Discharge status: Against Medical Advice | Payer: MEDICAID | Attending: Emergency Medicine | Admitting: Emergency Medicine

## 2024-03-04 DIAGNOSIS — F419 Anxiety disorder, unspecified: Secondary | ICD-10-CM | POA: Insufficient documentation

## 2024-03-04 DIAGNOSIS — Z5329 Procedure and treatment not carried out because of patient's decision for other reasons: Secondary | ICD-10-CM | POA: Insufficient documentation

## 2024-03-04 DIAGNOSIS — R079 Chest pain, unspecified: Secondary | ICD-10-CM

## 2024-03-04 MED ORDER — LORAZEPAM 1 MG PO TABS: 1.0000 mg | ORAL_TABLET | Freq: Once | ORAL | Status: DC

## 2024-03-04 NOTE — ED Provider Notes (Signed)
 [place] EMERGENCY DEPARTMENT AT [facility] Provider Note   CSN: [id number] Arrival date & time: 03/04/24  1225     Patient presents with: Anxiety   Perry Li is a 38 y.o. male.   38 year old male with history of anxiety and marijuana use presents emergency department with chest tightness and anxiety.  Reports that over the past few weeks has been having anxiety.  An hour and half ago started feeling very anxious and having some chest tightness.  Not exertional.  No diaphoresis or vomiting.  Was seen in the emergency department on 10/23 for similar symptoms and discharged home with Ativan .  Reports that he was smoking marijuana and thinks that it could be related.  No history of MI or cardiac disease otherwise.  Denies any alcohol or recreational drug use aside from marijuana       Prior to Admission medications   Medication Sig Start Date End Date Taking? Authorizing Provider  blood glucose meter kit and supplies KIT Dispense based on patient and insurance preference. Use up to four times daily as directed. (FOR ICD-9 250.00, 250.01). 05/14/18   Shona Terry SAILOR, DO  blood glucose meter kit and supplies Dispense based on patient and insurance preference. Use up to twice daily as directed. (FOR ICD-10 E10.9, E11.9). 05/11/18   Copland, Harlene BROCKS, MD  LORazepam  (ATIVAN ) 1 MG tablet Take 1 tablet (1 mg total) by mouth 2 (two) times daily as needed for anxiety or sleep. 02/23/24   Haze Lonni PARAS, MD  metFORMIN  (GLUCOPHAGE  XR) 500 MG 24 hr tablet Take 1 tablet (500 mg total) by mouth daily with breakfast. Patient taking differently: Take 500 mg by mouth every other day.  04/16/19   Copland, Harlene BROCKS, MD  naloxone  (NARCAN ) nasal spray 4 mg/0.1 mL Spray into the nostril in the case of an overdose 01/04/20   Couture, Cortni S, PA-C  OLANZapine  (ZYPREXA ) 5 MG tablet Take 1 tablet (5 mg total) by mouth at bedtime. 04/06/22   Harris, Abigail, PA-C  pantoprazole  (PROTONIX )  20 MG tablet Take 1 tablet (20 mg total) by mouth daily. 07/10/23   Silver Wonda LABOR, PA  potassium chloride  (KLOR-CON ) 10 MEQ tablet Take 1 tablet (10 mEq total) by mouth daily for 5 days. 01/04/20 01/09/20  Couture, Cortni S, PA-C  traZODone  (DESYREL ) 150 MG tablet Take 75 mg by mouth at bedtime. 01/01/20   [provider]    Allergies: Patient has no known allergies.    Review of Systems  Updated Vital Signs BP (!) 146/91 (BP Location: Right Arm)   Pulse 85   Temp 98 F (36.7 C) (Oral)   Resp 16   Ht 6' (1.829 m)   Wt 115.7 kg   SpO2 100%   BMI 34.58 kg/m   Physical Exam Vitals and nursing note reviewed.  Constitutional:      General: He is not in acute distress.    Appearance: He is well-developed.  HENT:     Head: Normocephalic and atraumatic.     Right Ear: External ear normal.     Left Ear: External ear normal.     Nose: Nose normal.  Eyes:     Extraocular Movements: Extraocular movements intact.     Conjunctiva/sclera: Conjunctivae normal.     Pupils: Pupils are equal, round, and reactive to light.  Cardiovascular:     Rate and Rhythm: Normal rate and regular rhythm.     Heart sounds: Normal heart sounds.  Comments: Chest pain not reproducible.  No overlying chest wall rashes Pulmonary:     Effort: Pulmonary effort is normal. No respiratory distress.     Breath sounds: Normal breath sounds.  Musculoskeletal:     Cervical back: Normal range of motion and neck supple.     Right lower leg: No edema.     Left lower leg: No edema.  Skin:    General: Skin is warm and dry.  Neurological:     Mental Status: He is alert. Mental status is at baseline.  Psychiatric:        Behavior: Behavior normal.     Comments: Anxious appearing     (all labs ordered are listed, but only abnormal results are displayed) Labs Reviewed  BASIC METABOLIC PANEL WITH GFR  CBC  CBG MONITORING, ED  TROPONIN T, HIGH SENSITIVITY    EKG: None  Radiology: No results  found.   Procedures   Medications Ordered in the ED  LORazepam  (ATIVAN ) tablet 1 mg (has no administration in time range)                                    Medical Decision Making Amount and/or Complexity of Data Reviewed Labs: ordered. Radiology: ordered.  Risk Prescription drug management.   Edyn Li is a 38 year old male with history of anxiety and marijuana use who presents emergency department chest tightness and anxiety  Initial Ddx:  Anxiety, marijuana use, MI, PE, dissection, pneumothorax  MDM/Course:  Patient presents emergency department with anxiety and chest tightness.  Has been seen in the emergency department for this before most recently on 10/23.  He was discharged home with Ativan .  On exam he is not in acute distress.  Vital signs are reappearing.  He is somewhat anxious appearing.  Heart and lung sounds are reassuring.  Radial pulses 2+ bilaterally.  Did order him for chest x-ray, lab work including troponin, and EKG.  I did ask him about if he was using the Ativan  or not and he says that he ran out of it recently.  I explained to him that is not a good medication to be on long-term for anxiety and that we would have to look for other treatments for his anxiety if he was able to be discharged.  He then stated so you guys are good for nothing then?  And stated that he wanted to leave AGAINST MEDICAL ADVICE.  Did explain to him that he could have a life-threatening etiology going on such as an MI or any of the above diagnoses but he stated that he wished to leave.  Suspect that his symptoms are likely due to his anxiety.  Counseled him to stop smoking marijuana as this could be making it worse.  Patient left without discharge papers   This patient presents to the ED for concern of complaints listed in HPI, this involves an extensive number of treatment options, and is a complaint that carries with it a high risk of complications and morbidity. Disposition  including potential need for admission considered.   Dispo: DC Home. Return precautions discussed including, but not limited to, those listed in the AVS. Allowed pt time to ask questions which were answered fully prior to dc.  Records reviewed ED Visit Notes I have reviewed the patients home medications and made adjustments as needed  Portions of this note were generated with Dragon dictation software. Dictation errors  may occur despite best attempts at proofreading.     Final diagnoses:  Anxiety  Chest pain, unspecified type    ED Discharge Orders     None          Yolande Lamar BROCKS, MD 03/04/24 1401

## 2024-03-04 NOTE — ED Triage Notes (Signed)
 Patient reports anxiety starting 1 hour ago with chest tightness Says it could be because of traffic Reports history of anxiety, hasn't been on medications in a while but anxiety still occurs at times  Denies pain in triage

## 2024-03-10 ENCOUNTER — Emergency Department (HOSPITAL_BASED_OUTPATIENT_CLINIC_OR_DEPARTMENT_OTHER)
Admission: EM | Admit: 2024-03-10 | Discharge: 2024-03-10 | Disposition: A | Discharge status: Home / Self Care | Payer: MEDICAID | Attending: Emergency Medicine | Admitting: Emergency Medicine

## 2024-03-10 ENCOUNTER — Encounter (HOSPITAL_BASED_OUTPATIENT_CLINIC_OR_DEPARTMENT_OTHER): Payer: Self-pay | Admitting: *Deleted

## 2024-03-10 ENCOUNTER — Other Ambulatory Visit: Payer: Self-pay

## 2024-03-10 DIAGNOSIS — F419 Anxiety disorder, unspecified: Secondary | ICD-10-CM | POA: Insufficient documentation

## 2024-03-10 MED ORDER — LORAZEPAM 1 MG PO TABS: 1.0000 mg | ORAL_TABLET | Freq: Three times a day (TID) | ORAL | 0 refills | Status: AC | PRN

## 2024-03-10 NOTE — ED Provider Notes (Signed)
 [place] EMERGENCY DEPARTMENT AT [facility] [place] Provider Note   CSN: [id number] Arrival date & time: 03/10/24  9796     Patient presents with: Anxiety   Perry Li is a 38 y.o. male.   Patient is a 37 year old male with history of anxiety presenting with complaints of anxiety.  He states he is stressed and feels anxious.  He denies any chest pain or difficulty breathing.  He has been given Ativan  in the past which does seem to help.       Prior to Admission medications   Medication Sig Start Date End Date Taking? Authorizing Provider  blood glucose meter kit and supplies KIT Dispense based on patient and insurance preference. Use up to four times daily as directed. (FOR ICD-9 250.00, 250.01). 05/14/18   Shona Terry SAILOR, DO  blood glucose meter kit and supplies Dispense based on patient and insurance preference. Use up to twice daily as directed. (FOR ICD-10 E10.9, E11.9). 05/11/18   Copland, Harlene BROCKS, MD  LORazepam  (ATIVAN ) 1 MG tablet Take 1 tablet (1 mg total) by mouth 2 (two) times daily as needed for anxiety or sleep. 02/23/24   Haze Lonni PARAS, MD  metFORMIN  (GLUCOPHAGE  XR) 500 MG 24 hr tablet Take 1 tablet (500 mg total) by mouth daily with breakfast. Patient taking differently: Take 500 mg by mouth every other day.  04/16/19   Copland, Harlene BROCKS, MD  naloxone  (NARCAN ) nasal spray 4 mg/0.1 mL Spray into the nostril in the case of an overdose 01/04/20   Couture, Cortni S, PA-C  OLANZapine  (ZYPREXA ) 5 MG tablet Take 1 tablet (5 mg total) by mouth at bedtime. 04/06/22   Harris, Abigail, PA-C  pantoprazole  (PROTONIX ) 20 MG tablet Take 1 tablet (20 mg total) by mouth daily. 07/10/23   Silver Wonda LABOR, PA  potassium chloride  (KLOR-CON ) 10 MEQ tablet Take 1 tablet (10 mEq total) by mouth daily for 5 days. 01/04/20 01/09/20  Couture, Cortni S, PA-C  traZODone  (DESYREL ) 150 MG tablet Take 75 mg by mouth at bedtime. 01/01/20   [provider]    Allergies: Patient  has no active allergies.    Review of Systems  All other systems reviewed and are negative.   Updated Vital Signs BP (!) 144/90 (BP Location: Right Arm)   Pulse 84   Temp 99 F (37.2 C) (Oral)   Resp 18   Ht 6' (1.829 m)   Wt 113.4 kg   SpO2 99%   BMI 33.91 kg/m   Physical Exam Vitals and nursing note reviewed.  Constitutional:      Appearance: Normal appearance.  Cardiovascular:     Rate and Rhythm: Normal rate and regular rhythm.  Pulmonary:     Effort: Pulmonary effort is normal.     Breath sounds: No wheezing or rhonchi.  Neurological:     Mental Status: He is alert and oriented to person, place, and time.     (all labs ordered are listed, but only abnormal results are displayed) Labs Reviewed - No data to display  EKG: None  Radiology: No results found.   Procedures   Medications Ordered in the ED - No data to display                                  Medical Decision Making  Patient presenting with anxiety as described in the HPI.  Physical examination is unremarkable and vital signs  are stable.  I have agreed to provide a one-time prescription for a small quantity of Ativan .  Patient strongly advised that he needs to follow-up with his primary doctor to discuss his anxiety issues and that I will not provide further prescriptions for anxiety medications.     Final diagnoses:  None    ED Discharge Orders     None          Geroldine Berg, MD 03/10/24 [phone]

## 2024-03-10 NOTE — Discharge Instructions (Addendum)
 Begin taking Ativan  as prescribed.  Follow-up with your primary doctor to discuss your anxiety issues.  We can no longer prescribe these medications here in the ER and prescriptions need to come from your primary doctor.

## 2024-03-10 NOTE — ED Triage Notes (Signed)
 Pt present with concerns of anxiety. Onset: 30 minutes ago. Pt endorses shortness of breath. Denies chest pain. Hx: Anxiety. Has not made appt  with PCP oin reference to these symptoms.  Pt req. medication to help with this episode.
